# Patient Record
Sex: Female | Born: 1940
Health system: Southern US, Community
[De-identification: ages and names within clinical notes are randomized; demographics above are authoritative.]

## PROBLEM LIST (undated history)

## (undated) DIAGNOSIS — I350 Nonrheumatic aortic (valve) stenosis: Secondary | ICD-10-CM

## (undated) DIAGNOSIS — E1169 Type 2 diabetes mellitus with other specified complication: Secondary | ICD-10-CM

## (undated) DIAGNOSIS — R609 Edema, unspecified: Secondary | ICD-10-CM

## (undated) DIAGNOSIS — Z853 Personal history of malignant neoplasm of breast: Secondary | ICD-10-CM

## (undated) DIAGNOSIS — J349 Unspecified disorder of nose and nasal sinuses: Secondary | ICD-10-CM

## (undated) DIAGNOSIS — J45909 Unspecified asthma, uncomplicated: Secondary | ICD-10-CM

## (undated) DIAGNOSIS — I050 Rheumatic mitral stenosis: Secondary | ICD-10-CM

## (undated) DIAGNOSIS — J449 Chronic obstructive pulmonary disease, unspecified: Secondary | ICD-10-CM

## (undated) DIAGNOSIS — E785 Hyperlipidemia, unspecified: Secondary | ICD-10-CM

## (undated) DIAGNOSIS — H9190 Unspecified hearing loss, unspecified ear: Secondary | ICD-10-CM

## (undated) DIAGNOSIS — M791 Myalgia, unspecified site: Secondary | ICD-10-CM

## (undated) DIAGNOSIS — I129 Hypertensive chronic kidney disease with stage 1 through stage 4 chronic kidney disease, or unspecified chronic kidney disease: Secondary | ICD-10-CM

## (undated) DIAGNOSIS — I5030 Unspecified diastolic (congestive) heart failure: Secondary | ICD-10-CM

## (undated) DIAGNOSIS — E1142 Type 2 diabetes mellitus with diabetic polyneuropathy: Secondary | ICD-10-CM

## (undated) DIAGNOSIS — E039 Hypothyroidism, unspecified: Secondary | ICD-10-CM

## (undated) DIAGNOSIS — E669 Obesity, unspecified: Secondary | ICD-10-CM

## (undated) DIAGNOSIS — I10 Essential (primary) hypertension: Secondary | ICD-10-CM

## (undated) DIAGNOSIS — N183 Hypertensive chronic kidney disease with stage 1 through stage 4 chronic kidney disease, or unspecified chronic kidney disease: Secondary | ICD-10-CM

## (undated) DIAGNOSIS — M1990 Unspecified osteoarthritis, unspecified site: Secondary | ICD-10-CM

## (undated) HISTORY — DX: Rheumatic mitral stenosis: I05.0

## (undated) HISTORY — DX: Nonrheumatic aortic (valve) stenosis: I35.0

## (undated) HISTORY — DX: Unspecified disorder of nose and nasal sinuses: J34.9

## (undated) HISTORY — DX: Unspecified hearing loss, unspecified ear: H91.90

## (undated) HISTORY — DX: Edema, unspecified: R60.9

## (undated) HISTORY — DX: Obesity, unspecified: E66.9

## (undated) HISTORY — DX: Type 2 diabetes mellitus with diabetic polyneuropathy: E11.42

## (undated) HISTORY — DX: Unspecified osteoarthritis, unspecified site: M19.90

## (undated) HISTORY — DX: Myalgia, unspecified site: M79.10

## (undated) HISTORY — DX: Hypertensive chronic kidney disease with stage 1 through stage 4 chronic kidney disease, or unspecified chronic kidney disease: N18.30

## (undated) HISTORY — DX: Hypertensive chronic kidney disease with stage 1 through stage 4 chronic kidney disease, or unspecified chronic kidney disease: I12.9

## (undated) HISTORY — DX: Morbid (severe) obesity due to excess calories: E66.01

## (undated) HISTORY — DX: Hyperlipidemia, unspecified: E78.5

## (undated) HISTORY — DX: Chronic obstructive pulmonary disease, unspecified: J44.9

## (undated) HISTORY — DX: Personal history of malignant neoplasm of breast: Z85.3

## (undated) HISTORY — DX: Essential (primary) hypertension: I10

## (undated) HISTORY — PX: TONSILLECTOMY: SUR1361

## (undated) HISTORY — DX: Unspecified diastolic (congestive) heart failure: I50.30

## (undated) HISTORY — PX: TOTAL KNEE ARTHROPLASTY: SHX125

## (undated) HISTORY — DX: Hypothyroidism, unspecified: E03.9

## (undated) HISTORY — DX: Type 2 diabetes mellitus with other specified complication: E11.69

## (undated) HISTORY — DX: Unspecified asthma, uncomplicated: J45.909

---

## 1978-09-30 HISTORY — PX: PARTIAL HYSTERECTOMY: SHX80

## 2001-12-30 ENCOUNTER — Ambulatory Visit (HOSPITAL_COMMUNITY): Admission: RE | Admit: 2001-12-30 | Discharge: 2001-12-30 | Payer: Self-pay | Admitting: Family Medicine

## 2001-12-30 ENCOUNTER — Encounter: Payer: Self-pay | Admitting: Family Medicine

## 2005-10-15 ENCOUNTER — Inpatient Hospital Stay (HOSPITAL_COMMUNITY): Admission: RE | Admit: 2005-10-15 | Discharge: 2005-10-18 | Payer: Self-pay | Admitting: Orthopedic Surgery

## 2009-08-15 ENCOUNTER — Ambulatory Visit: Payer: Self-pay | Admitting: Cardiovascular Disease

## 2009-08-21 ENCOUNTER — Telehealth: Payer: Self-pay | Admitting: Cardiovascular Disease

## 2009-08-22 ENCOUNTER — Inpatient Hospital Stay (HOSPITAL_COMMUNITY): Admission: RE | Admit: 2009-08-22 | Discharge: 2009-08-24 | Payer: Self-pay | Admitting: Orthopedic Surgery

## 2009-08-28 ENCOUNTER — Telehealth: Payer: Self-pay | Admitting: Cardiovascular Disease

## 2009-12-14 ENCOUNTER — Ambulatory Visit: Payer: Self-pay | Admitting: Cardiovascular Disease

## 2010-01-23 ENCOUNTER — Encounter: Admission: RE | Admit: 2010-01-23 | Discharge: 2010-01-23 | Payer: Self-pay | Admitting: Orthopedic Surgery

## 2010-06-01 ENCOUNTER — Encounter: Admission: RE | Admit: 2010-06-01 | Discharge: 2010-06-01 | Payer: Self-pay | Admitting: Orthopedic Surgery

## 2010-06-12 ENCOUNTER — Ambulatory Visit: Payer: Self-pay | Admitting: Cardiovascular Disease

## 2010-07-20 ENCOUNTER — Telehealth (INDEPENDENT_AMBULATORY_CARE_PROVIDER_SITE_OTHER): Payer: Self-pay | Admitting: *Deleted

## 2010-07-31 ENCOUNTER — Inpatient Hospital Stay (HOSPITAL_COMMUNITY): Admission: RE | Admit: 2010-07-31 | Discharge: 2010-08-03 | Payer: Self-pay | Admitting: Orthopedic Surgery

## 2010-11-01 NOTE — Progress Notes (Signed)
Summary: Records Request  Faxed OV & EKG to [facility] at [facility] ([phone]). Debby Freiberg  July 20, 2010 2:04 PM

## 2010-12-11 LAB — BASIC METABOLIC PANEL
BUN: 9 mg/dL (ref 6–23)
Calcium: 7.8 mg/dL — ABNORMAL LOW (ref 8.4–10.5)
Calcium: 7.9 mg/dL — ABNORMAL LOW (ref 8.4–10.5)
Creatinine, Ser: 0.91 mg/dL (ref 0.4–1.2)
GFR calc Af Amer: >60 mL/min (ref >60)
GFR calc non Af Amer: 56 mL/min — ABNORMAL LOW (ref >60)
GFR calc non Af Amer: >60 mL/min (ref >60)
Glucose, Bld: 128 mg/dL — ABNORMAL HIGH (ref 70–99)
Glucose, Bld: 131 mg/dL — ABNORMAL HIGH (ref 70–99)
Sodium: 135 mEq/L (ref 135–145)

## 2010-12-11 LAB — URINALYSIS, ROUTINE W REFLEX MICROSCOPIC
Bilirubin Urine: NEGATIVE
Glucose, UA: NEGATIVE mg/dL
Hgb urine dipstick: NEGATIVE
Specific Gravity, Urine: 1.025 (ref 1.005–1.030)
Urobilinogen, UA: 0.2 mg/dL (ref 0.0–1.0)
pH: 5 (ref 5.0–8.0)

## 2010-12-11 LAB — URINE MICROSCOPIC-ADD ON

## 2010-12-11 LAB — CBC
Hemoglobin: 10.6 g/dL — ABNORMAL LOW (ref 12.0–15.0)
MCH: 32.9 pg (ref 26.0–34.0)
MCHC: 33.9 g/dL (ref 30.0–36.0)
MCHC: 34.3 g/dL (ref 30.0–36.0)
Platelets: 152 10*3/uL (ref 150–400)
RDW: 13.1 % (ref 11.5–15.5)

## 2010-12-11 LAB — TYPE AND SCREEN
ABO/RH(D): A POS
Antibody Screen: NEGATIVE

## 2010-12-11 LAB — URINE CULTURE: Special Requests: NEGATIVE

## 2010-12-12 LAB — URINALYSIS, ROUTINE W REFLEX MICROSCOPIC
Bilirubin Urine: NEGATIVE
Glucose, UA: NEGATIVE mg/dL
Ketones, ur: NEGATIVE mg/dL
Protein, ur: NEGATIVE mg/dL
pH: 6 (ref 5.0–8.0)

## 2010-12-12 LAB — CBC
MCHC: 33.7 g/dL (ref 30.0–36.0)
Platelets: 231 10*3/uL (ref 150–400)
RDW: 13.4 % (ref 11.5–15.5)
WBC: 5.9 10*3/uL (ref 4.0–10.5)

## 2010-12-12 LAB — APTT: aPTT: 32 seconds (ref 24–37)

## 2010-12-12 LAB — URINE MICROSCOPIC-ADD ON

## 2010-12-12 LAB — DIFFERENTIAL
Basophils Absolute: 0.1 10*3/uL (ref 0.0–0.1)
Basophils Relative: 1 % (ref 0–1)
Lymphocytes Relative: 38 % (ref 12–46)
Monocytes Absolute: 0.7 10*3/uL (ref 0.1–1.0)
Neutro Abs: 2.5 10*3/uL (ref 1.7–7.7)

## 2010-12-12 LAB — BASIC METABOLIC PANEL
BUN: 18 mg/dL (ref 6–23)
Calcium: 9.2 mg/dL (ref 8.4–10.5)
Creatinine, Ser: 1.16 mg/dL (ref 0.4–1.2)
GFR calc non Af Amer: 47 mL/min — ABNORMAL LOW (ref >60)
Glucose, Bld: 140 mg/dL — ABNORMAL HIGH (ref 70–99)

## 2010-12-12 LAB — SURGICAL PCR SCREEN: MRSA, PCR: NEGATIVE

## 2010-12-12 LAB — PROTIME-INR: Prothrombin Time: 13.3 seconds (ref 11.6–15.2)

## 2011-01-02 LAB — URINALYSIS, ROUTINE W REFLEX MICROSCOPIC
Bilirubin Urine: NEGATIVE
Glucose, UA: NEGATIVE mg/dL
Hgb urine dipstick: NEGATIVE
Ketones, ur: NEGATIVE mg/dL
Protein, ur: NEGATIVE mg/dL
Urobilinogen, UA: 0.2 mg/dL (ref 0.0–1.0)

## 2011-01-02 LAB — TYPE AND SCREEN

## 2011-01-02 LAB — CBC
HCT: 32.9 % — ABNORMAL LOW (ref 36.0–46.0)
HCT: 34.5 % — ABNORMAL LOW (ref 36.0–46.0)
Hemoglobin: 11.4 g/dL — ABNORMAL LOW (ref 12.0–15.0)
MCHC: 34 g/dL (ref 30.0–36.0)
MCV: 97.5 fL (ref 78.0–100.0)
MCV: 97.6 fL (ref 78.0–100.0)
Platelets: 155 10*3/uL (ref 150–400)
RBC: 3.54 MIL/uL — ABNORMAL LOW (ref 3.87–5.11)
RDW: 12.6 % (ref 11.5–15.5)
WBC: 8.1 10*3/uL (ref 4.0–10.5)

## 2011-01-02 LAB — BASIC METABOLIC PANEL
BUN: 7 mg/dL (ref 6–23)
CO2: 28 mEq/L (ref 19–32)
Chloride: 100 mEq/L (ref 96–112)
Chloride: 101 mEq/L (ref 96–112)
Creatinine, Ser: 0.8 mg/dL (ref 0.4–1.2)
GFR calc Af Amer: >60 mL/min (ref >60)
Glucose, Bld: 133 mg/dL — ABNORMAL HIGH (ref 70–99)
Potassium: 4 mEq/L (ref 3.5–5.1)
Potassium: 4.3 mEq/L (ref 3.5–5.1)
Sodium: 135 mEq/L (ref 135–145)

## 2011-01-02 LAB — PROTIME-INR
INR: 1.01 (ref 0.00–1.49)
Prothrombin Time: 13.2 seconds (ref 11.6–15.2)

## 2011-02-07 HISTORY — PX: TOTAL KNEE ARTHROPLASTY: SHX125

## 2011-02-12 NOTE — Assessment & Plan Note (Signed)
[facility]                        [place] [facility] OFFICE NOTE   NAME:Hoard, [name] [name]                   MRN:          [id number]  DATE:12/14/2009                            DOB:          [birth year]    PROBLEM LIST:  1. Hypertension.  2. Hyperlipidemia.  3. Obesity.  4. Arthritis status post bilateral knee arthroplasties.   INTERVAL HISTORY:  The patient states since her last visit, she has  gotten along fairly well.  She has developed some right hip pain and has  been told she may need a hip replacement; however, they are trying  injections first.  She denies any chest pain or chest discomfort.  She  has been compliant with her blood pressure medicines and has been  attempting to make dietary modifications, although she states she has  gained back some of the weight that she initially lost.  She has not  been taking any antiinflammatory medications as she is concerned about  the fact it may have on her stomach.  She takes Tylenol p.r.n. for pain.   PHYSICAL EXAMINATION:  VITAL SIGNS:  Today, her blood pressure is  143/81, pulse is 77, saturating 96% on room air.  She weighs 204 pounds  which is 9 pounds more than she weighed in December and the same that  she weighed in November.  GENERAL:  She is in no acute distress.  HEENT:  Normocephalic, atraumatic.  NECK:  Supple.  There is no JVD in the seated position.  There are no  carotid bruits.  HEART:  Regular rate and rhythm without murmur, rub, or gallop.  LUNGS:  Clear bilaterally.  EXTREMITIES:  Trace bilateral edema.  SKIN:  Warm and dry.   The review of the patient's labs; her BMP is completely within normal  limits including a sodium of 4.0.  Her lipid profile; a total  cholesterol of 229, HDL of 46, triglycerides 197, LDL 143.   ASSESSMENT AND PLAN:  1. Hypertension.  The patient's blood pressure is borderline today.      She should continue on her current dose of lisinopril  and      hydrochlorothiazide.  She signed up for [facility] course of plans on      taking a water aerobics starting in the very near future.  We      encouraged her to increase physical activity and attempt to      decrease weight loss.  If her blood pressure remains elevated in      the coming months, we would increase lisinopril/hydrochlorothiazide      that she is taking.  2. Hyperlipidemia.  Her LDL has greatly improved from 180 to 143 with      dietary modification.  I can gradually ask her to continue with      dietary modification.  The patient is verge to taking any medicines      that are not absolutely needed and there is no clear indication for      statin therapy at this time.  3. Other health maintenance issues.  The patient has not had a  mammography, screening for colon cancer, or a TSH.  We again      recommend to see her primary care physician.  She states she will      attempt to contact Dr. Abner Greenspan for this.  In the interim, the      patient may      take aspirin 81 mg daily and continue on the      lisinopril/hydrochlorothiazide at 20/12.5 mg daily.  As she plans      to follow up with primary care in the upcoming months, we will see      her back in 6 months unless problem were to arise in the interim.     Brayton El, MD  Electronically Signed    SGA/MedQ  DD: 12/14/2009  DT: 12/15/2009  Job #: [id number]

## 2011-02-12 NOTE — Assessment & Plan Note (Signed)
[facility]                        [place] [facility] OFFICE NOTE   NAME:[name], [name] STRAHM                   MRN:          [id number]  DATE:06/12/2010                            DOB:          05/02/1941    PROBLEMS LIST:  1. Abnormal ECG with hypertensive heart disease.  2. Hypertension.  3. Hyperlipidemia.  4. Obesity.  5. Arthritis status post bilateral knee arthroplasties.   INTERVAL HISTORY:  The patient was seen in the past by Dr. Freida [name] for  preoperative cardiovascular evaluation and management of her  hypertension.  She was started on lisinopril, hydrochlorothiazide with  improvement in her blood pressure.  She had an echocardiogram done which  showed normal LV systolic function, mild left ventricular hypertrophy,  and mild diastolic dysfunction.  The patient has been doing very well  from a cardiac standpoint with no reported chest pain, dyspnea,  palpitations, dizziness, presyncope, or syncope.  Her blood pressure was  running slightly high during the last visit.  The patient has not been  doing much physical exercise.   MEDICATIONS:  Include lisinopril/hydrochlorothiazide 20/12.5 mg once  daily.   ALLERGIES:  PENICILLIN.   PHYSICAL EXAMINATION:  VITAL SIGNS:  Weight is 205.6 pounds, blood  pressure is 154/86, pulse is 77, oxygen saturation is 95% on room air.  NECK:  Reveals no JVD or carotid bruits.  LUNGS:  Clear to auscultation.  HEART:  Regular rate and rhythm with no gallops or murmurs.  ABDOMEN:  Benign, nontender, nondistended.  EXTREMITIES:  No clubbing, cyanosis, or edema.   IMPRESSION:  1. Hypertension which seems to be not at target at this time.  Even      during her last visit, her blood pressure was borderline elevated.      Due to that, I recommended increasing the dose of      lisinopril/hydrochlorothiazide to 1 tablet twice daily.  The      patient otherwise is not having any chest pain or dyspnea or any  other cardiac symptoms.  2. Hyperlipidemia.  The patient is not on any medications, but      supposed to be following lifestyle changes.  Her most recent LDL in      March 2011 was 143, but she did have more recent labs done with Dr.      Yetta [name].  Given that, the patient is not having any active cardiac issues at this  time.  I asked her to follow  up with her primary care physician, Dr. Yetta [name].  She can follow up with  [place] as needed.  I will be more than happy to see her again for any other  active medical or cardiac issue.     Lorine Bears, MD  Electronically Signed    MA/MedQ  DD: 06/12/2010  DT: 06/13/2010  Job #: [id number]

## 2011-02-12 NOTE — Letter (Signed)
August 21, 2009    Madlyn Frankel. Charlann Boxer, M.D.  [facility]  [address]  [address]  [place], [place] [address]   RE:  [name] [name], [name] [name]  MRN:  [id number]  /  DOB:  Dec 13, 1940   Dear Dr. Charlann Boxer:   I had the pleasure of seeing your patient, [name] [name], in  cardiology clinic for preoperative evaluation.  We have performed an  echocardiogram which shows that her ejection fraction is within normal  limits without any significant valvular abnormalities.  We have also  started her on antihypertensive therapy.  I feel that she is at low risk  to proceed with orthopedic surgery on November 23.  Please contact my  office if I can be of any further assistance.   Sincerely,    Sincerely,      Brayton El, MD  Electronically Signed    SGA/MedQ  DD: 08/21/2009  DT: 08/21/2009  Job #: [id number]

## 2011-02-12 NOTE — Assessment & Plan Note (Signed)
HEALTHCARE                        [place] CARDIOLOGY OFFICE NOTE   NAME:[name], [name] [name] [name]                   MRN:          [id number]  DATE:08/15/2009                            DOB:          [birth year]    CHIEF COMPLAINT:  Preop evaluation for knee arthroplasty.   HISTORY OF PRESENT ILLNESS:  Ms. [name] [name] is a 70 year old white female  with past medical history significant for asthma as a child and vertigo  that has not reoccurred and a recent left femur fracture that has  worsened her left knee pain to the point where she is now requiring left  knee arthroplasty.  The patient states that prior to her femur fracture  several months ago, she was pretty active.  She has worked in a bakery  at [employer] and does significant amount of physical activity on a daily  basis.  She denies any episodes of chest discomfort, dyspnea on exertion  or lower extremity edema.  She does endorse fatigue, but this has been  chronic in nature.  Since her leg injury, the patient has not been as  active, but also has not had any other new symptoms other than the leg  pain.  The patient states that she takes Aleve and Advil for the leg  pain.   PAST MEDICAL HISTORY:  As above in HPI.   SOCIAL HISTORY:  No tobacco, no alcohol.   FAMILY HISTORY:  Negative for premature coronary artery disease.   ALLERGIES:  PENICILLIN causes swelling and hives.   MEDICATIONS:  Advil and Aleve p.r.n.  She also takes Cloro-Trimeton for  allergies.   REVIEW OF SYSTEMS:  As in HPI.  In addition, the patient endorses  chronic left lower extremity pain.  She also awakened several times at  night to pass urine.   HISTORY OF PRESENT ILLNESS:  Otherwise negative.   PHYSICAL EXAMINATION:  VITAL SIGNS:  Blood pressure 157/85, pulse 79,  she weighs 204 pounds, she is sating 95% on room air.  GENERAL:  No acute distress.  HEENT:  Nonfocal.  Normocephalic, atraumatic.  NECK:  Supple.  No JVD.   No carotid bruits.  HEART:  Regular rate and rhythm without murmur, rub or gallop.  LUNGS:  Clear bilaterally.  ABDOMEN:  Soft, nontender, nondistended.  There are no bruits  auscultated.  EXTREMITIES:  Without edema.  SKIN:  Warm and dry.  NEURO:  Nonfocal.  PSYCHIATRIC:  The patient is appropriate with normal levels of insight.   EKG taken today independently interpreted by myself demonstrates normal  sinus rhythm with left ventricular hypertrophy and ST and T-wave  abnormalities, most likely secondary to left ventricular hypertrophy.  There is also nonspecific interventricular conduction delay that may  also be secondary to left ventricular hypertrophy.  There is also  evidence of left atrial enlargement.   ASSESSMENT:  A 70 year old white female with uncontrolled hypertension  and left ventricular hypertrophy who is not having any symptoms  consistent with obstructive coronary artery disease.  The patient's  uncontrolled hypertension has likely precipitated left ventricular  hypertrophy.   PLAN:  Today, we will order a transthoracic echocardiogram  in order to  evaluate the patient's left ventricular systolic function and to confirm  the left ventricular hypertrophy.  We will place her on HCTZ/lisinopril  12.5/20 mg daily.  We will check a CMP, CBC and fasting lipids today.  I  will follow up with the patient later in the week to reevaluate her  blood pressure.  Should her blood pressure be within an acceptable range  and her ejection fraction be normal, she could proceed with her surgery  which is currently scheduled for the 23 November and be at low  cardiovascular risk for the surgery.  If the ejection fraction is  abnormal, an ischemia evaluation would be in order.      Brayton El, MD  Electronically Signed    SGA/MedQ  DD: 08/15/2009  DT: 08/16/2009  Job #: [id number] [id number]

## 2011-02-15 NOTE — Assessment & Plan Note (Signed)
[facility]                        [place] [facility]IOLOG[facility] OFFICE NOTE   NAME:[name], [name]                   MRN:          [id number]  DATE:09/13/2009                            DOB:          18-Apr-1941    PROBLEM LIST:  1. Hypertension.  2. Hyperlipidemia.  3. Arthritis, status post bilateral knee arthroplasties.   INTERVAL HISTORY:  The patient states since her last visit she had her  left knee operated on.  The surgery was uneventful on her postop course,  has not had any major complications.  The patient does endorse a  significant discomfort in that left knee, but states that it is getting  better and is fairly well controlled with pain medicine.  She is  prescribed.  She also states that the knee has been warm for quite  sometime, but her physicians are aware of it and she has been afebrile.  She has been compliant with her medications and is having her blood  pressure checked on the daily basis at home.  She states that a systolic  number is usually in the 130s and sometimes in the 140s and the  diastolic murmur is usually in the 70s or 80s.  The patient currently  does not have a primary care physician, but plans on seeking one now.   REVIEW OF SYSTEMS:  As above.   PHYSICAL EXAMINATION:  VITAL SIGNS:  Blood pressure 136/75, pulse 81,  weight 195 pounds, sating 96% on room air.  GENERAL:  She is in no acute distress.  HEENT:  Normocephalic, atraumatic.  HEART:  Regular rate and rhythm without murmur, rub, or gallop.  LUNGS:  Clear bilaterally.  ABDOMEN:  Soft, nontender, nondistended.  EXTREMITIES:  Without edema.  Left knee is mildly swollen with mild  erythema and warmth it appears to be full range of motion of the left  knee.  I reviewed the patient's echocardiogram since her last visit  which showed an ejection fraction of 55-60% with a mild tricuspid  regurgitation and evidence of impaired diastolic relaxation.   LABORATORY  DATA:  Reviewed since last visit, sodium 140, potassium 4.5,  chloride 101, CO2 30, BUN 14, creatinine 0.77, glucose 107.  Her LFTs  were within normal limits.  Total cholesterol 265, HDL 47, triglycerides  193, LDL 180.  CBC within normal limits.  The white count is 7,  hemoglobin is 16, hematocrit of 46.5, and a platelet count of 203.   ASSESSMENT AND PLAN:  1. Hypertension.  The patient's blood pressure is under good control.      We will continue lisinopril/HCTZ 20/12.5 mg daily.  2. Hyperlipidemia.  The patient's LDL is not at goal.  The patient      states she has lost 10 pounds in the last month and will continue      to make dietary improvements.  We will not institute medical      therapy at this time, but several recheck a fasting lipid profile      at her next office appointment in approximately 3 months.  3. Elevated glucose.  Her fasting glucose was 107 placing  her in the      prediabetic range.  We recommend dietary modification and for her      to seek at a primary care Brizeyda Holtmeyer for continued follow up of the      elevated glucose.  4. Other health maintenance issues.  The patient has not had a recent      mammography, screening for colon CA, or TSH.  The patient states      that she will make an appointment with primary care physician      regarding these issues.  We will see the patient back in 3 months'      time to further address her hyperlipidemia and monitor her blood      pressure.     Brayton El, MD  Electronically Signed    SGA/MedQ  DD: 09/13/2009  DT: 09/14/2009  Job #: [id number]

## 2011-02-15 NOTE — Discharge Summary (Signed)
[name] [name], [name] [name]            ACCOUNT NO.:  [id number]   MEDICAL RECORD NO.:  [id number]           PATIENT TYPE:   LOCATION:  1519                         FACILITY[facility]  [facility]   PHYSICIAN:  Madlyn Frankel. Charlann Boxer, M.D.  DATE OF BIRTH:  [birth year]   DATE OF ADMISSION:  10/15/2005  DATE OF DISCHARGE:  10/18/2005                                 DISCHARGE SUMMARY   ADMISSION DIAGNOSES:  1.  Severe osteoarthritis of the right knee.  2.  Hypothyroidism.  3.  Hypertension.  4.  History of asthma.  5.  Reflux.   DISCHARGE DIAGNOSES:  1.  Severe osteoarthritis of the right knee.  2.  Hypothyroidism.  3.  Hypertension.  4.  History of asthma.  5.  Reflux.  6.  Mild postoperative anemia.   OPERATION:  On October 15, 2005 the patient underwent right total knee  replacement arthroplasty utilizing DePuy rotating platform with polyethylene  posterior stabilized liner and patella button, all 3 components cemented,  Dooley L. Idolina Primer, P.A. assisted.   BRIEF HISTORY:  This 70 year old lady had been seen by [place] for bilateral knee  pain secondary to end-stage osteoarthritis.  Conservative measures with  alternating weightbearing as well as anti-inflammatories and pain  medications have not helped her discomfort.  After much discussion including  the risks and benefits of surgery and considering the findings on x-ray with  deterioration of the joint, it was decided to go ahead with total knee  replacement arthroplasty on the right knee as it is more symptomatic.  We  plan to do a left knee in the not too distant future.   COURSE IN THE HOSPITAL:  The patient tolerated the surgical procedure quite  well.  She entered into the total knee protocol with eagerness.  We were  able to wean her off the PCA nearly immediately using p.o. analgesics for  her discomfort.  She was placed on Coumadin protocol postoperatively for the  prevention of DVT.  She was ambulating in the hall having minimal  discomfort,  tolerating weightbearing with a walker quite well.   It was noted by the staff and we were contacted that the patient had  hypertensive episode on the evening of October 17, 2005.  Blood pressure was  up to 173/91, 207/86.  Postoperatively, the blood pressure was slightly  elevated at 145/83 and this was considered an increase in her blood pressure  to the point where treatment was necessary.  We started her on  hydrochlorothiazide 25 mg p.o. daily.  One dose brought her blood pressure  down to 181/88 and 164/88.  It was felt that she needed follow up concerning  her hypertension.  The patient stated that she had seen Dr. Tawanna Sat of  [facility] in [place], [place].  We recommend that she follow  up with him in the not too distant future.  We will place her on  hydrochlorothiazide daily.  This is to be along with her Norvasc as well.   On the day of discharge, the patient was awake, alert, and ambulating in the  hall.  Her wound was dry.  Neurovascular  was intact in the right lower  extremity .  Calf was soft.  She was eager to go home, eager to begin her  home health with continuous incentive spirometer.   LABORATORY VALUES:  In the hospital, hematologically showed a CBC  preoperatively completely within normal limits.  Hemoglobin of 13.9,  hematocrit was 41.1.  Final hemoglobin was 10.9 with hematocrit 31.5.  Differential was normal.  Blood chemistries were normal.  The urinalysis was  essentially negative for a urinary tract infection.  Chest x-ray showed no  active disease and electrocardiogram showed normal sinus rhythm with  possible left atrial enlargement.   CONDITION ON DISCHARGE:  Improved, stable.   PLAN:  The patient is discharged to her home to continue with home physical  therapy.  She may weightbear as tolerated in the operative extremity.  She  is placed on Vicodin for discomfort, Robaxin for muscle relaxant, Coumadin  for anticoagulation therapy.  For  revision of DVT for 4 weeks after date of  surgery.  We will start her on hydrochlorothiazide 25 mg one daily.  This is  a 1 time only prescription of #30.  She is to followup with Dr. Tawanna Sat as  indicated above.  I recommend her do this in the next 2 weeks.  All of the  above was discussed with the patient.  She is to return to Dr. Charlann Boxer 2 weeks  after date of surgery and we recommend that she call should she have any  problems or questions, continue with incentive spirometer at home every hour  from 7 a.m. to 10 p.m.      Dooley L. Cherlynn June.      Madlyn Frankel Charlann Boxer, M.D.  Electronically Signed    DLU/MEDQ  D:  10/18/2005  T:  10/18/2005  Job:  [id number]   cc:   Dr. Patrica Duel  [place]

## 2011-02-15 NOTE — Op Note (Signed)
[name] [name], [name] [name]            ACCOUNT NO.:  [id number]   MEDICAL RECORD NO.:  [id number]          PATIENT TYPE:  INP   LOCATION:  X007                         FACILITY:  [facility]   PHYSICIAN:  Madlyn Frankel. Charlann Boxer, M.D.  DATE OF BIRTH:  [birth year]   DATE OF PROCEDURE:  10/15/2005  DATE OF DISCHARGE:                                 OPERATIVE REPORT   PREOPERATIVE DIAGNOSIS:  Right knee end-stage osteoarthritis.   POSTOPERATIVE DIAGNOSIS:  Right knee end-stage osteoarthritis.   PROCEDURE:  Right total knee replacement.   COMPONENTS USED:  DePuy rotating platform, Sigma knee with a size 3 femur,  size 2 tibia, size 3 x 12.5 mm polyethylene posterior stabilized liner, and  a 35 patellar button.   SURGEON:  Madlyn Frankel. Charlann Boxer, M.D.   ASSISTANTDruscilla [name]. Idolina Primer, PA-C.   ANESTHESIA:  General.   BLOOD LOSS:  Minimal.   TOURNIQUET TIME:  Sixty minutes at 300 mmHg.   DRAINS:  One.   COMPLICATIONS:  None.   INDICATIONS FOR PROCEDURE:  [name] [name] is a 70 year old female who I have  been following in the office for some time now for bilateral knee  osteoarthritis.  She at this point has determined that she failed  conservative measures, was not getting any significant long-term relief.  Her quality of life was effected, based on her knee pain.  She does have  bilateral knee arthritis, but the right knee is worse than the left.  We  discussed staged bilateral total knee replacements.   Risks and benefits of component failure, dislocation, infection, DVT, were  all reviewed in this 70 year old female.  Consent was obtained.   PROCEDURE IN DETAIL:  Patient was brought to the operating theater.  Once  adequate anesthesia and preoperative antibiotics with 600 mg of IV  clindamycin were given, the patient was positioned supine with the proximal  thigh tourniquet placed on the right thigh.  The right lower extremity was  then prepped and draped in a sterile fashion following a  prescrub.  A  midline incision was made followed by a median parapatellar arthrotomy,  which was modified.  The patella was not everted during the majority of the  case other than patellar cut following decompression of the joint with bone  cuts.   Knee exposure was obtained in a routine fashion.  Patient was noted to have  a relatively tight knee with adhesions and fibrosis due to limited range of  motion from her arthritis.  Following knee exposure, tension was first  directed to the femur.  Intramedullary guide was placed, and 10 mm of her  bone was resected off of the distal femur.  I then sized the femur to be a  size 3.  The anterior, posterior, and chamfer cuts were then made with a  standard MIS guide.  Following this, the trochlear notch cut was then made,  based off the lateral aspect of the medial femoral condyle.  Following this,  attention was directed to the tibia.   Tibial exposure was obtained in a routine fashion following meniscectomy.  I  resected 10 mm of  bone off the lateral tibia, as it was unaffected.  This  amounted to about a 4 mm cut on the medial side.   This further bone was removed off the posterior aspect of the femur as well  as the posterior aspect of the tibia.  Once this was carried out, a trial  reduction was carried out with a size 3 femur and size 2 tibial tray with a  10 poly insert.  Felt the knee was coming into a slight hyperextension, so  we trialed a 12.5.  I was happy with this.  At this point, the trial  components were removed.  Final tibial preparation was carried out with  drilling and keel punch.  At this point, final components were brought to  the field.  I then injected the knee with a combination of 0.5% Marcaine, 1  cc of 1:1000 epinephrine and 1 cc of 30 mg Toradol.  Following knee  irrigation, the components were cemented into position with the tibia first  and femur and patella.   A 12.5 spacer was placed with the knee in  extension to allow the cement to  cure.  Excess cement was removed.  A final 12.5 poly inserted.  The knee  came to full extension and was stable without ligamentous instability and  extension through flexion.  The patella tracked without any application of  pressure.   At this point, a medium Hemovac drain was placed deep and the wound was  copiously irrigated with pulsatile lavage irrigation.  The wound was closed  in layers with the knee in extension with #1 PDS followed by 2-0 Vicryl and  running 4-0 Monocryl on the skin.  The patient was then extubated and  brought to the recovery room in stable condition.      Madlyn Frankel Charlann Boxer, M.D.  Electronically Signed     MDO/MEDQ  D:  10/15/2005  T:  10/15/2005  Job:  [id number]

## 2011-02-15 NOTE — H&P (Signed)
[name] [name], [name] [name]            ACCOUNT NO.:  [id number]   MEDICAL RECORD NO.:  [id number]          PATIENT TYPE:  INP   LOCATION:  NA                           FACILITY:  [facility] ([facility])   PHYSICIAN:  Madlyn Frankel. Charlann Boxer, M.D.  DATE OF BIRTH:  1941-08-09   DATE OF ADMISSION:  10/15/2005  DATE OF DISCHARGE:                                HISTORY & PHYSICAL   CHIEF DIAGNOSIS:  Bilateral knee osteoarthritis, right worse than left.   SECONDARY DIAGNOSES:  1.  Asthma.  2.  Hypertension.  3.  Osteoarthritis.   HISTORY OF PRESENT ILLNESS:  [name] [name] is a 70 year old female who I  have been following for some time now for bilateral knee pain, right worse  than left.  [name] [name] has failed conservative management of cortisone injections,  anti-inflammatories, exercise, and viscosupplementation.  Based on the  persistence of her symptoms, decreasing quality of life, inability to  perform activities [name] [name] wishes to do, [name] [name] has chosen to proceed with total  knee replacement surgery.   PAST MEDICAL HISTORY:  1.  Hypertension.  2.  History of asthma.  3.  Arthritis.   PAST SURGICAL HISTORY:  Knee arthroscopy on the right.  [name] [name] had had this  performed in 2004 by Dr. Montez Morita.   CURRENT MEDICATIONS:  1.  Norvasc 5 mg daily.  2.  Lamisil.  3.  Synthroid.  4.  Celebrex.   ALLERGIES:  PENICILLIN.   FAMILY HISTORY:  Diabetes, breast cancer, arthritis.   SOCIAL HISTORY:  [name] [name] is currently employed at [employer]. [name] [name] denies  smoking or alcohol use.  Her primary care physician is Dr. ? .   REVIEW OF SYSTEMS:  Otherwise negative for any upper respiratory and  pulmonary, cardiac, gastrointestinal, genitourinary for the last two weeks  other than that noted in the HPI.   PHYSICAL EXAMINATION:  GENERAL:  On October 09, 2005, in no acute distress.  VITAL SIGNS: Temperature 98.3, pulse 72, blood pressure 130/76.  HEENT:  Normal with no evidence of any asymmetry.  [name] [name] had normal speech,  normal ocular  motion.  NECK:  Supple with no nodes detected. No bruits.  CHEST:  Clear to auscultation.  No wheezing.  HEART:  Regular rate and rhythm.  No murmurs.  ABDOMEN:  Soft, nontender.  With positive bowel sounds.  EXTREMITIES:  Examination of her lower extremities revealed [name] [name] has no  significant swelling on both knees.  [name] [name] has normal alignment with intact  ligaments.  [name] [name] does have near full extension on the right and left with  flexion of 120 degrees with painful crepitation.  Otherwise neurovascularly  stable.  No signs of any cellulitis, overlying psoriasis.  [name] [name] otherwise  neurovascularly intact.   LABORATORY DATA:  Radiographs revealed bilateral knee osteoarthritis, right  worse than left.   IMPRESSION:  Right knee end-stage osteoarthritis.  Failed conservative  measures.   PLAN:  At this point [name] [name] was set up for right total knee  replacement on October 15, 2005.  Extensive discussion of the risks and  benefits was performed in the office on October 09, 2005 for consent to be  obtained.  Questions were encouraged, answered and reviewed.      Madlyn Frankel Charlann Boxer, M.D.  Electronically Signed     MDO/MEDQ  D:  10/13/2005  T:  10/14/2005  Job:  [id number]

## 2011-03-26 ENCOUNTER — Encounter: Payer: Self-pay | Admitting: Cardiovascular Disease

## 2011-05-23 ENCOUNTER — Encounter: Payer: Self-pay | Admitting: Cardiovascular Disease

## 2011-09-11 ENCOUNTER — Encounter: Payer: Self-pay | Admitting: Cardiovascular Disease

## 2013-09-30 HISTORY — PX: MASTECTOMY: SHX3

## 2013-11-11 ENCOUNTER — Ambulatory Visit (INDEPENDENT_AMBULATORY_CARE_PROVIDER_SITE_OTHER): Payer: Medicare HMO

## 2013-11-11 VITALS — BP 136/87 | HR 72 | Resp 18

## 2013-11-11 DIAGNOSIS — L600 Ingrowing nail: Secondary | ICD-10-CM

## 2013-11-11 DIAGNOSIS — L03039 Cellulitis of unspecified toe: Secondary | ICD-10-CM

## 2013-11-11 MED ORDER — CLINDAMYCIN HCL 150 MG PO CAPS: 150.0000 mg | ORAL_CAPSULE | Freq: Four times a day (QID) | ORAL | Status: DC

## 2013-11-11 NOTE — Patient Instructions (Signed)

## 2013-11-11 NOTE — Progress Notes (Signed)
   Subjective:    Patient ID: [name] [name], female    DOB: 1941-01-18, 73 y.o.   MRN: [id number]  HPI I have an ingrown toenail on left big toe and it is sore and tender and not draining and hurts with shoes and the right foot on top is swells up and cant wear shoes and it started thanksgiving 2014 and it looks bruised    Review of Systems  HENT: Positive for sinus pressure.        Ringing in ears  Respiratory: Positive for wheezing.   Cardiovascular: Positive for leg swelling.  Musculoskeletal:       Joint and muscle pain  All other systems reviewed and are negative.       Objective:   Physical Exam Masker status is intact with pedal pulses palpable DP post-ORIF 4 bilateral PT one over 4 bilateral Refill timed 3-4 seconds all digits skin temperature warm turgor normal no edema rubor pallor or varicosities progress is edema and erythema over the left foot the correction right foot second third metatarsal area and along the lateral calcaneal cuboid and Lisfranc's area. This was a problem she's having some flareups arthritis of the last several months 80 gouty arthropathy etc. may consider further evaluation of this and possible followup x-rays if he continues to followup visit next 3 weeks. Dermatologically nails thick brittle criptotic incurvated most significant is ingrowing left great toenail lateral border was show some granulation tissue professional lateral nail fold medial nail border is unremarkable orthopedic biomechanical exam otherwise unremarkable rectus foot type mild digital contractures noted again edema and tenderness right forefoot noted       Assessment & Plan:  Assessment this time is paronychia with ingrowing nail left hallux nail lateral border painful tender symptomatic at this time local anesthetic blocks Mr. Betadine prep performed the lateral borders excised the only should be followed by alcohol wash Betadine ointment and dressed a dressing being applied.  Patient is given instructions for daily soaking in Betadine or Epsom salts in warm water. Prescription for clindamycin 150 mg 3 times a day x10 days dispensed also at this time pregnant, or ibuprofen as needed for pain  Recheck in 3 weeks for nail check and possible x-rays for evaluation of right foot.  Harriet Masson DPM

## 2014-02-14 HISTORY — PX: BACK SURGERY: SHX140

## 2014-04-27 DIAGNOSIS — D0511 Intraductal carcinoma in situ of right breast: Secondary | ICD-10-CM | POA: Insufficient documentation

## 2014-04-27 HISTORY — DX: Intraductal carcinoma in situ of right breast: D05.11

## 2014-10-11 DIAGNOSIS — J45909 Unspecified asthma, uncomplicated: Secondary | ICD-10-CM | POA: Diagnosis not present

## 2014-10-12 DIAGNOSIS — I10 Essential (primary) hypertension: Secondary | ICD-10-CM | POA: Diagnosis not present

## 2014-10-12 DIAGNOSIS — Z0000 Encounter for general adult medical examination without abnormal findings: Secondary | ICD-10-CM | POA: Diagnosis not present

## 2014-10-12 DIAGNOSIS — E782 Mixed hyperlipidemia: Secondary | ICD-10-CM | POA: Diagnosis not present

## 2014-10-12 DIAGNOSIS — E1165 Type 2 diabetes mellitus with hyperglycemia: Secondary | ICD-10-CM | POA: Diagnosis not present

## 2014-10-12 DIAGNOSIS — Z139 Encounter for screening, unspecified: Secondary | ICD-10-CM | POA: Diagnosis not present

## 2014-10-12 DIAGNOSIS — Z1389 Encounter for screening for other disorder: Secondary | ICD-10-CM | POA: Diagnosis not present

## 2014-11-11 DIAGNOSIS — Z853 Personal history of malignant neoplasm of breast: Secondary | ICD-10-CM | POA: Diagnosis not present

## 2014-11-11 DIAGNOSIS — J45909 Unspecified asthma, uncomplicated: Secondary | ICD-10-CM | POA: Diagnosis not present

## 2014-11-17 ENCOUNTER — Telehealth: Payer: Self-pay | Admitting: Genetic Counselor

## 2014-11-18 ENCOUNTER — Telehealth: Payer: Self-pay | Admitting: Genetic Counselor

## 2014-11-30 ENCOUNTER — Telehealth: Payer: Self-pay | Admitting: Genetic Counselor

## 2014-11-30 NOTE — Telephone Encounter (Signed)
PT'S DTR-INLAW AWARE OF GENETIC APPT. 12/19/14@1 :00

## 2014-12-01 ENCOUNTER — Other Ambulatory Visit: Payer: Self-pay

## 2014-12-10 DIAGNOSIS — J45909 Unspecified asthma, uncomplicated: Secondary | ICD-10-CM | POA: Diagnosis not present

## 2014-12-19 ENCOUNTER — Other Ambulatory Visit: Payer: Self-pay

## 2014-12-19 ENCOUNTER — Encounter: Payer: Self-pay | Admitting: Genetic Counselor

## 2015-01-10 DIAGNOSIS — J45909 Unspecified asthma, uncomplicated: Secondary | ICD-10-CM | POA: Diagnosis not present

## 2015-01-25 DIAGNOSIS — C50919 Malignant neoplasm of unspecified site of unspecified female breast: Secondary | ICD-10-CM | POA: Diagnosis not present

## 2015-02-09 DIAGNOSIS — J45909 Unspecified asthma, uncomplicated: Secondary | ICD-10-CM | POA: Diagnosis not present

## 2015-02-10 DIAGNOSIS — E1165 Type 2 diabetes mellitus with hyperglycemia: Secondary | ICD-10-CM | POA: Diagnosis not present

## 2015-02-10 DIAGNOSIS — I129 Hypertensive chronic kidney disease with stage 1 through stage 4 chronic kidney disease, or unspecified chronic kidney disease: Secondary | ICD-10-CM | POA: Diagnosis not present

## 2015-02-10 DIAGNOSIS — N182 Chronic kidney disease, stage 2 (mild): Secondary | ICD-10-CM | POA: Diagnosis not present

## 2015-02-10 DIAGNOSIS — E1140 Type 2 diabetes mellitus with diabetic neuropathy, unspecified: Secondary | ICD-10-CM | POA: Diagnosis not present

## 2015-02-10 DIAGNOSIS — E782 Mixed hyperlipidemia: Secondary | ICD-10-CM | POA: Diagnosis not present

## 2015-02-14 DIAGNOSIS — C50919 Malignant neoplasm of unspecified site of unspecified female breast: Secondary | ICD-10-CM | POA: Diagnosis not present

## 2015-03-08 DIAGNOSIS — Z6836 Body mass index (BMI) 36.0-36.9, adult: Secondary | ICD-10-CM | POA: Diagnosis not present

## 2015-03-08 DIAGNOSIS — M25551 Pain in right hip: Secondary | ICD-10-CM | POA: Diagnosis not present

## 2015-03-08 DIAGNOSIS — Z96641 Presence of right artificial hip joint: Secondary | ICD-10-CM | POA: Diagnosis not present

## 2015-03-08 DIAGNOSIS — M25559 Pain in unspecified hip: Secondary | ICD-10-CM | POA: Diagnosis not present

## 2015-03-08 DIAGNOSIS — M5430 Sciatica, unspecified side: Secondary | ICD-10-CM | POA: Diagnosis not present

## 2015-03-31 DIAGNOSIS — Z6836 Body mass index (BMI) 36.0-36.9, adult: Secondary | ICD-10-CM | POA: Diagnosis not present

## 2015-03-31 DIAGNOSIS — M5410 Radiculopathy, site unspecified: Secondary | ICD-10-CM | POA: Diagnosis not present

## 2015-03-31 DIAGNOSIS — C50919 Malignant neoplasm of unspecified site of unspecified female breast: Secondary | ICD-10-CM | POA: Diagnosis not present

## 2015-04-18 DIAGNOSIS — H2511 Age-related nuclear cataract, right eye: Secondary | ICD-10-CM | POA: Diagnosis not present

## 2015-04-24 DIAGNOSIS — H25811 Combined forms of age-related cataract, right eye: Secondary | ICD-10-CM | POA: Diagnosis not present

## 2015-04-24 DIAGNOSIS — H2511 Age-related nuclear cataract, right eye: Secondary | ICD-10-CM | POA: Diagnosis not present

## 2015-05-23 DIAGNOSIS — Z0000 Encounter for general adult medical examination without abnormal findings: Secondary | ICD-10-CM | POA: Diagnosis not present

## 2015-05-23 DIAGNOSIS — Z6837 Body mass index (BMI) 37.0-37.9, adult: Secondary | ICD-10-CM | POA: Diagnosis not present

## 2015-05-23 DIAGNOSIS — Z1210 Encounter for screening for malignant neoplasm of intestinal tract, unspecified: Secondary | ICD-10-CM | POA: Diagnosis not present

## 2015-05-23 DIAGNOSIS — Z1231 Encounter for screening mammogram for malignant neoplasm of breast: Secondary | ICD-10-CM | POA: Diagnosis not present

## 2015-06-16 DIAGNOSIS — Z1231 Encounter for screening mammogram for malignant neoplasm of breast: Secondary | ICD-10-CM | POA: Diagnosis not present

## 2015-06-16 DIAGNOSIS — Z853 Personal history of malignant neoplasm of breast: Secondary | ICD-10-CM | POA: Diagnosis not present

## 2015-06-16 DIAGNOSIS — Z803 Family history of malignant neoplasm of breast: Secondary | ICD-10-CM | POA: Diagnosis not present

## 2015-06-16 DIAGNOSIS — Z9011 Acquired absence of right breast and nipple: Secondary | ICD-10-CM | POA: Diagnosis not present

## 2015-06-21 DIAGNOSIS — E1165 Type 2 diabetes mellitus with hyperglycemia: Secondary | ICD-10-CM | POA: Diagnosis not present

## 2015-06-21 DIAGNOSIS — E782 Mixed hyperlipidemia: Secondary | ICD-10-CM | POA: Diagnosis not present

## 2015-06-21 DIAGNOSIS — I129 Hypertensive chronic kidney disease with stage 1 through stage 4 chronic kidney disease, or unspecified chronic kidney disease: Secondary | ICD-10-CM | POA: Diagnosis not present

## 2015-06-21 DIAGNOSIS — E1140 Type 2 diabetes mellitus with diabetic neuropathy, unspecified: Secondary | ICD-10-CM | POA: Diagnosis not present

## 2015-06-27 DIAGNOSIS — Z6834 Body mass index (BMI) 34.0-34.9, adult: Secondary | ICD-10-CM | POA: Diagnosis not present

## 2015-06-27 DIAGNOSIS — E669 Obesity, unspecified: Secondary | ICD-10-CM | POA: Diagnosis not present

## 2015-06-27 DIAGNOSIS — H2512 Age-related nuclear cataract, left eye: Secondary | ICD-10-CM | POA: Diagnosis not present

## 2015-06-27 DIAGNOSIS — I10 Essential (primary) hypertension: Secondary | ICD-10-CM | POA: Diagnosis not present

## 2015-06-27 DIAGNOSIS — J45909 Unspecified asthma, uncomplicated: Secondary | ICD-10-CM | POA: Diagnosis not present

## 2015-06-27 DIAGNOSIS — E785 Hyperlipidemia, unspecified: Secondary | ICD-10-CM | POA: Diagnosis not present

## 2015-06-27 DIAGNOSIS — E119 Type 2 diabetes mellitus without complications: Secondary | ICD-10-CM | POA: Diagnosis not present

## 2015-06-27 DIAGNOSIS — H25812 Combined forms of age-related cataract, left eye: Secondary | ICD-10-CM | POA: Diagnosis not present

## 2015-06-27 DIAGNOSIS — Z79899 Other long term (current) drug therapy: Secondary | ICD-10-CM | POA: Diagnosis not present

## 2015-07-10 DIAGNOSIS — N182 Chronic kidney disease, stage 2 (mild): Secondary | ICD-10-CM | POA: Diagnosis not present

## 2015-07-10 DIAGNOSIS — E1165 Type 2 diabetes mellitus with hyperglycemia: Secondary | ICD-10-CM | POA: Diagnosis not present

## 2015-07-10 DIAGNOSIS — I129 Hypertensive chronic kidney disease with stage 1 through stage 4 chronic kidney disease, or unspecified chronic kidney disease: Secondary | ICD-10-CM | POA: Diagnosis not present

## 2015-07-10 DIAGNOSIS — E1140 Type 2 diabetes mellitus with diabetic neuropathy, unspecified: Secondary | ICD-10-CM | POA: Diagnosis not present

## 2015-07-10 DIAGNOSIS — Z23 Encounter for immunization: Secondary | ICD-10-CM | POA: Diagnosis not present

## 2015-08-15 DIAGNOSIS — L918 Other hypertrophic disorders of the skin: Secondary | ICD-10-CM | POA: Diagnosis not present

## 2015-08-15 DIAGNOSIS — L989 Disorder of the skin and subcutaneous tissue, unspecified: Secondary | ICD-10-CM | POA: Diagnosis not present

## 2015-08-15 DIAGNOSIS — D229 Melanocytic nevi, unspecified: Secondary | ICD-10-CM | POA: Diagnosis not present

## 2015-08-15 DIAGNOSIS — Z6837 Body mass index (BMI) 37.0-37.9, adult: Secondary | ICD-10-CM | POA: Diagnosis not present

## 2015-10-05 ENCOUNTER — Other Ambulatory Visit: Payer: Self-pay

## 2015-10-05 NOTE — Patient Outreach (Signed)
[facility]) Care Management  10/05/2015  VERENA ARDIS 08/19/1941 [id number]   Telephone call to patient regarding [employer] high risk referral.  Unable to reach patient. HIPAA compliant voice message left with call back phone number.   PLAN; RNCM will attempt 2nd telephone outreach within 1 week.   Quinn Plowman RN,BSN,CCM [place] Coordinator [phone]

## 2015-10-09 ENCOUNTER — Other Ambulatory Visit: Payer: Self-pay

## 2015-10-09 NOTE — Patient Outreach (Signed)
[place] [facility]) Care Management  10/09/2015  [name] [name] [birth year] [id number]  Second telephone outreach to patient regarding [employer] high risk referral.  Unable to reach patient. HIPAA compliant voice message left with call back phone number.   PLAN: RNCM will attempt 3rd telephone outreach within 1 week.   Quinn Plowman RN,BSN,CCM [place] Coordinator [phone]

## 2015-10-11 ENCOUNTER — Ambulatory Visit: Payer: Self-pay

## 2015-10-13 ENCOUNTER — Other Ambulatory Visit: Payer: Self-pay

## 2015-10-13 DIAGNOSIS — Z803 Family history of malignant neoplasm of breast: Secondary | ICD-10-CM | POA: Diagnosis not present

## 2015-10-13 DIAGNOSIS — Z86000 Personal history of in-situ neoplasm of breast: Secondary | ICD-10-CM | POA: Diagnosis not present

## 2015-10-13 DIAGNOSIS — Z79810 Long term (current) use of selective estrogen receptor modulators (SERMs): Secondary | ICD-10-CM | POA: Diagnosis not present

## 2015-10-13 DIAGNOSIS — Z808 Family history of malignant neoplasm of other organs or systems: Secondary | ICD-10-CM | POA: Diagnosis not present

## 2015-10-13 DIAGNOSIS — Z853 Personal history of malignant neoplasm of breast: Secondary | ICD-10-CM | POA: Diagnosis not present

## 2015-10-13 NOTE — Patient Outreach (Signed)
[place] [facility]) Care Management  10/13/2015  [name] [name] 1941-07-06 [id number]   Third telephone call to patient regarding [employer] high risk referral.  Unable to reach patient. HIPAA compliant voice message left with call back phone number.  RNCM contacted Dr. Marco Collie office, spoke with [facility] requested assistance with engaging patient for [facility] care management services.  Message also left on referral/triage line regarding assistance needed to engage patient in [facility] care management services.  RNCM requested return call.   PLAN: RNCM will send patient outreach letter to attempt contact.   Quinn Plowman RN,BSN,CCM [place] Coordinator [phone]'

## 2015-11-06 ENCOUNTER — Other Ambulatory Visit: Payer: Self-pay

## 2015-11-06 NOTE — Patient Outreach (Signed)
[place] [facility]) Care Management  11/06/2015  [name] [name] Dec 02, 1940 [id number]   No response from patient after 3 phone calls and letter outreach attempt.    PLAN: RNCM will forward patient to Josepha Pigg to close due to inability to establish contact with patient. RNCM will notify patients primary MD of closure.  RNCM will request primary MD office assist with engagement of patient for [facility] care management services.  RNCM left message on nurse voice mail notifying of closure and requesting assistance with engaging patient to [facility] care management services.   Quinn Plowman RN,BSN,CCM [facility] Telephonic  [phone]

## 2015-11-08 DIAGNOSIS — E1140 Type 2 diabetes mellitus with diabetic neuropathy, unspecified: Secondary | ICD-10-CM | POA: Diagnosis not present

## 2015-11-08 DIAGNOSIS — E782 Mixed hyperlipidemia: Secondary | ICD-10-CM | POA: Diagnosis not present

## 2015-11-08 DIAGNOSIS — D0511 Intraductal carcinoma in situ of right breast: Secondary | ICD-10-CM | POA: Diagnosis not present

## 2015-11-08 DIAGNOSIS — I129 Hypertensive chronic kidney disease with stage 1 through stage 4 chronic kidney disease, or unspecified chronic kidney disease: Secondary | ICD-10-CM | POA: Diagnosis not present

## 2015-11-08 DIAGNOSIS — E1165 Type 2 diabetes mellitus with hyperglycemia: Secondary | ICD-10-CM | POA: Diagnosis not present

## 2015-11-15 DIAGNOSIS — Z1389 Encounter for screening for other disorder: Secondary | ICD-10-CM | POA: Diagnosis not present

## 2015-11-15 DIAGNOSIS — I129 Hypertensive chronic kidney disease with stage 1 through stage 4 chronic kidney disease, or unspecified chronic kidney disease: Secondary | ICD-10-CM | POA: Diagnosis not present

## 2015-11-15 DIAGNOSIS — E1140 Type 2 diabetes mellitus with diabetic neuropathy, unspecified: Secondary | ICD-10-CM | POA: Diagnosis not present

## 2015-11-15 DIAGNOSIS — E1165 Type 2 diabetes mellitus with hyperglycemia: Secondary | ICD-10-CM | POA: Diagnosis not present

## 2015-11-15 DIAGNOSIS — N182 Chronic kidney disease, stage 2 (mild): Secondary | ICD-10-CM | POA: Diagnosis not present

## 2015-11-15 DIAGNOSIS — Z139 Encounter for screening, unspecified: Secondary | ICD-10-CM | POA: Diagnosis not present

## 2015-11-15 DIAGNOSIS — Z0000 Encounter for general adult medical examination without abnormal findings: Secondary | ICD-10-CM | POA: Diagnosis not present

## 2015-11-23 DIAGNOSIS — K648 Other hemorrhoids: Secondary | ICD-10-CM | POA: Diagnosis not present

## 2015-12-14 DIAGNOSIS — D122 Benign neoplasm of ascending colon: Secondary | ICD-10-CM | POA: Diagnosis not present

## 2015-12-14 DIAGNOSIS — Z1211 Encounter for screening for malignant neoplasm of colon: Secondary | ICD-10-CM | POA: Diagnosis not present

## 2015-12-14 DIAGNOSIS — J45909 Unspecified asthma, uncomplicated: Secondary | ICD-10-CM | POA: Diagnosis not present

## 2015-12-14 DIAGNOSIS — Z86010 Personal history of colonic polyps: Secondary | ICD-10-CM | POA: Diagnosis not present

## 2015-12-14 DIAGNOSIS — Z9011 Acquired absence of right breast and nipple: Secondary | ICD-10-CM | POA: Diagnosis not present

## 2015-12-14 DIAGNOSIS — Z853 Personal history of malignant neoplasm of breast: Secondary | ICD-10-CM | POA: Diagnosis not present

## 2015-12-14 DIAGNOSIS — I10 Essential (primary) hypertension: Secondary | ICD-10-CM | POA: Diagnosis not present

## 2015-12-14 DIAGNOSIS — K635 Polyp of colon: Secondary | ICD-10-CM | POA: Diagnosis not present

## 2015-12-14 DIAGNOSIS — D126 Benign neoplasm of colon, unspecified: Secondary | ICD-10-CM | POA: Diagnosis not present

## 2015-12-14 DIAGNOSIS — K648 Other hemorrhoids: Secondary | ICD-10-CM | POA: Diagnosis not present

## 2016-01-30 DIAGNOSIS — E1165 Type 2 diabetes mellitus with hyperglycemia: Secondary | ICD-10-CM | POA: Diagnosis not present

## 2016-01-30 DIAGNOSIS — E782 Mixed hyperlipidemia: Secondary | ICD-10-CM | POA: Diagnosis not present

## 2016-01-30 DIAGNOSIS — E1140 Type 2 diabetes mellitus with diabetic neuropathy, unspecified: Secondary | ICD-10-CM | POA: Diagnosis not present

## 2016-01-30 DIAGNOSIS — I129 Hypertensive chronic kidney disease with stage 1 through stage 4 chronic kidney disease, or unspecified chronic kidney disease: Secondary | ICD-10-CM | POA: Diagnosis not present

## 2016-02-07 DIAGNOSIS — N182 Chronic kidney disease, stage 2 (mild): Secondary | ICD-10-CM | POA: Diagnosis not present

## 2016-02-07 DIAGNOSIS — Z6837 Body mass index (BMI) 37.0-37.9, adult: Secondary | ICD-10-CM | POA: Diagnosis not present

## 2016-02-07 DIAGNOSIS — E1165 Type 2 diabetes mellitus with hyperglycemia: Secondary | ICD-10-CM | POA: Diagnosis not present

## 2016-02-07 DIAGNOSIS — E1140 Type 2 diabetes mellitus with diabetic neuropathy, unspecified: Secondary | ICD-10-CM | POA: Diagnosis not present

## 2016-02-07 DIAGNOSIS — I129 Hypertensive chronic kidney disease with stage 1 through stage 4 chronic kidney disease, or unspecified chronic kidney disease: Secondary | ICD-10-CM | POA: Diagnosis not present

## 2016-02-13 DIAGNOSIS — E782 Mixed hyperlipidemia: Secondary | ICD-10-CM | POA: Diagnosis not present

## 2016-02-13 DIAGNOSIS — I129 Hypertensive chronic kidney disease with stage 1 through stage 4 chronic kidney disease, or unspecified chronic kidney disease: Secondary | ICD-10-CM | POA: Diagnosis not present

## 2016-02-13 DIAGNOSIS — E1140 Type 2 diabetes mellitus with diabetic neuropathy, unspecified: Secondary | ICD-10-CM | POA: Diagnosis not present

## 2016-02-13 DIAGNOSIS — N182 Chronic kidney disease, stage 2 (mild): Secondary | ICD-10-CM | POA: Diagnosis not present

## 2016-03-26 DIAGNOSIS — E1165 Type 2 diabetes mellitus with hyperglycemia: Secondary | ICD-10-CM | POA: Diagnosis not present

## 2016-03-26 DIAGNOSIS — N182 Chronic kidney disease, stage 2 (mild): Secondary | ICD-10-CM | POA: Diagnosis not present

## 2016-03-26 DIAGNOSIS — I129 Hypertensive chronic kidney disease with stage 1 through stage 4 chronic kidney disease, or unspecified chronic kidney disease: Secondary | ICD-10-CM | POA: Diagnosis not present

## 2016-03-26 DIAGNOSIS — E1140 Type 2 diabetes mellitus with diabetic neuropathy, unspecified: Secondary | ICD-10-CM | POA: Diagnosis not present

## 2016-04-11 DIAGNOSIS — Z86000 Personal history of in-situ neoplasm of breast: Secondary | ICD-10-CM | POA: Diagnosis not present

## 2016-04-11 DIAGNOSIS — Z79810 Long term (current) use of selective estrogen receptor modulators (SERMs): Secondary | ICD-10-CM | POA: Diagnosis not present

## 2016-04-11 DIAGNOSIS — D0511 Intraductal carcinoma in situ of right breast: Secondary | ICD-10-CM | POA: Diagnosis not present

## 2016-04-11 DIAGNOSIS — Z170 Estrogen receptor positive status [ER+]: Secondary | ICD-10-CM | POA: Diagnosis not present

## 2016-05-22 DIAGNOSIS — E782 Mixed hyperlipidemia: Secondary | ICD-10-CM | POA: Diagnosis not present

## 2016-05-22 DIAGNOSIS — E1165 Type 2 diabetes mellitus with hyperglycemia: Secondary | ICD-10-CM | POA: Diagnosis not present

## 2016-05-22 DIAGNOSIS — E1140 Type 2 diabetes mellitus with diabetic neuropathy, unspecified: Secondary | ICD-10-CM | POA: Diagnosis not present

## 2016-05-22 DIAGNOSIS — I129 Hypertensive chronic kidney disease with stage 1 through stage 4 chronic kidney disease, or unspecified chronic kidney disease: Secondary | ICD-10-CM | POA: Diagnosis not present

## 2016-06-12 DIAGNOSIS — E1165 Type 2 diabetes mellitus with hyperglycemia: Secondary | ICD-10-CM | POA: Diagnosis not present

## 2016-06-12 DIAGNOSIS — N182 Chronic kidney disease, stage 2 (mild): Secondary | ICD-10-CM | POA: Diagnosis not present

## 2016-06-12 DIAGNOSIS — I129 Hypertensive chronic kidney disease with stage 1 through stage 4 chronic kidney disease, or unspecified chronic kidney disease: Secondary | ICD-10-CM | POA: Diagnosis not present

## 2016-06-12 DIAGNOSIS — E1140 Type 2 diabetes mellitus with diabetic neuropathy, unspecified: Secondary | ICD-10-CM | POA: Diagnosis not present

## 2016-08-14 DIAGNOSIS — E1165 Type 2 diabetes mellitus with hyperglycemia: Secondary | ICD-10-CM | POA: Diagnosis not present

## 2016-08-14 DIAGNOSIS — I129 Hypertensive chronic kidney disease with stage 1 through stage 4 chronic kidney disease, or unspecified chronic kidney disease: Secondary | ICD-10-CM | POA: Diagnosis not present

## 2016-08-14 DIAGNOSIS — E782 Mixed hyperlipidemia: Secondary | ICD-10-CM | POA: Diagnosis not present

## 2016-08-14 DIAGNOSIS — E1140 Type 2 diabetes mellitus with diabetic neuropathy, unspecified: Secondary | ICD-10-CM | POA: Diagnosis not present

## 2016-08-26 DIAGNOSIS — I129 Hypertensive chronic kidney disease with stage 1 through stage 4 chronic kidney disease, or unspecified chronic kidney disease: Secondary | ICD-10-CM | POA: Diagnosis not present

## 2016-08-26 DIAGNOSIS — Z23 Encounter for immunization: Secondary | ICD-10-CM | POA: Diagnosis not present

## 2016-08-26 DIAGNOSIS — E1140 Type 2 diabetes mellitus with diabetic neuropathy, unspecified: Secondary | ICD-10-CM | POA: Diagnosis not present

## 2016-08-26 DIAGNOSIS — E1165 Type 2 diabetes mellitus with hyperglycemia: Secondary | ICD-10-CM | POA: Diagnosis not present

## 2016-08-26 DIAGNOSIS — Z7189 Other specified counseling: Secondary | ICD-10-CM | POA: Diagnosis not present

## 2016-08-26 DIAGNOSIS — N182 Chronic kidney disease, stage 2 (mild): Secondary | ICD-10-CM | POA: Diagnosis not present

## 2016-09-20 DIAGNOSIS — Z1231 Encounter for screening mammogram for malignant neoplasm of breast: Secondary | ICD-10-CM | POA: Diagnosis not present

## 2016-10-11 DIAGNOSIS — Z86000 Personal history of in-situ neoplasm of breast: Secondary | ICD-10-CM | POA: Diagnosis not present

## 2016-10-11 DIAGNOSIS — Z79810 Long term (current) use of selective estrogen receptor modulators (SERMs): Secondary | ICD-10-CM | POA: Diagnosis not present

## 2016-11-19 DIAGNOSIS — E1165 Type 2 diabetes mellitus with hyperglycemia: Secondary | ICD-10-CM | POA: Diagnosis not present

## 2016-11-19 DIAGNOSIS — E782 Mixed hyperlipidemia: Secondary | ICD-10-CM | POA: Diagnosis not present

## 2016-11-19 DIAGNOSIS — I129 Hypertensive chronic kidney disease with stage 1 through stage 4 chronic kidney disease, or unspecified chronic kidney disease: Secondary | ICD-10-CM | POA: Diagnosis not present

## 2016-11-19 DIAGNOSIS — E1140 Type 2 diabetes mellitus with diabetic neuropathy, unspecified: Secondary | ICD-10-CM | POA: Diagnosis not present

## 2016-12-17 DIAGNOSIS — E1140 Type 2 diabetes mellitus with diabetic neuropathy, unspecified: Secondary | ICD-10-CM | POA: Diagnosis not present

## 2016-12-17 DIAGNOSIS — E782 Mixed hyperlipidemia: Secondary | ICD-10-CM | POA: Diagnosis not present

## 2016-12-17 DIAGNOSIS — I129 Hypertensive chronic kidney disease with stage 1 through stage 4 chronic kidney disease, or unspecified chronic kidney disease: Secondary | ICD-10-CM | POA: Diagnosis not present

## 2017-01-06 DIAGNOSIS — E1140 Type 2 diabetes mellitus with diabetic neuropathy, unspecified: Secondary | ICD-10-CM | POA: Diagnosis not present

## 2017-01-06 DIAGNOSIS — R32 Unspecified urinary incontinence: Secondary | ICD-10-CM | POA: Diagnosis not present

## 2017-01-06 DIAGNOSIS — E1165 Type 2 diabetes mellitus with hyperglycemia: Secondary | ICD-10-CM | POA: Diagnosis not present

## 2017-01-06 DIAGNOSIS — Z6837 Body mass index (BMI) 37.0-37.9, adult: Secondary | ICD-10-CM | POA: Diagnosis not present

## 2017-01-06 DIAGNOSIS — N183 Chronic kidney disease, stage 3 (moderate): Secondary | ICD-10-CM | POA: Diagnosis not present

## 2017-01-06 DIAGNOSIS — I129 Hypertensive chronic kidney disease with stage 1 through stage 4 chronic kidney disease, or unspecified chronic kidney disease: Secondary | ICD-10-CM | POA: Diagnosis not present

## 2017-02-12 DIAGNOSIS — Z789 Other specified health status: Secondary | ICD-10-CM | POA: Diagnosis not present

## 2017-03-12 DIAGNOSIS — H524 Presbyopia: Secondary | ICD-10-CM | POA: Diagnosis not present

## 2017-04-10 DIAGNOSIS — Z79810 Long term (current) use of selective estrogen receptor modulators (SERMs): Secondary | ICD-10-CM | POA: Diagnosis not present

## 2017-04-10 DIAGNOSIS — D0511 Intraductal carcinoma in situ of right breast: Secondary | ICD-10-CM | POA: Diagnosis not present

## 2017-04-10 DIAGNOSIS — Z9011 Acquired absence of right breast and nipple: Secondary | ICD-10-CM | POA: Diagnosis not present

## 2017-04-10 DIAGNOSIS — D171 Benign lipomatous neoplasm of skin and subcutaneous tissue of trunk: Secondary | ICD-10-CM | POA: Diagnosis not present

## 2017-04-10 DIAGNOSIS — Z86000 Personal history of in-situ neoplasm of breast: Secondary | ICD-10-CM | POA: Diagnosis not present

## 2017-04-10 DIAGNOSIS — Z79811 Long term (current) use of aromatase inhibitors: Secondary | ICD-10-CM | POA: Diagnosis not present

## 2017-04-10 DIAGNOSIS — Z1509 Genetic susceptibility to other malignant neoplasm: Secondary | ICD-10-CM | POA: Diagnosis not present

## 2017-04-10 DIAGNOSIS — Z1589 Genetic susceptibility to other disease: Secondary | ICD-10-CM | POA: Diagnosis not present

## 2017-05-08 DIAGNOSIS — E782 Mixed hyperlipidemia: Secondary | ICD-10-CM | POA: Diagnosis not present

## 2017-05-08 DIAGNOSIS — I129 Hypertensive chronic kidney disease with stage 1 through stage 4 chronic kidney disease, or unspecified chronic kidney disease: Secondary | ICD-10-CM | POA: Diagnosis not present

## 2017-05-08 DIAGNOSIS — E1140 Type 2 diabetes mellitus with diabetic neuropathy, unspecified: Secondary | ICD-10-CM | POA: Diagnosis not present

## 2017-05-14 DIAGNOSIS — E782 Mixed hyperlipidemia: Secondary | ICD-10-CM | POA: Diagnosis not present

## 2017-05-14 DIAGNOSIS — E1165 Type 2 diabetes mellitus with hyperglycemia: Secondary | ICD-10-CM | POA: Diagnosis not present

## 2017-05-14 DIAGNOSIS — Z139 Encounter for screening, unspecified: Secondary | ICD-10-CM | POA: Diagnosis not present

## 2017-05-14 DIAGNOSIS — I739 Peripheral vascular disease, unspecified: Secondary | ICD-10-CM | POA: Diagnosis not present

## 2017-05-14 DIAGNOSIS — E1140 Type 2 diabetes mellitus with diabetic neuropathy, unspecified: Secondary | ICD-10-CM | POA: Diagnosis not present

## 2017-05-14 DIAGNOSIS — I129 Hypertensive chronic kidney disease with stage 1 through stage 4 chronic kidney disease, or unspecified chronic kidney disease: Secondary | ICD-10-CM | POA: Diagnosis not present

## 2017-05-14 DIAGNOSIS — Z0000 Encounter for general adult medical examination without abnormal findings: Secondary | ICD-10-CM | POA: Diagnosis not present

## 2017-05-14 DIAGNOSIS — Z6836 Body mass index (BMI) 36.0-36.9, adult: Secondary | ICD-10-CM | POA: Diagnosis not present

## 2017-05-14 DIAGNOSIS — Z1389 Encounter for screening for other disorder: Secondary | ICD-10-CM | POA: Diagnosis not present

## 2017-05-23 DIAGNOSIS — Z6837 Body mass index (BMI) 37.0-37.9, adult: Secondary | ICD-10-CM | POA: Diagnosis not present

## 2017-05-23 DIAGNOSIS — R200 Anesthesia of skin: Secondary | ICD-10-CM | POA: Diagnosis not present

## 2017-05-23 DIAGNOSIS — Z139 Encounter for screening, unspecified: Secondary | ICD-10-CM | POA: Diagnosis not present

## 2017-06-04 DIAGNOSIS — Z9886 Personal history of breast implant removal: Secondary | ICD-10-CM | POA: Diagnosis not present

## 2017-06-04 DIAGNOSIS — K648 Other hemorrhoids: Secondary | ICD-10-CM | POA: Diagnosis not present

## 2017-06-04 DIAGNOSIS — R1031 Right lower quadrant pain: Secondary | ICD-10-CM | POA: Diagnosis not present

## 2017-07-11 DIAGNOSIS — M8580 Other specified disorders of bone density and structure, unspecified site: Secondary | ICD-10-CM

## 2017-07-11 DIAGNOSIS — Z23 Encounter for immunization: Secondary | ICD-10-CM | POA: Diagnosis not present

## 2017-07-11 DIAGNOSIS — Z780 Asymptomatic menopausal state: Secondary | ICD-10-CM | POA: Insufficient documentation

## 2017-07-11 HISTORY — DX: Other specified disorders of bone density and structure, unspecified site: M85.80

## 2017-07-11 HISTORY — DX: Asymptomatic menopausal state: Z78.0

## 2017-07-18 DIAGNOSIS — Z780 Asymptomatic menopausal state: Secondary | ICD-10-CM | POA: Diagnosis not present

## 2017-07-18 DIAGNOSIS — M85862 Other specified disorders of bone density and structure, left lower leg: Secondary | ICD-10-CM | POA: Diagnosis not present

## 2017-07-18 DIAGNOSIS — M85852 Other specified disorders of bone density and structure, left thigh: Secondary | ICD-10-CM | POA: Diagnosis not present

## 2017-08-20 DIAGNOSIS — Z79899 Other long term (current) drug therapy: Secondary | ICD-10-CM | POA: Diagnosis not present

## 2017-08-20 DIAGNOSIS — I10 Essential (primary) hypertension: Secondary | ICD-10-CM | POA: Diagnosis not present

## 2017-08-20 DIAGNOSIS — B029 Zoster without complications: Secondary | ICD-10-CM | POA: Diagnosis not present

## 2017-09-12 DIAGNOSIS — E782 Mixed hyperlipidemia: Secondary | ICD-10-CM | POA: Diagnosis not present

## 2017-09-12 DIAGNOSIS — E1140 Type 2 diabetes mellitus with diabetic neuropathy, unspecified: Secondary | ICD-10-CM | POA: Diagnosis not present

## 2017-09-12 DIAGNOSIS — I129 Hypertensive chronic kidney disease with stage 1 through stage 4 chronic kidney disease, or unspecified chronic kidney disease: Secondary | ICD-10-CM | POA: Diagnosis not present

## 2017-09-19 DIAGNOSIS — Z6837 Body mass index (BMI) 37.0-37.9, adult: Secondary | ICD-10-CM | POA: Diagnosis not present

## 2017-09-19 DIAGNOSIS — L509 Urticaria, unspecified: Secondary | ICD-10-CM | POA: Diagnosis not present

## 2017-09-19 DIAGNOSIS — E1165 Type 2 diabetes mellitus with hyperglycemia: Secondary | ICD-10-CM | POA: Diagnosis not present

## 2017-09-19 DIAGNOSIS — B029 Zoster without complications: Secondary | ICD-10-CM | POA: Diagnosis not present

## 2017-09-19 DIAGNOSIS — E1140 Type 2 diabetes mellitus with diabetic neuropathy, unspecified: Secondary | ICD-10-CM | POA: Diagnosis not present

## 2017-09-19 DIAGNOSIS — J301 Allergic rhinitis due to pollen: Secondary | ICD-10-CM | POA: Diagnosis not present

## 2017-09-19 DIAGNOSIS — J3089 Other allergic rhinitis: Secondary | ICD-10-CM | POA: Diagnosis not present

## 2017-09-19 DIAGNOSIS — J3081 Allergic rhinitis due to animal (cat) (dog) hair and dander: Secondary | ICD-10-CM | POA: Diagnosis not present

## 2017-09-22 DIAGNOSIS — J3081 Allergic rhinitis due to animal (cat) (dog) hair and dander: Secondary | ICD-10-CM | POA: Diagnosis not present

## 2017-09-22 DIAGNOSIS — J3089 Other allergic rhinitis: Secondary | ICD-10-CM | POA: Diagnosis not present

## 2017-09-22 DIAGNOSIS — J301 Allergic rhinitis due to pollen: Secondary | ICD-10-CM | POA: Diagnosis not present

## 2017-09-24 DIAGNOSIS — J3081 Allergic rhinitis due to animal (cat) (dog) hair and dander: Secondary | ICD-10-CM | POA: Diagnosis not present

## 2017-09-24 DIAGNOSIS — J301 Allergic rhinitis due to pollen: Secondary | ICD-10-CM | POA: Diagnosis not present

## 2017-09-24 DIAGNOSIS — J3089 Other allergic rhinitis: Secondary | ICD-10-CM | POA: Diagnosis not present

## 2017-09-25 DIAGNOSIS — J3089 Other allergic rhinitis: Secondary | ICD-10-CM | POA: Diagnosis not present

## 2017-09-25 DIAGNOSIS — J301 Allergic rhinitis due to pollen: Secondary | ICD-10-CM | POA: Diagnosis not present

## 2017-09-25 DIAGNOSIS — J3081 Allergic rhinitis due to animal (cat) (dog) hair and dander: Secondary | ICD-10-CM | POA: Diagnosis not present

## 2017-09-26 DIAGNOSIS — J3081 Allergic rhinitis due to animal (cat) (dog) hair and dander: Secondary | ICD-10-CM | POA: Diagnosis not present

## 2017-09-26 DIAGNOSIS — J301 Allergic rhinitis due to pollen: Secondary | ICD-10-CM | POA: Diagnosis not present

## 2017-09-26 DIAGNOSIS — J3089 Other allergic rhinitis: Secondary | ICD-10-CM | POA: Diagnosis not present

## 2017-09-29 DIAGNOSIS — J3081 Allergic rhinitis due to animal (cat) (dog) hair and dander: Secondary | ICD-10-CM | POA: Diagnosis not present

## 2017-09-29 DIAGNOSIS — J3089 Other allergic rhinitis: Secondary | ICD-10-CM | POA: Diagnosis not present

## 2017-09-29 DIAGNOSIS — J301 Allergic rhinitis due to pollen: Secondary | ICD-10-CM | POA: Diagnosis not present

## 2017-10-01 DIAGNOSIS — J301 Allergic rhinitis due to pollen: Secondary | ICD-10-CM | POA: Diagnosis not present

## 2017-10-01 DIAGNOSIS — J3081 Allergic rhinitis due to animal (cat) (dog) hair and dander: Secondary | ICD-10-CM | POA: Diagnosis not present

## 2017-10-01 DIAGNOSIS — J3089 Other allergic rhinitis: Secondary | ICD-10-CM | POA: Diagnosis not present

## 2017-10-02 DIAGNOSIS — J301 Allergic rhinitis due to pollen: Secondary | ICD-10-CM | POA: Diagnosis not present

## 2017-10-02 DIAGNOSIS — J3089 Other allergic rhinitis: Secondary | ICD-10-CM | POA: Diagnosis not present

## 2017-10-02 DIAGNOSIS — J3081 Allergic rhinitis due to animal (cat) (dog) hair and dander: Secondary | ICD-10-CM | POA: Diagnosis not present

## 2017-10-02 DIAGNOSIS — Z1231 Encounter for screening mammogram for malignant neoplasm of breast: Secondary | ICD-10-CM | POA: Diagnosis not present

## 2017-10-08 DIAGNOSIS — Z86000 Personal history of in-situ neoplasm of breast: Secondary | ICD-10-CM | POA: Diagnosis not present

## 2017-10-08 DIAGNOSIS — R079 Chest pain, unspecified: Secondary | ICD-10-CM | POA: Diagnosis not present

## 2017-10-08 DIAGNOSIS — Z79810 Long term (current) use of selective estrogen receptor modulators (SERMs): Secondary | ICD-10-CM | POA: Diagnosis not present

## 2017-10-13 DIAGNOSIS — E782 Mixed hyperlipidemia: Secondary | ICD-10-CM | POA: Diagnosis not present

## 2017-10-13 DIAGNOSIS — I129 Hypertensive chronic kidney disease with stage 1 through stage 4 chronic kidney disease, or unspecified chronic kidney disease: Secondary | ICD-10-CM | POA: Diagnosis not present

## 2017-10-13 DIAGNOSIS — E1140 Type 2 diabetes mellitus with diabetic neuropathy, unspecified: Secondary | ICD-10-CM | POA: Diagnosis not present

## 2017-10-22 DIAGNOSIS — N6489 Other specified disorders of breast: Secondary | ICD-10-CM | POA: Diagnosis not present

## 2017-10-22 DIAGNOSIS — R928 Other abnormal and inconclusive findings on diagnostic imaging of breast: Secondary | ICD-10-CM | POA: Diagnosis not present

## 2017-10-22 DIAGNOSIS — N649 Disorder of breast, unspecified: Secondary | ICD-10-CM | POA: Diagnosis not present

## 2017-10-31 DIAGNOSIS — E782 Mixed hyperlipidemia: Secondary | ICD-10-CM | POA: Diagnosis not present

## 2017-10-31 DIAGNOSIS — E1140 Type 2 diabetes mellitus with diabetic neuropathy, unspecified: Secondary | ICD-10-CM | POA: Diagnosis not present

## 2017-10-31 DIAGNOSIS — I129 Hypertensive chronic kidney disease with stage 1 through stage 4 chronic kidney disease, or unspecified chronic kidney disease: Secondary | ICD-10-CM | POA: Diagnosis not present

## 2017-10-31 DIAGNOSIS — E1165 Type 2 diabetes mellitus with hyperglycemia: Secondary | ICD-10-CM | POA: Diagnosis not present

## 2017-11-07 DIAGNOSIS — E1165 Type 2 diabetes mellitus with hyperglycemia: Secondary | ICD-10-CM | POA: Diagnosis not present

## 2017-11-07 DIAGNOSIS — E1140 Type 2 diabetes mellitus with diabetic neuropathy, unspecified: Secondary | ICD-10-CM | POA: Diagnosis not present

## 2017-11-07 DIAGNOSIS — Z789 Other specified health status: Secondary | ICD-10-CM | POA: Diagnosis not present

## 2017-12-24 DIAGNOSIS — Z6837 Body mass index (BMI) 37.0-37.9, adult: Secondary | ICD-10-CM | POA: Diagnosis not present

## 2017-12-24 DIAGNOSIS — E1165 Type 2 diabetes mellitus with hyperglycemia: Secondary | ICD-10-CM | POA: Diagnosis not present

## 2017-12-24 DIAGNOSIS — E1140 Type 2 diabetes mellitus with diabetic neuropathy, unspecified: Secondary | ICD-10-CM | POA: Diagnosis not present

## 2018-01-07 DIAGNOSIS — E1140 Type 2 diabetes mellitus with diabetic neuropathy, unspecified: Secondary | ICD-10-CM | POA: Diagnosis not present

## 2018-01-07 DIAGNOSIS — E1165 Type 2 diabetes mellitus with hyperglycemia: Secondary | ICD-10-CM | POA: Diagnosis not present

## 2018-01-07 DIAGNOSIS — Z6837 Body mass index (BMI) 37.0-37.9, adult: Secondary | ICD-10-CM | POA: Diagnosis not present

## 2018-01-21 DIAGNOSIS — E1140 Type 2 diabetes mellitus with diabetic neuropathy, unspecified: Secondary | ICD-10-CM | POA: Diagnosis not present

## 2018-01-21 DIAGNOSIS — E1165 Type 2 diabetes mellitus with hyperglycemia: Secondary | ICD-10-CM | POA: Diagnosis not present

## 2018-01-21 DIAGNOSIS — Z6837 Body mass index (BMI) 37.0-37.9, adult: Secondary | ICD-10-CM | POA: Diagnosis not present

## 2018-01-27 DIAGNOSIS — E782 Mixed hyperlipidemia: Secondary | ICD-10-CM | POA: Diagnosis not present

## 2018-01-27 DIAGNOSIS — I129 Hypertensive chronic kidney disease with stage 1 through stage 4 chronic kidney disease, or unspecified chronic kidney disease: Secondary | ICD-10-CM | POA: Diagnosis not present

## 2018-01-27 DIAGNOSIS — E1140 Type 2 diabetes mellitus with diabetic neuropathy, unspecified: Secondary | ICD-10-CM | POA: Diagnosis not present

## 2018-01-27 DIAGNOSIS — I739 Peripheral vascular disease, unspecified: Secondary | ICD-10-CM | POA: Diagnosis not present

## 2018-02-05 DIAGNOSIS — E782 Mixed hyperlipidemia: Secondary | ICD-10-CM | POA: Diagnosis not present

## 2018-02-05 DIAGNOSIS — Z91018 Allergy to other foods: Secondary | ICD-10-CM | POA: Diagnosis not present

## 2018-02-05 DIAGNOSIS — E1140 Type 2 diabetes mellitus with diabetic neuropathy, unspecified: Secondary | ICD-10-CM | POA: Diagnosis not present

## 2018-02-05 DIAGNOSIS — E1165 Type 2 diabetes mellitus with hyperglycemia: Secondary | ICD-10-CM | POA: Diagnosis not present

## 2018-02-27 DIAGNOSIS — N183 Chronic kidney disease, stage 3 (moderate): Secondary | ICD-10-CM | POA: Diagnosis not present

## 2018-02-27 DIAGNOSIS — E1169 Type 2 diabetes mellitus with other specified complication: Secondary | ICD-10-CM | POA: Diagnosis not present

## 2018-03-12 ENCOUNTER — Ambulatory Visit: Payer: Medicare HMO | Admitting: Sports Medicine

## 2018-03-12 ENCOUNTER — Encounter: Payer: Self-pay | Admitting: Sports Medicine

## 2018-03-12 VITALS — BP 123/73 | HR 78 | Temp 98.4°F | Resp 16 | Ht 64.0 in | Wt 205.0 lb

## 2018-03-12 DIAGNOSIS — L84 Corns and callosities: Secondary | ICD-10-CM

## 2018-03-12 DIAGNOSIS — B351 Tinea unguium: Secondary | ICD-10-CM

## 2018-03-12 DIAGNOSIS — E1142 Type 2 diabetes mellitus with diabetic polyneuropathy: Secondary | ICD-10-CM | POA: Diagnosis not present

## 2018-03-12 DIAGNOSIS — M79675 Pain in left toe(s): Secondary | ICD-10-CM

## 2018-03-12 DIAGNOSIS — M79674 Pain in right toe(s): Secondary | ICD-10-CM

## 2018-03-12 NOTE — Patient Instructions (Signed)

## 2018-03-12 NOTE — Progress Notes (Signed)
   Subjective:    Patient ID: [name] [name], female    DOB: [birth year], 77 y.o.   MRN: [id number]  HPI    Review of Systems  All other systems reviewed and are negative.      Objective:   Physical Exam        Assessment & Plan:

## 2018-03-12 NOTE — Progress Notes (Signed)
Subjective: [name] [name] is a 77 y.o. female patient with history of diabetes who presents to office today complaining of long,mildly painful nails  while ambulating in shoes; unable to trim. Patient states that the glucose reading this morning was not recorded however last A1c was 6.4, 37-monthago when she saw Dr. HNyra Capesstates that she did have a hard callus on the bottom of the right foot that seems to peel off and reports that she is interested in routine diabetic foot care and diabetic shoes at this time.  Patient denies any other pedal complaints.  Review of Systems  All other systems reviewed and are negative.   There are no active problems to display for this patient.  Current Outpatient Medications on File Prior to Visit  Medication Sig Dispense Refill  . Cinnamon 500 MG capsule Take 500 mg by mouth daily.    . citalopram (CELEXA) 40 MG tablet Take 40 mg by mouth daily.    . clindamycin (CLEOCIN) 150 MG capsule Take 1 capsule (150 mg total) by mouth 4 (four) times daily. 30 capsule 0  . cyclobenzaprine (FLEXERIL) 10 MG tablet Take 10 mg by mouth 3 (three) times daily as needed for muscle spasms.    . ferrous sulfate 325 (65 FE) MG tablet Take 65 mg by mouth daily with breakfast.    . furosemide (LASIX) 20 MG tablet Take 20 mg by mouth.    . Garlic 13614MG CAPS Take by mouth.    .Marland Kitchenglucosamine-chondroitin 500-400 MG tablet Take 1 tablet by mouth 3 (three) times daily. 15022mlc    . lisinopril-hydrochlorothiazide (PRINZIDE,ZESTORETIC) 20-12.5 MG per tablet Take 1 tablet by mouth. (once or 2 times daily??)     . loratadine (CLARITIN) 10 MG tablet Take 10 mg by mouth daily.    . meclizine (ANTIVERT) 25 MG tablet Take 25 mg by mouth 3 (three) times daily as needed for dizziness.    . naproxen (NAPROSYN) 500 MG tablet Take 500 mg by mouth 2 (two) times daily with a meal.    . Omega-3 Fatty Acids (FISH OIL) 1000 MG CAPS Take by mouth.    . pravastatin (PRAVACHOL) 40 MG tablet Take 40  mg by mouth daily.    . Probiotic Product (ALIGN) 4 MG CAPS Take by mouth.    . Turmeric 500 MG CAPS Take by mouth.    . vitamin B-12 (CYANOCOBALAMIN) 250 MCG tablet Take 250 mcg by mouth daily.    . vitamin E 100 UNIT capsule Take by mouth daily.     No current facility-administered medications on file prior to visit.    Allergies  Allergen Reactions  . Penicillins     No results found for this or any previous visit (from the past 2160 hour(s)).  Objective: General: Patient is awake, alert, and oriented x 3 and in no acute distress.  Integument: Skin is warm, dry and supple bilateral. Nails are tender, long, thickened and  dystrophic with subungual debris, consistent with onychomycosis, 1-5 bilateral. No signs of infection.  Minimal callus right sub-met 1 and left medial first toe.  No open lesions or preulcerative lesions present bilateral. Remaining integument unremarkable.  Vasculature:  Dorsalis Pedis pulse 2/4 bilateral. Posterior Tibial pulse  1/4 bilateral.  Capillary fill time <3 sec 1-5 bilateral. Positive hair growth to the level of the digits. Temperature gradient within normal limits.  Mild varicosities present bilateral. No edema present bilateral.   Neurology: The patient has diminished sensation measured with a 5.07/10g  Semmes Weinstein Monofilament at all pedal sites bilateral . Vibratory sensation diminished bilateral with tuning fork. No Babinski sign present bilateral.   Musculoskeletal: Asymptomatic hammertoe and pes planus pedal deformities noted bilateral. Muscular strength 5/5 in all lower extremity muscular groups bilateral without pain on range of motion . No tenderness with calf compression bilateral.  Assessment and Plan: Problem List Items Addressed This Visit    None    Visit Diagnoses    Pain due to onychomycosis of toenails of both feet    -  Primary   Diabetic polyneuropathy associated with type 2 diabetes mellitus (HCC)       Foot callus          -Examined patient. -Discussed and educated patient on diabetic foot care, especially with  regards to the vascular, neurological and musculoskeletal systems.  -Stressed the importance of good glycemic control and the detriment of not  controlling glucose levels in relation to the foot. -Mechanically debrided all nails 1-5 bilateral using sterile nail nipper and filed with dremel without incident  -Mechanically smooth callus using rotary bur without incident advised patient good skin cream and pumice stone as needed -[employer] diabetic shoe order form was completed; office to contact primary care for approval / certification;  Office to arrange shoe fitting and dispensing. -Answered all patient questions -Patient to return  in 3 months for at risk foot care as scheduled and return for diabetic shoe measurements as scheduled -Patient advised to call the office if any problems or questions arise in the meantime.  Landis Martins, DPM

## 2018-03-29 DIAGNOSIS — E1140 Type 2 diabetes mellitus with diabetic neuropathy, unspecified: Secondary | ICD-10-CM | POA: Diagnosis not present

## 2018-03-29 DIAGNOSIS — E1165 Type 2 diabetes mellitus with hyperglycemia: Secondary | ICD-10-CM | POA: Diagnosis not present

## 2018-03-29 DIAGNOSIS — N183 Chronic kidney disease, stage 3 (moderate): Secondary | ICD-10-CM | POA: Diagnosis not present

## 2018-03-29 DIAGNOSIS — E1169 Type 2 diabetes mellitus with other specified complication: Secondary | ICD-10-CM | POA: Diagnosis not present

## 2018-04-08 DIAGNOSIS — D0511 Intraductal carcinoma in situ of right breast: Secondary | ICD-10-CM | POA: Diagnosis not present

## 2018-04-08 DIAGNOSIS — Z9011 Acquired absence of right breast and nipple: Secondary | ICD-10-CM | POA: Diagnosis not present

## 2018-04-08 DIAGNOSIS — Z79810 Long term (current) use of selective estrogen receptor modulators (SERMs): Secondary | ICD-10-CM | POA: Diagnosis not present

## 2018-04-08 DIAGNOSIS — Z1589 Genetic susceptibility to other disease: Secondary | ICD-10-CM | POA: Diagnosis not present

## 2018-04-08 DIAGNOSIS — R946 Abnormal results of thyroid function studies: Secondary | ICD-10-CM | POA: Diagnosis not present

## 2018-04-08 DIAGNOSIS — Z853 Personal history of malignant neoplasm of breast: Secondary | ICD-10-CM | POA: Diagnosis not present

## 2018-04-09 ENCOUNTER — Ambulatory Visit: Payer: Medicare HMO | Admitting: *Deleted

## 2018-04-09 DIAGNOSIS — E1142 Type 2 diabetes mellitus with diabetic polyneuropathy: Secondary | ICD-10-CM

## 2018-04-20 DIAGNOSIS — E1142 Type 2 diabetes mellitus with diabetic polyneuropathy: Secondary | ICD-10-CM | POA: Diagnosis not present

## 2018-04-20 DIAGNOSIS — Z9181 History of falling: Secondary | ICD-10-CM | POA: Diagnosis not present

## 2018-04-20 DIAGNOSIS — Z0000 Encounter for general adult medical examination without abnormal findings: Secondary | ICD-10-CM | POA: Diagnosis not present

## 2018-04-20 DIAGNOSIS — Z139 Encounter for screening, unspecified: Secondary | ICD-10-CM | POA: Diagnosis not present

## 2018-04-20 DIAGNOSIS — E039 Hypothyroidism, unspecified: Secondary | ICD-10-CM | POA: Diagnosis not present

## 2018-04-20 DIAGNOSIS — Z1331 Encounter for screening for depression: Secondary | ICD-10-CM | POA: Diagnosis not present

## 2018-05-29 DIAGNOSIS — E1142 Type 2 diabetes mellitus with diabetic polyneuropathy: Secondary | ICD-10-CM | POA: Diagnosis not present

## 2018-05-29 DIAGNOSIS — Z6838 Body mass index (BMI) 38.0-38.9, adult: Secondary | ICD-10-CM | POA: Diagnosis not present

## 2018-05-29 DIAGNOSIS — N183 Chronic kidney disease, stage 3 (moderate): Secondary | ICD-10-CM | POA: Diagnosis not present

## 2018-06-02 DIAGNOSIS — E782 Mixed hyperlipidemia: Secondary | ICD-10-CM | POA: Diagnosis not present

## 2018-06-02 DIAGNOSIS — E039 Hypothyroidism, unspecified: Secondary | ICD-10-CM | POA: Diagnosis not present

## 2018-06-02 DIAGNOSIS — E1142 Type 2 diabetes mellitus with diabetic polyneuropathy: Secondary | ICD-10-CM | POA: Diagnosis not present

## 2018-06-02 DIAGNOSIS — I129 Hypertensive chronic kidney disease with stage 1 through stage 4 chronic kidney disease, or unspecified chronic kidney disease: Secondary | ICD-10-CM | POA: Diagnosis not present

## 2018-06-11 DIAGNOSIS — E782 Mixed hyperlipidemia: Secondary | ICD-10-CM | POA: Diagnosis not present

## 2018-06-11 DIAGNOSIS — I129 Hypertensive chronic kidney disease with stage 1 through stage 4 chronic kidney disease, or unspecified chronic kidney disease: Secondary | ICD-10-CM | POA: Diagnosis not present

## 2018-06-11 DIAGNOSIS — N183 Chronic kidney disease, stage 3 (moderate): Secondary | ICD-10-CM | POA: Diagnosis not present

## 2018-06-11 DIAGNOSIS — Z23 Encounter for immunization: Secondary | ICD-10-CM | POA: Diagnosis not present

## 2018-06-11 DIAGNOSIS — E1142 Type 2 diabetes mellitus with diabetic polyneuropathy: Secondary | ICD-10-CM | POA: Diagnosis not present

## 2018-06-12 ENCOUNTER — Ambulatory Visit: Payer: Medicare HMO | Admitting: Sports Medicine

## 2018-06-12 ENCOUNTER — Encounter: Payer: Self-pay | Admitting: Sports Medicine

## 2018-06-12 VITALS — BP 97/59 | HR 84

## 2018-06-12 DIAGNOSIS — M2141 Flat foot [pes planus] (acquired), right foot: Secondary | ICD-10-CM | POA: Diagnosis not present

## 2018-06-12 DIAGNOSIS — M79675 Pain in left toe(s): Secondary | ICD-10-CM | POA: Diagnosis not present

## 2018-06-12 DIAGNOSIS — E1142 Type 2 diabetes mellitus with diabetic polyneuropathy: Secondary | ICD-10-CM | POA: Diagnosis not present

## 2018-06-12 DIAGNOSIS — M2042 Other hammer toe(s) (acquired), left foot: Secondary | ICD-10-CM

## 2018-06-12 DIAGNOSIS — L84 Corns and callosities: Secondary | ICD-10-CM

## 2018-06-12 DIAGNOSIS — M2142 Flat foot [pes planus] (acquired), left foot: Secondary | ICD-10-CM | POA: Diagnosis not present

## 2018-06-12 DIAGNOSIS — M79674 Pain in right toe(s): Secondary | ICD-10-CM

## 2018-06-12 DIAGNOSIS — M2041 Other hammer toe(s) (acquired), right foot: Secondary | ICD-10-CM

## 2018-06-12 DIAGNOSIS — B351 Tinea unguium: Secondary | ICD-10-CM | POA: Diagnosis not present

## 2018-06-12 NOTE — Progress Notes (Addendum)
Subjective: [name] [name] is a 77 y.o. female patient with history of diabetes who presents to office today complaining of long,mildly painful nails  while ambulating in shoes; unable to trim. Patient states that the glucose reading this morning was not recorded however last A1c was 6.5, 1-day ago when she saw Dr. Nyra Capes.  Patient requests that her nails be trimmed short.  Patient is also here for pickup of diabetic shoes.  Patient denies any other pedal complaints.  There are no active problems to display for this patient.  Current Outpatient Medications on File Prior to Visit  Medication Sig Dispense Refill  . Cinnamon 500 MG capsule Take 500 mg by mouth daily.    . Garlic 4270 MG CAPS Take by mouth.    . hydrOXYzine (ATARAX/VISTARIL) 25 MG tablet Take 25 mg by mouth 3 (three) times daily as needed.    Marland Kitchen lisinopril-hydrochlorothiazide (PRINZIDE,ZESTORETIC) 20-12.5 MG per tablet Take 1 tablet by mouth. (once or 2 times daily??)     . Melatonin 10 MG CAPS Take by mouth.    . pravastatin (PRAVACHOL) 40 MG tablet Take 40 mg by mouth daily.    . tamoxifen (NOLVADEX) 20 MG tablet Take 20 mg by mouth daily.     No current facility-administered medications on file prior to visit.    Allergies  Allergen Reactions  . Penicillins     No results found for this or any previous visit (from the past 2160 hour(s)).  Objective: General: Patient is awake, alert, and oriented x 3 and in no acute distress.  Integument: Skin is warm, dry and supple bilateral. Nails are tender, long, thickened and  dystrophic with subungual debris, consistent with onychomycosis, 1-5 bilateral. No signs of infection.  Minimal callus right sub-met 1 and left medial first toe.  No open lesions or preulcerative lesions present bilateral. Remaining integument unremarkable.  Vasculature:  Dorsalis Pedis pulse 2/4 bilateral. Posterior Tibial pulse  1/4 bilateral.  Capillary fill time <3 sec 1-5 bilateral. Positive hair  growth to the level of the digits. Temperature gradient within normal limits.  Mild varicosities and venous pigmentation present bilateral.  Trace edema present bilateral.   Neurology: The patient has diminished sensation measured with a 5.07/10g Semmes Weinstein Monofilament at all pedal sites bilateral . Vibratory sensation diminished bilateral with tuning fork. No Babinski sign present bilateral.   Musculoskeletal: Asymptomatic hammertoe and pes planus pedal deformities noted bilateral. Muscular strength 5/5 in all lower extremity muscular groups bilateral without pain on range of motion . No tenderness with calf compression bilateral.  Diabetic shoes were dispensed to patient with appropriate fit and providing comfort and support patient able to ambulate at least 10 feet without pain or discomfort.  Assessment and Plan: Problem List Items Addressed This Visit    None    Visit Diagnoses    Pain due to onychomycosis of toenails of both feet    -  Primary   Diabetic polyneuropathy associated with type 2 diabetes mellitus ([place])       Foot callus         -Examined patient. -Discussed and educated patient on diabetic foot care, especially with  regards to the vascular, neurological and musculoskeletal systems.  -Mechanically debrided all nails 1-5 bilateral using sterile nail nipper and filed with dremel without incident  -Mechanically smooth callus using rotary bur without incident advised patient good skin cream and pumice stone as needed -Recommend compression garments from elastic therapy to assist with edema that is trace and  early venous pigmentation bilateral -Diabetic shoes dispensed with wear and break in period explained; patient satisfied with fit and comfortable shoes. -Answered all patient questions -Patient to return  in 2.5 to 3 months for at risk foot care as scheduled and return for diabetic shoe measurements as scheduled -Patient advised to call the office if any problems  or questions arise in the meantime.  Landis Martins, DPM

## 2018-06-29 DIAGNOSIS — E782 Mixed hyperlipidemia: Secondary | ICD-10-CM | POA: Diagnosis not present

## 2018-06-29 DIAGNOSIS — E1142 Type 2 diabetes mellitus with diabetic polyneuropathy: Secondary | ICD-10-CM | POA: Diagnosis not present

## 2018-06-29 DIAGNOSIS — I129 Hypertensive chronic kidney disease with stage 1 through stage 4 chronic kidney disease, or unspecified chronic kidney disease: Secondary | ICD-10-CM | POA: Diagnosis not present

## 2018-06-29 DIAGNOSIS — N183 Chronic kidney disease, stage 3 (moderate): Secondary | ICD-10-CM | POA: Diagnosis not present

## 2018-07-23 DIAGNOSIS — E039 Hypothyroidism, unspecified: Secondary | ICD-10-CM | POA: Diagnosis not present

## 2018-07-23 DIAGNOSIS — Z6839 Body mass index (BMI) 39.0-39.9, adult: Secondary | ICD-10-CM | POA: Diagnosis not present

## 2018-07-23 DIAGNOSIS — R5383 Other fatigue: Secondary | ICD-10-CM | POA: Diagnosis not present

## 2018-07-30 DIAGNOSIS — E782 Mixed hyperlipidemia: Secondary | ICD-10-CM | POA: Diagnosis not present

## 2018-07-30 DIAGNOSIS — N183 Chronic kidney disease, stage 3 (moderate): Secondary | ICD-10-CM | POA: Diagnosis not present

## 2018-07-30 DIAGNOSIS — I129 Hypertensive chronic kidney disease with stage 1 through stage 4 chronic kidney disease, or unspecified chronic kidney disease: Secondary | ICD-10-CM | POA: Diagnosis not present

## 2018-07-30 DIAGNOSIS — E1142 Type 2 diabetes mellitus with diabetic polyneuropathy: Secondary | ICD-10-CM | POA: Diagnosis not present

## 2018-08-14 NOTE — Progress Notes (Signed)
Patient ID: [name] [name], female   DOB: Oct 02, 1940, 77 y.o.   MRN: [id number]   Patient presents at Dr Leeanne Rio request to be measured for diabetic shoes and inserts with [facility] Certified Pedorthist.  Patient will be called when shoes and inserts arrive to schedule a fitting.

## 2018-08-29 DIAGNOSIS — E782 Mixed hyperlipidemia: Secondary | ICD-10-CM | POA: Diagnosis not present

## 2018-08-29 DIAGNOSIS — E1142 Type 2 diabetes mellitus with diabetic polyneuropathy: Secondary | ICD-10-CM | POA: Diagnosis not present

## 2018-08-29 DIAGNOSIS — N183 Chronic kidney disease, stage 3 (moderate): Secondary | ICD-10-CM | POA: Diagnosis not present

## 2018-08-29 DIAGNOSIS — I129 Hypertensive chronic kidney disease with stage 1 through stage 4 chronic kidney disease, or unspecified chronic kidney disease: Secondary | ICD-10-CM | POA: Diagnosis not present

## 2018-09-03 DIAGNOSIS — E1142 Type 2 diabetes mellitus with diabetic polyneuropathy: Secondary | ICD-10-CM | POA: Diagnosis not present

## 2018-09-03 DIAGNOSIS — E782 Mixed hyperlipidemia: Secondary | ICD-10-CM | POA: Diagnosis not present

## 2018-09-03 DIAGNOSIS — I129 Hypertensive chronic kidney disease with stage 1 through stage 4 chronic kidney disease, or unspecified chronic kidney disease: Secondary | ICD-10-CM | POA: Diagnosis not present

## 2018-09-10 DIAGNOSIS — Z139 Encounter for screening, unspecified: Secondary | ICD-10-CM | POA: Diagnosis not present

## 2018-09-10 DIAGNOSIS — E782 Mixed hyperlipidemia: Secondary | ICD-10-CM | POA: Diagnosis not present

## 2018-09-10 DIAGNOSIS — E1142 Type 2 diabetes mellitus with diabetic polyneuropathy: Secondary | ICD-10-CM | POA: Diagnosis not present

## 2018-09-10 DIAGNOSIS — N183 Chronic kidney disease, stage 3 (moderate): Secondary | ICD-10-CM | POA: Diagnosis not present

## 2018-09-10 DIAGNOSIS — I129 Hypertensive chronic kidney disease with stage 1 through stage 4 chronic kidney disease, or unspecified chronic kidney disease: Secondary | ICD-10-CM | POA: Diagnosis not present

## 2018-09-11 ENCOUNTER — Ambulatory Visit: Payer: Medicare HMO | Admitting: Sports Medicine

## 2018-09-16 DIAGNOSIS — K648 Other hemorrhoids: Secondary | ICD-10-CM | POA: Diagnosis not present

## 2018-09-16 DIAGNOSIS — K5900 Constipation, unspecified: Secondary | ICD-10-CM | POA: Diagnosis not present

## 2018-09-29 DIAGNOSIS — N183 Chronic kidney disease, stage 3 (moderate): Secondary | ICD-10-CM | POA: Diagnosis not present

## 2018-09-29 DIAGNOSIS — I129 Hypertensive chronic kidney disease with stage 1 through stage 4 chronic kidney disease, or unspecified chronic kidney disease: Secondary | ICD-10-CM | POA: Diagnosis not present

## 2018-09-29 DIAGNOSIS — E782 Mixed hyperlipidemia: Secondary | ICD-10-CM | POA: Diagnosis not present

## 2018-09-29 DIAGNOSIS — E1142 Type 2 diabetes mellitus with diabetic polyneuropathy: Secondary | ICD-10-CM | POA: Diagnosis not present

## 2018-10-07 ENCOUNTER — Ambulatory Visit: Payer: Medicare HMO | Admitting: Sports Medicine

## 2018-10-08 ENCOUNTER — Ambulatory Visit (INDEPENDENT_AMBULATORY_CARE_PROVIDER_SITE_OTHER): Payer: Medicare HMO | Admitting: Sports Medicine

## 2018-10-08 ENCOUNTER — Encounter: Payer: Self-pay | Admitting: Sports Medicine

## 2018-10-08 VITALS — BP 105/66 | HR 70 | Resp 16

## 2018-10-08 DIAGNOSIS — M79674 Pain in right toe(s): Secondary | ICD-10-CM

## 2018-10-08 DIAGNOSIS — B351 Tinea unguium: Secondary | ICD-10-CM | POA: Diagnosis not present

## 2018-10-08 DIAGNOSIS — L84 Corns and callosities: Secondary | ICD-10-CM

## 2018-10-08 DIAGNOSIS — E1142 Type 2 diabetes mellitus with diabetic polyneuropathy: Secondary | ICD-10-CM

## 2018-10-08 DIAGNOSIS — H524 Presbyopia: Secondary | ICD-10-CM | POA: Diagnosis not present

## 2018-10-08 DIAGNOSIS — M79675 Pain in left toe(s): Secondary | ICD-10-CM | POA: Diagnosis not present

## 2018-10-08 NOTE — Progress Notes (Signed)
Subjective: [name] [name] is a 78 y.o. female patient with history of diabetes who presents to office today complaining of long,mildly painful nails  while ambulating in shoes; unable to trim. Patient states that the glucose reading this morning was not recorded however last A1c was 6.4, in December when she saw Dr. Nyra Capes.   Patient denies any other pedal complaints.  There are no active problems to display for this patient.  Current Outpatient Medications on File Prior to Visit  Medication Sig Dispense Refill  . levothyroxine (SYNTHROID, LEVOTHROID) 50 MCG tablet Take 50 mcg by mouth daily before breakfast.    . lisinopril-hydrochlorothiazide (PRINZIDE,ZESTORETIC) 20-12.5 MG per tablet Take 1 tablet by mouth. (once or 2 times daily??)     . pravastatin (PRAVACHOL) 40 MG tablet Take 40 mg by mouth daily.     No current facility-administered medications on file prior to visit.    Allergies  Allergen Reactions  . Penicillins     No results found for this or any previous visit (from the past 2160 hour(s)).  Objective: General: Patient is awake, alert, and oriented x 3 and in no acute distress.  Integument: Skin is warm, dry and supple bilateral. Nails are tender, long, thickened and  dystrophic with subungual debris, consistent with onychomycosis, 1-5 bilateral. No signs of infection.  Minimal callus right sub-met 1 and left medial first toe.  No open lesions or preulcerative lesions present bilateral. Remaining integument unremarkable.  Vasculature:  Dorsalis Pedis pulse 2/4 bilateral. Posterior Tibial pulse  1/4 bilateral.  Capillary fill time <3 sec 1-5 bilateral. Positive hair growth to the level of the digits. Temperature gradient within normal limits.  Mild varicosities and venous pigmentation present bilateral.  Trace edema present bilateral.   Neurology: The patient has diminished sensation measured with a 5.07/10g Semmes Weinstein Monofilament at all pedal sites bilateral .  Vibratory sensation diminished bilateral with tuning fork. No Babinski sign present bilateral.   Musculoskeletal: Asymptomatic hammertoe and pes planus pedal deformities noted bilateral. Muscular strength 5/5 in all lower extremity muscular groups bilateral without pain on range of motion . No tenderness with calf compression bilateral.  Assessment and Plan: Problem List Items Addressed This Visit    None    Visit Diagnoses    Pain due to onychomycosis of toenails of both feet    -  Primary   Diabetic polyneuropathy associated with type 2 diabetes mellitus (HCC)       Foot callus         -Examined patient. -Discussed and educated patient on diabetic foot care, especially with  regards to the vascular, neurological and musculoskeletal systems.  -Mechanically debrided all nails 1-5 bilateral using sterile nail nipper and filed with dremel without incident  -Mechanically smooth callus using rotary bur without incident recommend [place] healthy feet cream and pumice stone as needed -Continue with diabetic shoes -Patient to return in 3 months for at risk foot care as scheduled and return for diabetic shoe measurements as scheduled -Patient advised to call the office if any problems or questions arise in the meantime.  Landis Martins, DPM

## 2018-10-13 DIAGNOSIS — E119 Type 2 diabetes mellitus without complications: Secondary | ICD-10-CM | POA: Diagnosis not present

## 2018-10-13 DIAGNOSIS — E7800 Pure hypercholesterolemia, unspecified: Secondary | ICD-10-CM | POA: Diagnosis not present

## 2018-10-13 DIAGNOSIS — K5730 Diverticulosis of large intestine without perforation or abscess without bleeding: Secondary | ICD-10-CM | POA: Diagnosis not present

## 2018-10-13 DIAGNOSIS — K635 Polyp of colon: Secondary | ICD-10-CM | POA: Diagnosis not present

## 2018-10-13 DIAGNOSIS — D122 Benign neoplasm of ascending colon: Secondary | ICD-10-CM | POA: Diagnosis not present

## 2018-10-13 DIAGNOSIS — J45909 Unspecified asthma, uncomplicated: Secondary | ICD-10-CM | POA: Diagnosis not present

## 2018-10-13 DIAGNOSIS — I10 Essential (primary) hypertension: Secondary | ICD-10-CM | POA: Diagnosis not present

## 2018-10-13 DIAGNOSIS — Z1589 Genetic susceptibility to other disease: Secondary | ICD-10-CM | POA: Diagnosis not present

## 2018-10-13 DIAGNOSIS — Z1211 Encounter for screening for malignant neoplasm of colon: Secondary | ICD-10-CM | POA: Diagnosis not present

## 2018-10-13 DIAGNOSIS — D126 Benign neoplasm of colon, unspecified: Secondary | ICD-10-CM | POA: Diagnosis not present

## 2018-10-13 DIAGNOSIS — E039 Hypothyroidism, unspecified: Secondary | ICD-10-CM | POA: Diagnosis not present

## 2018-10-23 DIAGNOSIS — Z9011 Acquired absence of right breast and nipple: Secondary | ICD-10-CM | POA: Diagnosis not present

## 2018-10-23 DIAGNOSIS — Z1231 Encounter for screening mammogram for malignant neoplasm of breast: Secondary | ICD-10-CM | POA: Diagnosis not present

## 2018-10-30 DIAGNOSIS — I129 Hypertensive chronic kidney disease with stage 1 through stage 4 chronic kidney disease, or unspecified chronic kidney disease: Secondary | ICD-10-CM | POA: Diagnosis not present

## 2018-10-30 DIAGNOSIS — E1142 Type 2 diabetes mellitus with diabetic polyneuropathy: Secondary | ICD-10-CM | POA: Diagnosis not present

## 2018-10-30 DIAGNOSIS — N183 Chronic kidney disease, stage 3 (moderate): Secondary | ICD-10-CM | POA: Diagnosis not present

## 2018-10-30 DIAGNOSIS — E782 Mixed hyperlipidemia: Secondary | ICD-10-CM | POA: Diagnosis not present

## 2018-11-02 DIAGNOSIS — Z86000 Personal history of in-situ neoplasm of breast: Secondary | ICD-10-CM | POA: Diagnosis not present

## 2018-11-02 DIAGNOSIS — Z79810 Long term (current) use of selective estrogen receptor modulators (SERMs): Secondary | ICD-10-CM | POA: Diagnosis not present

## 2018-11-02 DIAGNOSIS — M8589 Other specified disorders of bone density and structure, multiple sites: Secondary | ICD-10-CM | POA: Diagnosis not present

## 2018-11-27 DIAGNOSIS — N183 Chronic kidney disease, stage 3 (moderate): Secondary | ICD-10-CM | POA: Diagnosis not present

## 2018-11-27 DIAGNOSIS — I129 Hypertensive chronic kidney disease with stage 1 through stage 4 chronic kidney disease, or unspecified chronic kidney disease: Secondary | ICD-10-CM | POA: Diagnosis not present

## 2018-11-27 DIAGNOSIS — E1142 Type 2 diabetes mellitus with diabetic polyneuropathy: Secondary | ICD-10-CM | POA: Diagnosis not present

## 2018-11-27 DIAGNOSIS — E782 Mixed hyperlipidemia: Secondary | ICD-10-CM | POA: Diagnosis not present

## 2018-12-29 DIAGNOSIS — E782 Mixed hyperlipidemia: Secondary | ICD-10-CM | POA: Diagnosis not present

## 2018-12-29 DIAGNOSIS — I129 Hypertensive chronic kidney disease with stage 1 through stage 4 chronic kidney disease, or unspecified chronic kidney disease: Secondary | ICD-10-CM | POA: Diagnosis not present

## 2018-12-29 DIAGNOSIS — N183 Chronic kidney disease, stage 3 (moderate): Secondary | ICD-10-CM | POA: Diagnosis not present

## 2018-12-29 DIAGNOSIS — E1142 Type 2 diabetes mellitus with diabetic polyneuropathy: Secondary | ICD-10-CM | POA: Diagnosis not present

## 2019-01-04 DIAGNOSIS — I129 Hypertensive chronic kidney disease with stage 1 through stage 4 chronic kidney disease, or unspecified chronic kidney disease: Secondary | ICD-10-CM | POA: Diagnosis not present

## 2019-01-04 DIAGNOSIS — E1142 Type 2 diabetes mellitus with diabetic polyneuropathy: Secondary | ICD-10-CM | POA: Diagnosis not present

## 2019-01-04 DIAGNOSIS — E782 Mixed hyperlipidemia: Secondary | ICD-10-CM | POA: Diagnosis not present

## 2019-01-06 ENCOUNTER — Other Ambulatory Visit: Payer: Self-pay

## 2019-01-06 ENCOUNTER — Encounter: Payer: Self-pay | Admitting: Sports Medicine

## 2019-01-06 ENCOUNTER — Ambulatory Visit: Payer: Medicare HMO | Admitting: Sports Medicine

## 2019-01-06 VITALS — BP 104/63 | HR 77 | Temp 99.3°F | Resp 16

## 2019-01-06 DIAGNOSIS — M79674 Pain in right toe(s): Secondary | ICD-10-CM

## 2019-01-06 DIAGNOSIS — M79675 Pain in left toe(s): Secondary | ICD-10-CM | POA: Diagnosis not present

## 2019-01-06 DIAGNOSIS — E1142 Type 2 diabetes mellitus with diabetic polyneuropathy: Secondary | ICD-10-CM | POA: Diagnosis not present

## 2019-01-06 DIAGNOSIS — B351 Tinea unguium: Secondary | ICD-10-CM

## 2019-01-06 NOTE — Progress Notes (Signed)
Subjective: [name] [name] is a 78 y.o. female patient with history of diabetes who presents to office today complaining of long,mildly painful nails  while ambulating in shoes; unable to trim. Patient states that the glucose reading this morning was not recorded/ DONT CHECK however last A1c was 6.4, 3 months ago when she saw Dr. Nyra Capes.   Patient denies any other pedal complaints.  There are no active problems to display for this patient.  Current Outpatient Medications on File Prior to Visit  Medication Sig Dispense Refill  . levothyroxine (SYNTHROID, LEVOTHROID) 50 MCG tablet Take 50 mcg by mouth daily before breakfast.    . lisinopril-hydrochlorothiazide (PRINZIDE,ZESTORETIC) 20-12.5 MG per tablet Take 1 tablet by mouth. (once or 2 times daily??)     . pravastatin (PRAVACHOL) 40 MG tablet Take 40 mg by mouth daily.     No current facility-administered medications on file prior to visit.    Allergies  Allergen Reactions  . Penicillins     No results found for this or any previous visit (from the past 2160 hour(s)).  Objective: General: Patient is awake, alert, and oriented x 3 and in no acute distress.  Integument: Skin is warm, dry and supple bilateral. Nails are tender, long, thickened and  dystrophic with subungual debris, consistent with onychomycosis, 1-5 bilateral. No signs of infection.  Minimal callus right sub-met 1 and left medial first toe.  No open lesions or preulcerative lesions present bilateral. Remaining integument unremarkable.  Vasculature:  Dorsalis Pedis pulse 2/4 bilateral. Posterior Tibial pulse  1/4 bilateral.  Capillary fill time <3 sec 1-5 bilateral. Positive hair growth to the level of the digits. Temperature gradient within normal limits.  Mild varicosities and venous pigmentation present bilateral.  Trace edema present bilateral.   Neurology: The patient has diminished sensation measured with a 5.07/10g Semmes Weinstein Monofilament at all pedal sites  bilateral . Vibratory sensation diminished bilateral with tuning fork. No Babinski sign present bilateral.   Musculoskeletal: Asymptomatic hammertoe and pes planus pedal deformities noted bilateral. Muscular strength 5/5 in all lower extremity muscular groups bilateral without pain on range of motion . No tenderness with calf compression bilateral.  Assessment and Plan: Problem List Items Addressed This Visit    None    Visit Diagnoses    Pain due to onychomycosis of toenails of both feet    -  Primary   Diabetic polyneuropathy associated with type 2 diabetes mellitus ([place])         -Examined patient. -Re-Discussed and educated patient on diabetic foot care, especially with  regards to the vascular, neurological and musculoskeletal systems.  -Mechanically debrided all nails 1-5 bilateral using sterile nail nipper and filed with dremel without incident  -Mechanically smooth callus using rotary bur without incident -Continue with diabetic shoes -Patient to return in 3 months for at risk foot care as scheduled and return for diabetic shoe measurements as scheduled -Patient advised to call the office if any problems or questions arise in the meantime.  Landis Martins, DPM

## 2019-01-11 DIAGNOSIS — E1169 Type 2 diabetes mellitus with other specified complication: Secondary | ICD-10-CM | POA: Diagnosis not present

## 2019-01-11 DIAGNOSIS — N183 Chronic kidney disease, stage 3 (moderate): Secondary | ICD-10-CM | POA: Diagnosis not present

## 2019-01-11 DIAGNOSIS — I129 Hypertensive chronic kidney disease with stage 1 through stage 4 chronic kidney disease, or unspecified chronic kidney disease: Secondary | ICD-10-CM | POA: Diagnosis not present

## 2019-01-11 DIAGNOSIS — E782 Mixed hyperlipidemia: Secondary | ICD-10-CM | POA: Diagnosis not present

## 2019-01-28 DIAGNOSIS — I129 Hypertensive chronic kidney disease with stage 1 through stage 4 chronic kidney disease, or unspecified chronic kidney disease: Secondary | ICD-10-CM | POA: Diagnosis not present

## 2019-01-28 DIAGNOSIS — E782 Mixed hyperlipidemia: Secondary | ICD-10-CM | POA: Diagnosis not present

## 2019-01-28 DIAGNOSIS — E1169 Type 2 diabetes mellitus with other specified complication: Secondary | ICD-10-CM | POA: Diagnosis not present

## 2019-01-28 DIAGNOSIS — N183 Chronic kidney disease, stage 3 (moderate): Secondary | ICD-10-CM | POA: Diagnosis not present

## 2019-02-10 DIAGNOSIS — E039 Hypothyroidism, unspecified: Secondary | ICD-10-CM | POA: Diagnosis not present

## 2019-02-10 DIAGNOSIS — Z139 Encounter for screening, unspecified: Secondary | ICD-10-CM | POA: Diagnosis not present

## 2019-02-10 DIAGNOSIS — Z1331 Encounter for screening for depression: Secondary | ICD-10-CM | POA: Diagnosis not present

## 2019-02-10 DIAGNOSIS — Z0000 Encounter for general adult medical examination without abnormal findings: Secondary | ICD-10-CM | POA: Diagnosis not present

## 2019-02-26 DIAGNOSIS — E1169 Type 2 diabetes mellitus with other specified complication: Secondary | ICD-10-CM | POA: Diagnosis not present

## 2019-02-26 DIAGNOSIS — I129 Hypertensive chronic kidney disease with stage 1 through stage 4 chronic kidney disease, or unspecified chronic kidney disease: Secondary | ICD-10-CM | POA: Diagnosis not present

## 2019-02-26 DIAGNOSIS — E782 Mixed hyperlipidemia: Secondary | ICD-10-CM | POA: Diagnosis not present

## 2019-02-26 DIAGNOSIS — N183 Chronic kidney disease, stage 3 (moderate): Secondary | ICD-10-CM | POA: Diagnosis not present

## 2019-03-04 DIAGNOSIS — E039 Hypothyroidism, unspecified: Secondary | ICD-10-CM | POA: Diagnosis not present

## 2019-03-04 DIAGNOSIS — Z6838 Body mass index (BMI) 38.0-38.9, adult: Secondary | ICD-10-CM | POA: Diagnosis not present

## 2019-03-04 DIAGNOSIS — N183 Chronic kidney disease, stage 3 (moderate): Secondary | ICD-10-CM | POA: Diagnosis not present

## 2019-03-30 DIAGNOSIS — Z6838 Body mass index (BMI) 38.0-38.9, adult: Secondary | ICD-10-CM | POA: Diagnosis not present

## 2019-03-30 DIAGNOSIS — E039 Hypothyroidism, unspecified: Secondary | ICD-10-CM | POA: Diagnosis not present

## 2019-03-30 DIAGNOSIS — E1169 Type 2 diabetes mellitus with other specified complication: Secondary | ICD-10-CM | POA: Diagnosis not present

## 2019-03-30 DIAGNOSIS — N183 Chronic kidney disease, stage 3 (moderate): Secondary | ICD-10-CM | POA: Diagnosis not present

## 2019-03-30 DIAGNOSIS — I129 Hypertensive chronic kidney disease with stage 1 through stage 4 chronic kidney disease, or unspecified chronic kidney disease: Secondary | ICD-10-CM | POA: Diagnosis not present

## 2019-04-07 ENCOUNTER — Ambulatory Visit: Payer: Medicare HMO | Admitting: Sports Medicine

## 2019-04-14 ENCOUNTER — Other Ambulatory Visit: Payer: Self-pay

## 2019-04-14 ENCOUNTER — Encounter: Payer: Self-pay | Admitting: Sports Medicine

## 2019-04-14 ENCOUNTER — Ambulatory Visit (INDEPENDENT_AMBULATORY_CARE_PROVIDER_SITE_OTHER): Payer: Medicare HMO | Admitting: Sports Medicine

## 2019-04-14 VITALS — Temp 97.7°F | Resp 16

## 2019-04-14 DIAGNOSIS — B351 Tinea unguium: Secondary | ICD-10-CM | POA: Diagnosis not present

## 2019-04-14 DIAGNOSIS — M79675 Pain in left toe(s): Secondary | ICD-10-CM

## 2019-04-14 DIAGNOSIS — M79674 Pain in right toe(s): Secondary | ICD-10-CM | POA: Diagnosis not present

## 2019-04-14 DIAGNOSIS — E1142 Type 2 diabetes mellitus with diabetic polyneuropathy: Secondary | ICD-10-CM

## 2019-04-14 NOTE — Progress Notes (Signed)
Subjective: [name] [name] is a 78 y.o. female patient with history of diabetes who presents to office today complaining of long,mildly painful nails  while ambulating in shoes; unable to trim. Patient states that the glucose reading this morning was not recorded/ DONT CHECK however last A1c was 6.4, 2 to 3 weeks ago when she saw Dr. Nyra Capes.  Patient also admits to occasional pain especially now that she has been doing a lot more gardening at the right medial aspect of the first toe at the interphalangeal joint for the last 3 months reports that the pain is 2-3 out of 10 off and on only hurts was walking excessively and stooping and squatting in the garden.  Patient denies any redness warmth swelling drainage or any open lesions.  Patient denies any other pedal complaints.  There are no active problems to display for this patient.  Current Outpatient Medications on File Prior to Visit  Medication Sig Dispense Refill  . levothyroxine (SYNTHROID, LEVOTHROID) 50 MCG tablet Take 50 mcg by mouth daily before breakfast.    . lisinopril-hydrochlorothiazide (PRINZIDE,ZESTORETIC) 20-12.5 MG per tablet Take 1 tablet by mouth. (once or 2 times daily??)     . pravastatin (PRAVACHOL) 40 MG tablet Take 40 mg by mouth daily.     No current facility-administered medications on file prior to visit.    Allergies  Allergen Reactions  . Penicillins     No results found for this or any previous visit (from the past 2160 hour(s)).  Objective: General: Patient is awake, alert, and oriented x 3 and in no acute distress.  Integument: Skin is warm, dry and supple bilateral. Nails are tender, long, thickened and  dystrophic with subungual debris, consistent with onychomycosis, 1-5 bilateral. No signs of infection.  Minimal callus right sub-met 1 and left medial first toe.  No open lesions or preulcerative lesions present bilateral. Remaining integument unremarkable.  Vasculature:  Dorsalis Pedis pulse 2/4  bilateral. Posterior Tibial pulse  1/4 bilateral.  Capillary fill time <3 sec 1-5 bilateral. Positive hair growth to the level of the digits. Temperature gradient within normal limits.  Mild varicosities and venous pigmentation present bilateral.  Trace edema present bilateral.   Neurology: The patient has diminished sensation measured with a 5.07/10g Semmes Weinstein Monofilament at all pedal sites bilateral . Vibratory sensation diminished bilateral with tuning fork. No Babinski sign present bilateral.   Musculoskeletal: Asymptomatic hammertoe and pes planus pedal deformities noted bilateral.  No reproducible tenderness at the right hallux IPJ but patient does report that she gets occasional pain here with walking and standing.  Muscular strength 5/5 in all lower extremity muscular groups bilateral without pain on range of motion . No tenderness with calf compression bilateral.  Patient declined x-rays for the right foot at this visit  Assessment and Plan: Problem List Items Addressed This Visit    None    Visit Diagnoses    Pain due to onychomycosis of toenails of both feet    -  Primary   Diabetic polyneuropathy associated with type 2 diabetes mellitus ([place])         -Examined patient. -Re-Discussed and educated patient on diabetic foot care, especially with  regards to the vascular, neurological and musculoskeletal systems.  -Mechanically debrided all nails 1-5 bilateral using sterile nail nipper and filed with dremel without incident  -Mechanically smooth callus using rotary bur without incident and dispensed toe foam for the great toe joint pain and advised patient if pain continues will benefit from  an x-ray to further evaluation and work-up of his pain at the right hallux IPJ medial aspect -Continue with diabetic shoes -Patient to return in 3 months for at risk foot care -Patient advised to call the office if any problems or questions arise in the meantime.  Landis Martins, DPM

## 2019-04-15 DIAGNOSIS — E039 Hypothyroidism, unspecified: Secondary | ICD-10-CM | POA: Diagnosis not present

## 2019-04-22 DIAGNOSIS — E039 Hypothyroidism, unspecified: Secondary | ICD-10-CM | POA: Diagnosis not present

## 2019-04-22 DIAGNOSIS — Z6836 Body mass index (BMI) 36.0-36.9, adult: Secondary | ICD-10-CM | POA: Diagnosis not present

## 2019-04-30 DIAGNOSIS — I129 Hypertensive chronic kidney disease with stage 1 through stage 4 chronic kidney disease, or unspecified chronic kidney disease: Secondary | ICD-10-CM | POA: Diagnosis not present

## 2019-04-30 DIAGNOSIS — N183 Chronic kidney disease, stage 3 (moderate): Secondary | ICD-10-CM | POA: Diagnosis not present

## 2019-04-30 DIAGNOSIS — E039 Hypothyroidism, unspecified: Secondary | ICD-10-CM | POA: Diagnosis not present

## 2019-05-06 DIAGNOSIS — E1169 Type 2 diabetes mellitus with other specified complication: Secondary | ICD-10-CM | POA: Diagnosis not present

## 2019-05-06 DIAGNOSIS — I129 Hypertensive chronic kidney disease with stage 1 through stage 4 chronic kidney disease, or unspecified chronic kidney disease: Secondary | ICD-10-CM | POA: Diagnosis not present

## 2019-05-08 DIAGNOSIS — S42352A Displaced comminuted fracture of shaft of humerus, left arm, initial encounter for closed fracture: Secondary | ICD-10-CM | POA: Diagnosis not present

## 2019-05-08 DIAGNOSIS — S42212A Unspecified displaced fracture of surgical neck of left humerus, initial encounter for closed fracture: Secondary | ICD-10-CM | POA: Diagnosis not present

## 2019-05-08 DIAGNOSIS — S199XXA Unspecified injury of neck, initial encounter: Secondary | ICD-10-CM | POA: Diagnosis not present

## 2019-05-08 DIAGNOSIS — S42295A Other nondisplaced fracture of upper end of left humerus, initial encounter for closed fracture: Secondary | ICD-10-CM | POA: Diagnosis not present

## 2019-05-08 DIAGNOSIS — S42202A Unspecified fracture of upper end of left humerus, initial encounter for closed fracture: Secondary | ICD-10-CM | POA: Diagnosis not present

## 2019-05-13 DIAGNOSIS — C50919 Malignant neoplasm of unspecified site of unspecified female breast: Secondary | ICD-10-CM | POA: Diagnosis not present

## 2019-05-13 DIAGNOSIS — N183 Chronic kidney disease, stage 3 (moderate): Secondary | ICD-10-CM | POA: Diagnosis not present

## 2019-05-13 DIAGNOSIS — E782 Mixed hyperlipidemia: Secondary | ICD-10-CM | POA: Diagnosis not present

## 2019-05-13 DIAGNOSIS — E1142 Type 2 diabetes mellitus with diabetic polyneuropathy: Secondary | ICD-10-CM | POA: Diagnosis not present

## 2019-05-13 DIAGNOSIS — E1169 Type 2 diabetes mellitus with other specified complication: Secondary | ICD-10-CM | POA: Diagnosis not present

## 2019-05-27 DIAGNOSIS — S42202A Unspecified fracture of upper end of left humerus, initial encounter for closed fracture: Secondary | ICD-10-CM | POA: Diagnosis not present

## 2019-05-31 DIAGNOSIS — E559 Vitamin D deficiency, unspecified: Secondary | ICD-10-CM | POA: Diagnosis not present

## 2019-05-31 DIAGNOSIS — Z01818 Encounter for other preprocedural examination: Secondary | ICD-10-CM | POA: Diagnosis not present

## 2019-05-31 DIAGNOSIS — N183 Chronic kidney disease, stage 3 (moderate): Secondary | ICD-10-CM | POA: Diagnosis not present

## 2019-05-31 DIAGNOSIS — E782 Mixed hyperlipidemia: Secondary | ICD-10-CM | POA: Diagnosis not present

## 2019-05-31 DIAGNOSIS — I129 Hypertensive chronic kidney disease with stage 1 through stage 4 chronic kidney disease, or unspecified chronic kidney disease: Secondary | ICD-10-CM | POA: Diagnosis not present

## 2019-05-31 DIAGNOSIS — R52 Pain, unspecified: Secondary | ICD-10-CM | POA: Diagnosis not present

## 2019-05-31 DIAGNOSIS — Z79899 Other long term (current) drug therapy: Secondary | ICD-10-CM | POA: Diagnosis not present

## 2019-05-31 DIAGNOSIS — M25812 Other specified joint disorders, left shoulder: Secondary | ICD-10-CM | POA: Diagnosis not present

## 2019-05-31 DIAGNOSIS — M79609 Pain in unspecified limb: Secondary | ICD-10-CM | POA: Diagnosis not present

## 2019-05-31 DIAGNOSIS — E039 Hypothyroidism, unspecified: Secondary | ICD-10-CM | POA: Diagnosis not present

## 2019-06-01 DIAGNOSIS — S42292A Other displaced fracture of upper end of left humerus, initial encounter for closed fracture: Secondary | ICD-10-CM | POA: Diagnosis not present

## 2019-06-01 DIAGNOSIS — S42202A Unspecified fracture of upper end of left humerus, initial encounter for closed fracture: Secondary | ICD-10-CM | POA: Diagnosis not present

## 2019-06-03 DIAGNOSIS — R531 Weakness: Secondary | ICD-10-CM | POA: Diagnosis not present

## 2019-06-03 DIAGNOSIS — I10 Essential (primary) hypertension: Secondary | ICD-10-CM | POA: Diagnosis not present

## 2019-06-03 DIAGNOSIS — G8918 Other acute postprocedural pain: Secondary | ICD-10-CM | POA: Diagnosis not present

## 2019-06-03 DIAGNOSIS — S42352D Displaced comminuted fracture of shaft of humerus, left arm, subsequent encounter for fracture with routine healing: Secondary | ICD-10-CM | POA: Diagnosis not present

## 2019-06-03 DIAGNOSIS — Z9181 History of falling: Secondary | ICD-10-CM | POA: Diagnosis not present

## 2019-06-03 DIAGNOSIS — E039 Hypothyroidism, unspecified: Secondary | ICD-10-CM | POA: Diagnosis not present

## 2019-06-03 DIAGNOSIS — K589 Irritable bowel syndrome without diarrhea: Secondary | ICD-10-CM | POA: Diagnosis not present

## 2019-06-03 DIAGNOSIS — E119 Type 2 diabetes mellitus without complications: Secondary | ICD-10-CM | POA: Diagnosis not present

## 2019-06-03 DIAGNOSIS — M1990 Unspecified osteoarthritis, unspecified site: Secondary | ICD-10-CM | POA: Diagnosis not present

## 2019-06-03 DIAGNOSIS — S42242A 4-part fracture of surgical neck of left humerus, initial encounter for closed fracture: Secondary | ICD-10-CM | POA: Diagnosis not present

## 2019-06-03 DIAGNOSIS — S42202A Unspecified fracture of upper end of left humerus, initial encounter for closed fracture: Secondary | ICD-10-CM | POA: Diagnosis not present

## 2019-06-03 DIAGNOSIS — E785 Hyperlipidemia, unspecified: Secondary | ICD-10-CM | POA: Diagnosis not present

## 2019-06-17 DIAGNOSIS — S42202A Unspecified fracture of upper end of left humerus, initial encounter for closed fracture: Secondary | ICD-10-CM | POA: Diagnosis not present

## 2019-06-30 DIAGNOSIS — E782 Mixed hyperlipidemia: Secondary | ICD-10-CM | POA: Diagnosis not present

## 2019-06-30 DIAGNOSIS — I129 Hypertensive chronic kidney disease with stage 1 through stage 4 chronic kidney disease, or unspecified chronic kidney disease: Secondary | ICD-10-CM | POA: Diagnosis not present

## 2019-06-30 DIAGNOSIS — E039 Hypothyroidism, unspecified: Secondary | ICD-10-CM | POA: Diagnosis not present

## 2019-06-30 DIAGNOSIS — N183 Chronic kidney disease, stage 3 (moderate): Secondary | ICD-10-CM | POA: Diagnosis not present

## 2019-07-14 ENCOUNTER — Encounter: Payer: Self-pay | Admitting: Sports Medicine

## 2019-07-14 ENCOUNTER — Other Ambulatory Visit: Payer: Self-pay | Admitting: Sports Medicine

## 2019-07-14 ENCOUNTER — Other Ambulatory Visit: Payer: Self-pay

## 2019-07-14 ENCOUNTER — Ambulatory Visit (INDEPENDENT_AMBULATORY_CARE_PROVIDER_SITE_OTHER): Payer: Medicare HMO | Admitting: Sports Medicine

## 2019-07-14 DIAGNOSIS — L84 Corns and callosities: Secondary | ICD-10-CM

## 2019-07-14 DIAGNOSIS — M79674 Pain in right toe(s): Secondary | ICD-10-CM | POA: Diagnosis not present

## 2019-07-14 DIAGNOSIS — M79675 Pain in left toe(s): Secondary | ICD-10-CM

## 2019-07-14 DIAGNOSIS — B351 Tinea unguium: Secondary | ICD-10-CM | POA: Diagnosis not present

## 2019-07-14 DIAGNOSIS — M79671 Pain in right foot: Secondary | ICD-10-CM

## 2019-07-14 DIAGNOSIS — E1142 Type 2 diabetes mellitus with diabetic polyneuropathy: Secondary | ICD-10-CM | POA: Diagnosis not present

## 2019-07-14 NOTE — Progress Notes (Signed)
Subjective: [name] [name] is a 78 y.o. female patient with history of diabetes who presents to office today complaining of long,mildly painful nails  while ambulating in shoes; unable to trim. Patient states that the glucose reading this morning was 104 a couple of months ago and last a1c 6.4, last PCP visit was in August with Dr. Nyra Capes. Reports that she broke her left arm and started PT this week.  Patient denies any other pedal complaints.  There are no active problems to display for this patient.  Current Outpatient Medications on File Prior to Visit  Medication Sig Dispense Refill  . levothyroxine (SYNTHROID) 75 MCG tablet     . levothyroxine (SYNTHROID, LEVOTHROID) 50 MCG tablet Take 50 mcg by mouth daily before breakfast.    . lisinopril-hydrochlorothiazide (PRINZIDE,ZESTORETIC) 20-12.5 MG per tablet Take 1 tablet by mouth. (once or 2 times daily??)     . oxyCODONE (OXY IR/ROXICODONE) 5 MG immediate release tablet TAKE 1 TABLET BY MOUTH EVERY 4 HOURS AS NEEDED FOR MODERATE TO SEVERE PAIN    . pravastatin (PRAVACHOL) 40 MG tablet Take 40 mg by mouth daily.     No current facility-administered medications on file prior to visit.    Allergies  Allergen Reactions  . Penicillins     No results found for this or any previous visit (from the past 2160 hour(s)).  Objective: General: Patient is awake, alert, and oriented x 3 and in no acute distress.  Integument: Skin is warm, dry and supple bilateral. Nails are tender, long, thickened and  dystrophic with subungual debris, consistent with onychomycosis, 1-5 bilateral. No signs of infection.  Minimal callus right sub-met 1 and left medial first toe.  No open lesions or preulcerative lesions present bilateral. Remaining integument unremarkable.  Vasculature:  Dorsalis Pedis pulse 2/4 bilateral. Posterior Tibial pulse  1/4 bilateral.  Capillary fill time <3 sec 1-5 bilateral. Positive hair growth to the level of the digits. Temperature  gradient within normal limits.  Mild varicosities and venous pigmentation present bilateral.  Trace edema present bilateral.   Neurology: The patient has diminished sensation measured with a 5.07/10g Semmes Weinstein Monofilament at all pedal sites bilateral.  Musculoskeletal: Asymptomatic hammertoe and pes planus pedal deformities noted bilateral.  Assessment and Plan: Problem List Items Addressed This Visit    None    Visit Diagnoses    Pain due to onychomycosis of toenails of both feet    -  Primary   Diabetic polyneuropathy associated with type 2 diabetes mellitus ([place])       Foot callus         -Examined patient. -Re-Discussed and educated patient on diabetic foot care, especially with regards to the vascular, neurological and musculoskeletal systems.  -Mechanically debrided all nails 1-5 bilateral using sterile nail nipper and filed with dremel without incident  -Applied foot miracle cream and advised patient that if she likes this cream she can purchase from office or get [facility] -Continue with diabetic shoes -Patient to return in 3 months for at risk foot care -Patient advised to call the office if any problems or questions arise in the meantime.  Landis Martins, DPM

## 2019-07-14 NOTE — Patient Instructions (Signed)
[facility] from [facility]

## 2019-07-15 DIAGNOSIS — S42202A Unspecified fracture of upper end of left humerus, initial encounter for closed fracture: Secondary | ICD-10-CM | POA: Diagnosis not present

## 2019-07-16 DIAGNOSIS — S42202D Unspecified fracture of upper end of left humerus, subsequent encounter for fracture with routine healing: Secondary | ICD-10-CM | POA: Diagnosis not present

## 2019-07-16 DIAGNOSIS — M25512 Pain in left shoulder: Secondary | ICD-10-CM | POA: Diagnosis not present

## 2019-07-21 DIAGNOSIS — M85852 Other specified disorders of bone density and structure, left thigh: Secondary | ICD-10-CM | POA: Diagnosis not present

## 2019-07-21 DIAGNOSIS — M8589 Other specified disorders of bone density and structure, multiple sites: Secondary | ICD-10-CM | POA: Diagnosis not present

## 2019-07-27 DIAGNOSIS — S42202D Unspecified fracture of upper end of left humerus, subsequent encounter for fracture with routine healing: Secondary | ICD-10-CM | POA: Diagnosis not present

## 2019-07-27 DIAGNOSIS — M25512 Pain in left shoulder: Secondary | ICD-10-CM | POA: Diagnosis not present

## 2019-07-29 DIAGNOSIS — I129 Hypertensive chronic kidney disease with stage 1 through stage 4 chronic kidney disease, or unspecified chronic kidney disease: Secondary | ICD-10-CM | POA: Diagnosis not present

## 2019-07-29 DIAGNOSIS — E782 Mixed hyperlipidemia: Secondary | ICD-10-CM | POA: Diagnosis not present

## 2019-07-30 DIAGNOSIS — E782 Mixed hyperlipidemia: Secondary | ICD-10-CM | POA: Diagnosis not present

## 2019-07-30 DIAGNOSIS — E039 Hypothyroidism, unspecified: Secondary | ICD-10-CM | POA: Diagnosis not present

## 2019-07-30 DIAGNOSIS — I129 Hypertensive chronic kidney disease with stage 1 through stage 4 chronic kidney disease, or unspecified chronic kidney disease: Secondary | ICD-10-CM | POA: Diagnosis not present

## 2019-08-03 DIAGNOSIS — S42202D Unspecified fracture of upper end of left humerus, subsequent encounter for fracture with routine healing: Secondary | ICD-10-CM | POA: Diagnosis not present

## 2019-08-03 DIAGNOSIS — M25512 Pain in left shoulder: Secondary | ICD-10-CM | POA: Diagnosis not present

## 2019-08-05 DIAGNOSIS — S42202D Unspecified fracture of upper end of left humerus, subsequent encounter for fracture with routine healing: Secondary | ICD-10-CM | POA: Diagnosis not present

## 2019-08-05 DIAGNOSIS — M25512 Pain in left shoulder: Secondary | ICD-10-CM | POA: Diagnosis not present

## 2019-08-09 DIAGNOSIS — E1169 Type 2 diabetes mellitus with other specified complication: Secondary | ICD-10-CM | POA: Diagnosis not present

## 2019-08-10 DIAGNOSIS — S42202D Unspecified fracture of upper end of left humerus, subsequent encounter for fracture with routine healing: Secondary | ICD-10-CM | POA: Diagnosis not present

## 2019-08-10 DIAGNOSIS — M25512 Pain in left shoulder: Secondary | ICD-10-CM | POA: Diagnosis not present

## 2019-08-12 DIAGNOSIS — M25512 Pain in left shoulder: Secondary | ICD-10-CM | POA: Diagnosis not present

## 2019-08-12 DIAGNOSIS — S42202D Unspecified fracture of upper end of left humerus, subsequent encounter for fracture with routine healing: Secondary | ICD-10-CM | POA: Diagnosis not present

## 2019-08-16 DIAGNOSIS — Z23 Encounter for immunization: Secondary | ICD-10-CM | POA: Diagnosis not present

## 2019-08-16 DIAGNOSIS — E1142 Type 2 diabetes mellitus with diabetic polyneuropathy: Secondary | ICD-10-CM | POA: Diagnosis not present

## 2019-08-16 DIAGNOSIS — N1831 Chronic kidney disease, stage 3a: Secondary | ICD-10-CM | POA: Diagnosis not present

## 2019-08-16 DIAGNOSIS — E1169 Type 2 diabetes mellitus with other specified complication: Secondary | ICD-10-CM | POA: Diagnosis not present

## 2019-08-16 DIAGNOSIS — E039 Hypothyroidism, unspecified: Secondary | ICD-10-CM | POA: Diagnosis not present

## 2019-08-16 DIAGNOSIS — E782 Mixed hyperlipidemia: Secondary | ICD-10-CM | POA: Diagnosis not present

## 2019-08-17 DIAGNOSIS — S42202D Unspecified fracture of upper end of left humerus, subsequent encounter for fracture with routine healing: Secondary | ICD-10-CM | POA: Diagnosis not present

## 2019-08-17 DIAGNOSIS — M25512 Pain in left shoulder: Secondary | ICD-10-CM | POA: Diagnosis not present

## 2019-08-29 DIAGNOSIS — E782 Mixed hyperlipidemia: Secondary | ICD-10-CM | POA: Diagnosis not present

## 2019-08-29 DIAGNOSIS — E1169 Type 2 diabetes mellitus with other specified complication: Secondary | ICD-10-CM | POA: Diagnosis not present

## 2019-08-29 DIAGNOSIS — E1142 Type 2 diabetes mellitus with diabetic polyneuropathy: Secondary | ICD-10-CM | POA: Diagnosis not present

## 2019-08-29 DIAGNOSIS — I129 Hypertensive chronic kidney disease with stage 1 through stage 4 chronic kidney disease, or unspecified chronic kidney disease: Secondary | ICD-10-CM | POA: Diagnosis not present

## 2019-08-30 DIAGNOSIS — I129 Hypertensive chronic kidney disease with stage 1 through stage 4 chronic kidney disease, or unspecified chronic kidney disease: Secondary | ICD-10-CM | POA: Diagnosis not present

## 2019-09-29 DIAGNOSIS — I129 Hypertensive chronic kidney disease with stage 1 through stage 4 chronic kidney disease, or unspecified chronic kidney disease: Secondary | ICD-10-CM | POA: Diagnosis not present

## 2019-09-29 DIAGNOSIS — E1142 Type 2 diabetes mellitus with diabetic polyneuropathy: Secondary | ICD-10-CM | POA: Diagnosis not present

## 2019-09-29 DIAGNOSIS — E782 Mixed hyperlipidemia: Secondary | ICD-10-CM | POA: Diagnosis not present

## 2019-09-29 DIAGNOSIS — E1169 Type 2 diabetes mellitus with other specified complication: Secondary | ICD-10-CM | POA: Diagnosis not present

## 2019-09-30 DIAGNOSIS — I129 Hypertensive chronic kidney disease with stage 1 through stage 4 chronic kidney disease, or unspecified chronic kidney disease: Secondary | ICD-10-CM | POA: Diagnosis not present

## 2019-10-13 ENCOUNTER — Ambulatory Visit: Payer: Medicare HMO | Admitting: Sports Medicine

## 2019-10-22 ENCOUNTER — Ambulatory Visit: Payer: Medicare HMO | Admitting: Sports Medicine

## 2019-10-22 ENCOUNTER — Other Ambulatory Visit: Payer: Self-pay

## 2019-10-22 ENCOUNTER — Encounter: Payer: Self-pay | Admitting: Sports Medicine

## 2019-10-22 DIAGNOSIS — B351 Tinea unguium: Secondary | ICD-10-CM

## 2019-10-22 DIAGNOSIS — E1142 Type 2 diabetes mellitus with diabetic polyneuropathy: Secondary | ICD-10-CM | POA: Diagnosis not present

## 2019-10-22 DIAGNOSIS — M79674 Pain in right toe(s): Secondary | ICD-10-CM | POA: Diagnosis not present

## 2019-10-22 DIAGNOSIS — M79675 Pain in left toe(s): Secondary | ICD-10-CM | POA: Diagnosis not present

## 2019-10-22 NOTE — Progress Notes (Signed)
Subjective: [name] [name] is a 79 y.o. female patient with history of diabetes who presents to office today complaining of long,mildly painful nails  while ambulating in shoes; unable to trim. Patient states that the glucose reading this morning was not checked and last a1c 6.4, last PCP visit with Dr. Nyra Capes was 3 months ago. Patient denies any other pedal complaints.  There are no problems to display for this patient.  Current Outpatient Medications on File Prior to Visit  Medication Sig Dispense Refill  . levothyroxine (SYNTHROID) 88 MCG tablet     . lisinopril-hydrochlorothiazide (PRINZIDE,ZESTORETIC) 20-12.5 MG per tablet Take 1 tablet by mouth. (once or 2 times daily??)     . oxyCODONE (OXY IR/ROXICODONE) 5 MG immediate release tablet TAKE 1 TABLET BY MOUTH EVERY 4 HOURS AS NEEDED FOR MODERATE TO SEVERE PAIN    . pravastatin (PRAVACHOL) 40 MG tablet Take 40 mg by mouth daily.     No current facility-administered medications on file prior to visit.   Allergies  Allergen Reactions  . Penicillins     No results found for this or any previous visit (from the past 2160 hour(s)).  Objective: General: Patient is awake, alert, and oriented x 3 and in no acute distress.  Integument: Skin is warm, dry and supple bilateral. Nails are tender, long, thickened and  dystrophic with subungual debris, consistent with onychomycosis, 1-5 bilateral. No signs of infection.  Minimal callus right sub-met 1 and left medial first toe.  No open lesions or preulcerative lesions present bilateral. Remaining integument unremarkable.  Vasculature:  Dorsalis Pedis pulse 2/4 bilateral. Posterior Tibial pulse  1/4 bilateral.  Capillary fill time <3 sec 1-5 bilateral. Positive hair growth to the level of the digits. Temperature gradient within normal limits.  Mild varicosities and venous pigmentation present bilateral.  Trace edema present bilateral.   Neurology: The patient has diminished sensation measured  with a 5.07/10g Semmes Weinstein Monofilament at all pedal sites bilateral.  Musculoskeletal: Asymptomatic hammertoe and pes planus pedal deformities noted bilateral.  Assessment and Plan: Problem List Items Addressed This Visit    None    Visit Diagnoses    Pain due to onychomycosis of toenails of both feet    -  Primary   Diabetic polyneuropathy associated with type 2 diabetes mellitus ([place])         -Examined patient. -Discussed and educated patient on diabetic foot care -Mechanically debrided all nails 1-5 bilateral using sterile nail nipper and filed with dremel without incident  -Patient to return in 3 months for at risk foot care -Patient advised to call the office if any problems or questions arise in the meantime.  Landis Martins, DPM

## 2019-10-28 DIAGNOSIS — I129 Hypertensive chronic kidney disease with stage 1 through stage 4 chronic kidney disease, or unspecified chronic kidney disease: Secondary | ICD-10-CM | POA: Diagnosis not present

## 2019-10-29 DIAGNOSIS — I129 Hypertensive chronic kidney disease with stage 1 through stage 4 chronic kidney disease, or unspecified chronic kidney disease: Secondary | ICD-10-CM | POA: Diagnosis not present

## 2019-10-29 DIAGNOSIS — E1142 Type 2 diabetes mellitus with diabetic polyneuropathy: Secondary | ICD-10-CM | POA: Diagnosis not present

## 2019-10-29 DIAGNOSIS — E1169 Type 2 diabetes mellitus with other specified complication: Secondary | ICD-10-CM | POA: Diagnosis not present

## 2019-10-29 DIAGNOSIS — E782 Mixed hyperlipidemia: Secondary | ICD-10-CM | POA: Diagnosis not present

## 2019-11-08 DIAGNOSIS — E1169 Type 2 diabetes mellitus with other specified complication: Secondary | ICD-10-CM | POA: Diagnosis not present

## 2019-11-08 DIAGNOSIS — E039 Hypothyroidism, unspecified: Secondary | ICD-10-CM | POA: Diagnosis not present

## 2019-11-12 DIAGNOSIS — Z1231 Encounter for screening mammogram for malignant neoplasm of breast: Secondary | ICD-10-CM | POA: Diagnosis not present

## 2019-11-23 DIAGNOSIS — E1142 Type 2 diabetes mellitus with diabetic polyneuropathy: Secondary | ICD-10-CM | POA: Diagnosis not present

## 2019-11-23 DIAGNOSIS — E039 Hypothyroidism, unspecified: Secondary | ICD-10-CM | POA: Diagnosis not present

## 2019-11-23 DIAGNOSIS — N1831 Chronic kidney disease, stage 3a: Secondary | ICD-10-CM | POA: Diagnosis not present

## 2019-11-23 DIAGNOSIS — E782 Mixed hyperlipidemia: Secondary | ICD-10-CM | POA: Diagnosis not present

## 2019-11-23 DIAGNOSIS — E1169 Type 2 diabetes mellitus with other specified complication: Secondary | ICD-10-CM | POA: Diagnosis not present

## 2019-11-25 DIAGNOSIS — Z86000 Personal history of in-situ neoplasm of breast: Secondary | ICD-10-CM | POA: Diagnosis not present

## 2019-11-25 DIAGNOSIS — D0511 Intraductal carcinoma in situ of right breast: Secondary | ICD-10-CM | POA: Diagnosis not present

## 2019-11-25 DIAGNOSIS — M8589 Other specified disorders of bone density and structure, multiple sites: Secondary | ICD-10-CM | POA: Diagnosis not present

## 2019-11-27 DIAGNOSIS — I129 Hypertensive chronic kidney disease with stage 1 through stage 4 chronic kidney disease, or unspecified chronic kidney disease: Secondary | ICD-10-CM | POA: Diagnosis not present

## 2019-11-28 DIAGNOSIS — I129 Hypertensive chronic kidney disease with stage 1 through stage 4 chronic kidney disease, or unspecified chronic kidney disease: Secondary | ICD-10-CM | POA: Diagnosis not present

## 2019-11-28 DIAGNOSIS — E1169 Type 2 diabetes mellitus with other specified complication: Secondary | ICD-10-CM | POA: Diagnosis not present

## 2019-11-28 DIAGNOSIS — E039 Hypothyroidism, unspecified: Secondary | ICD-10-CM | POA: Diagnosis not present

## 2019-11-28 DIAGNOSIS — E782 Mixed hyperlipidemia: Secondary | ICD-10-CM | POA: Diagnosis not present

## 2019-12-28 DIAGNOSIS — I129 Hypertensive chronic kidney disease with stage 1 through stage 4 chronic kidney disease, or unspecified chronic kidney disease: Secondary | ICD-10-CM | POA: Diagnosis not present

## 2019-12-29 DIAGNOSIS — E1169 Type 2 diabetes mellitus with other specified complication: Secondary | ICD-10-CM | POA: Diagnosis not present

## 2019-12-29 DIAGNOSIS — E782 Mixed hyperlipidemia: Secondary | ICD-10-CM | POA: Diagnosis not present

## 2019-12-29 DIAGNOSIS — E039 Hypothyroidism, unspecified: Secondary | ICD-10-CM | POA: Diagnosis not present

## 2019-12-29 DIAGNOSIS — I129 Hypertensive chronic kidney disease with stage 1 through stage 4 chronic kidney disease, or unspecified chronic kidney disease: Secondary | ICD-10-CM | POA: Diagnosis not present

## 2020-01-20 ENCOUNTER — Ambulatory Visit: Payer: Medicare HMO | Admitting: Sports Medicine

## 2020-01-20 ENCOUNTER — Other Ambulatory Visit: Payer: Self-pay

## 2020-01-20 ENCOUNTER — Encounter: Payer: Self-pay | Admitting: Sports Medicine

## 2020-01-20 DIAGNOSIS — E1142 Type 2 diabetes mellitus with diabetic polyneuropathy: Secondary | ICD-10-CM

## 2020-01-20 DIAGNOSIS — M79674 Pain in right toe(s): Secondary | ICD-10-CM

## 2020-01-20 DIAGNOSIS — M79675 Pain in left toe(s): Secondary | ICD-10-CM

## 2020-01-20 DIAGNOSIS — B351 Tinea unguium: Secondary | ICD-10-CM

## 2020-01-20 NOTE — Progress Notes (Signed)
Subjective: [name] [name] is a 79 y.o. female patient with history of diabetes who presents to office today complaining of long,mildly painful nails  while ambulating in shoes; unable to trim. Patient states that the glucose reading this morning was not checked and last a1c 6.5, last PCP visit with Dr. Nyra Capes was Feb. 8th. No other issues noted.  There are no problems to display for this patient.  Current Outpatient Medications on File Prior to Visit  Medication Sig Dispense Refill  . levothyroxine (SYNTHROID) 88 MCG tablet     . lisinopril-hydrochlorothiazide (PRINZIDE,ZESTORETIC) 20-12.5 MG per tablet Take 1 tablet by mouth. (once or 2 times daily??)     . oxyCODONE (OXY IR/ROXICODONE) 5 MG immediate release tablet TAKE 1 TABLET BY MOUTH EVERY 4 HOURS AS NEEDED FOR MODERATE TO SEVERE PAIN    . pravastatin (PRAVACHOL) 40 MG tablet Take 40 mg by mouth daily.     No current facility-administered medications on file prior to visit.   Allergies  Allergen Reactions  . Penicillins     No results found for this or any previous visit (from the past 2160 hour(s)).  Objective: General: Patient is awake, alert, and oriented x 3 and in no acute distress.  Integument: Skin is warm, dry and supple bilateral. Nails are tender, long, thickened and  dystrophic with subungual debris, consistent with onychomycosis, 1-5 bilateral. No signs of infection.  Minimal callus right sub-met 1 and left medial first toe.  No open lesions or preulcerative lesions present bilateral. Remaining integument unremarkable.  Vasculature:  Dorsalis Pedis pulse 2/4 bilateral. Posterior Tibial pulse  1/4 bilateral. Capillary fill time <3 sec 1-5 bilateral. Positive hair growth to the level of the digits.Temperature gradient within normal limits.  Mild varicosities and venous pigmentation present bilateral.  Trace edema present bilateral.   Neurology: The patient has diminished sensation measured with a 5.07/10g  Semmes Weinstein Monofilament at all pedal sites bilateral.  Musculoskeletal: Asymptomatic hammertoe and pes planus pedal deformities noted bilateral.  Assessment and Plan: Problem List Items Addressed This Visit    None    Visit Diagnoses    Pain due to onychomycosis of toenails of both feet    -  Primary   Diabetic polyneuropathy associated with type 2 diabetes mellitus ([place])         -Examined patient. -Re-Discussed and educated patient on diabetic foot care -Mechanically debrided all nails 1-5 bilateral using sterile nail nipper and filed with dremel without incident  -Recommend foot miracle cream -Patient to return in 3 months for at risk foot care -Patient advised to call the office if any problems or questions arise in the meantime.  Landis Martins, DPM

## 2020-01-27 DIAGNOSIS — I129 Hypertensive chronic kidney disease with stage 1 through stage 4 chronic kidney disease, or unspecified chronic kidney disease: Secondary | ICD-10-CM | POA: Diagnosis not present

## 2020-01-28 DIAGNOSIS — E1169 Type 2 diabetes mellitus with other specified complication: Secondary | ICD-10-CM | POA: Diagnosis not present

## 2020-01-28 DIAGNOSIS — E039 Hypothyroidism, unspecified: Secondary | ICD-10-CM | POA: Diagnosis not present

## 2020-01-28 DIAGNOSIS — I129 Hypertensive chronic kidney disease with stage 1 through stage 4 chronic kidney disease, or unspecified chronic kidney disease: Secondary | ICD-10-CM | POA: Diagnosis not present

## 2020-01-28 DIAGNOSIS — E782 Mixed hyperlipidemia: Secondary | ICD-10-CM | POA: Diagnosis not present

## 2020-02-16 DIAGNOSIS — Z1339 Encounter for screening examination for other mental health and behavioral disorders: Secondary | ICD-10-CM | POA: Diagnosis not present

## 2020-02-16 DIAGNOSIS — Z1331 Encounter for screening for depression: Secondary | ICD-10-CM | POA: Diagnosis not present

## 2020-02-16 DIAGNOSIS — Z139 Encounter for screening, unspecified: Secondary | ICD-10-CM | POA: Diagnosis not present

## 2020-02-16 DIAGNOSIS — Z136 Encounter for screening for cardiovascular disorders: Secondary | ICD-10-CM | POA: Diagnosis not present

## 2020-02-16 DIAGNOSIS — E1169 Type 2 diabetes mellitus with other specified complication: Secondary | ICD-10-CM | POA: Diagnosis not present

## 2020-02-16 DIAGNOSIS — Z0000 Encounter for general adult medical examination without abnormal findings: Secondary | ICD-10-CM | POA: Diagnosis not present

## 2020-02-16 DIAGNOSIS — Z7189 Other specified counseling: Secondary | ICD-10-CM | POA: Diagnosis not present

## 2020-02-24 DIAGNOSIS — I129 Hypertensive chronic kidney disease with stage 1 through stage 4 chronic kidney disease, or unspecified chronic kidney disease: Secondary | ICD-10-CM | POA: Diagnosis not present

## 2020-02-24 DIAGNOSIS — G252 Other specified forms of tremor: Secondary | ICD-10-CM | POA: Diagnosis not present

## 2020-02-24 DIAGNOSIS — E1169 Type 2 diabetes mellitus with other specified complication: Secondary | ICD-10-CM | POA: Diagnosis not present

## 2020-02-24 DIAGNOSIS — R32 Unspecified urinary incontinence: Secondary | ICD-10-CM | POA: Diagnosis not present

## 2020-02-24 DIAGNOSIS — E782 Mixed hyperlipidemia: Secondary | ICD-10-CM | POA: Diagnosis not present

## 2020-02-25 DIAGNOSIS — I129 Hypertensive chronic kidney disease with stage 1 through stage 4 chronic kidney disease, or unspecified chronic kidney disease: Secondary | ICD-10-CM | POA: Diagnosis not present

## 2020-02-28 DIAGNOSIS — I129 Hypertensive chronic kidney disease with stage 1 through stage 4 chronic kidney disease, or unspecified chronic kidney disease: Secondary | ICD-10-CM | POA: Diagnosis not present

## 2020-02-28 DIAGNOSIS — N183 Chronic kidney disease, stage 3 unspecified: Secondary | ICD-10-CM | POA: Diagnosis not present

## 2020-02-28 DIAGNOSIS — E782 Mixed hyperlipidemia: Secondary | ICD-10-CM | POA: Diagnosis not present

## 2020-02-28 DIAGNOSIS — E1169 Type 2 diabetes mellitus with other specified complication: Secondary | ICD-10-CM | POA: Diagnosis not present

## 2020-03-09 DIAGNOSIS — Z6838 Body mass index (BMI) 38.0-38.9, adult: Secondary | ICD-10-CM | POA: Diagnosis not present

## 2020-03-23 DIAGNOSIS — Z6838 Body mass index (BMI) 38.0-38.9, adult: Secondary | ICD-10-CM | POA: Diagnosis not present

## 2020-03-28 DIAGNOSIS — I129 Hypertensive chronic kidney disease with stage 1 through stage 4 chronic kidney disease, or unspecified chronic kidney disease: Secondary | ICD-10-CM | POA: Diagnosis not present

## 2020-03-29 DIAGNOSIS — Z6838 Body mass index (BMI) 38.0-38.9, adult: Secondary | ICD-10-CM | POA: Diagnosis not present

## 2020-03-29 DIAGNOSIS — I129 Hypertensive chronic kidney disease with stage 1 through stage 4 chronic kidney disease, or unspecified chronic kidney disease: Secondary | ICD-10-CM | POA: Diagnosis not present

## 2020-03-29 DIAGNOSIS — E1169 Type 2 diabetes mellitus with other specified complication: Secondary | ICD-10-CM | POA: Diagnosis not present

## 2020-03-30 ENCOUNTER — Other Ambulatory Visit: Payer: Self-pay

## 2020-03-30 ENCOUNTER — Ambulatory Visit: Payer: Medicare HMO | Admitting: Sports Medicine

## 2020-03-30 ENCOUNTER — Encounter: Payer: Self-pay | Admitting: Sports Medicine

## 2020-03-30 DIAGNOSIS — M79675 Pain in left toe(s): Secondary | ICD-10-CM | POA: Diagnosis not present

## 2020-03-30 DIAGNOSIS — E1142 Type 2 diabetes mellitus with diabetic polyneuropathy: Secondary | ICD-10-CM | POA: Diagnosis not present

## 2020-03-30 DIAGNOSIS — M79674 Pain in right toe(s): Secondary | ICD-10-CM

## 2020-03-30 DIAGNOSIS — B351 Tinea unguium: Secondary | ICD-10-CM

## 2020-03-30 NOTE — Progress Notes (Signed)
Subjective: [name] [name] is a 79 y.o. female patient with history of diabetes who presents to office today complaining of long,mildly painful nails  while ambulating in shoes; unable to trim. Patient states that the glucose reading this morning was 124 and last a1c 6.6, last PCP visit with Dr. Nyra Capes x 1 week ago. No other issues noted.  There are no problems to display for this patient.  Current Outpatient Medications on File Prior to Visit  Medication Sig Dispense Refill   Blood Glucose Monitoring Suppl (ACCU-CHEK GUIDE ME) w/Device KIT      levothyroxine (SYNTHROID) 88 MCG tablet      lisinopril-hydrochlorothiazide (PRINZIDE,ZESTORETIC) 20-12.5 MG per tablet Take 1 tablet by mouth. (once or 2 times daily??)      oxyCODONE (OXY IR/ROXICODONE) 5 MG immediate release tablet TAKE 1 TABLET BY MOUTH EVERY 4 HOURS AS NEEDED FOR MODERATE TO SEVERE PAIN     pravastatin (PRAVACHOL) 40 MG tablet Take 40 mg by mouth daily.     No current facility-administered medications on file prior to visit.   Allergies  Allergen Reactions   Penicillins     No results found for this or any previous visit (from the past 2160 hour(s)).  Objective: General: Patient is awake, alert, and oriented x 3 and in no acute distress.  Integument: Skin is warm, dry and supple bilateral. Nails are tender, long, thickened and dystrophic with subungual debris, consistent with onychomycosis, 1-5 bilateral. No signs of infection.  Resolved callus right sub-met 1 and left medial first toe.  No open lesions or preulcerative lesions present bilateral. Remaining integument unremarkable.  Vasculature:  Dorsalis Pedis pulse 2/4 bilateral. Posterior Tibial pulse  1/4 bilateral. Capillary fill time <3 sec 1-5 bilateral. Positive hair growth to the level of the digits.Temperature gradient within normal limits.  Mild varicosities and venous pigmentation present bilateral.  Trace edema present bilateral.   Neurology:  The patient has diminished sensation measured with a 5.07/10g Semmes Weinstein Monofilament at all pedal sites bilateral.  Musculoskeletal: Asymptomatic hammertoe and pes planus pedal deformities noted bilateral.  Assessment and Plan: Problem List Items Addressed This Visit    None    Visit Diagnoses    Pain due to onychomycosis of toenails of both feet    -  Primary   Diabetic polyneuropathy associated with type 2 diabetes mellitus ([place])         -Examined patient. -Re-Discussed and educated patient on diabetic foot care -Mechanically debrided all nails 1-5 bilateral using sterile nail nipper and filed with dremel without incident  -Continue with daily skin emollients -Patient to be scheduled for Diabetic shoes measurements  -Patient to return in 3 months for at risk foot care -Patient advised to call the office if any problems or questions arise in the meantime.  Landis Martins, DPM

## 2020-04-30 DIAGNOSIS — E1169 Type 2 diabetes mellitus with other specified complication: Secondary | ICD-10-CM | POA: Diagnosis not present

## 2020-04-30 DIAGNOSIS — E782 Mixed hyperlipidemia: Secondary | ICD-10-CM | POA: Diagnosis not present

## 2020-04-30 DIAGNOSIS — I129 Hypertensive chronic kidney disease with stage 1 through stage 4 chronic kidney disease, or unspecified chronic kidney disease: Secondary | ICD-10-CM | POA: Diagnosis not present

## 2020-05-02 ENCOUNTER — Other Ambulatory Visit: Payer: Medicare HMO

## 2020-05-11 DIAGNOSIS — D229 Melanocytic nevi, unspecified: Secondary | ICD-10-CM | POA: Diagnosis not present

## 2020-05-11 DIAGNOSIS — Z6839 Body mass index (BMI) 39.0-39.9, adult: Secondary | ICD-10-CM | POA: Diagnosis not present

## 2020-05-11 DIAGNOSIS — M7989 Other specified soft tissue disorders: Secondary | ICD-10-CM | POA: Diagnosis not present

## 2020-05-30 DIAGNOSIS — E782 Mixed hyperlipidemia: Secondary | ICD-10-CM | POA: Diagnosis not present

## 2020-05-30 DIAGNOSIS — I129 Hypertensive chronic kidney disease with stage 1 through stage 4 chronic kidney disease, or unspecified chronic kidney disease: Secondary | ICD-10-CM | POA: Diagnosis not present

## 2020-05-30 DIAGNOSIS — E1169 Type 2 diabetes mellitus with other specified complication: Secondary | ICD-10-CM | POA: Diagnosis not present

## 2020-05-31 DIAGNOSIS — E1169 Type 2 diabetes mellitus with other specified complication: Secondary | ICD-10-CM | POA: Diagnosis not present

## 2020-05-31 DIAGNOSIS — I129 Hypertensive chronic kidney disease with stage 1 through stage 4 chronic kidney disease, or unspecified chronic kidney disease: Secondary | ICD-10-CM | POA: Diagnosis not present

## 2020-05-31 DIAGNOSIS — E782 Mixed hyperlipidemia: Secondary | ICD-10-CM | POA: Diagnosis not present

## 2020-06-08 DIAGNOSIS — E1169 Type 2 diabetes mellitus with other specified complication: Secondary | ICD-10-CM | POA: Diagnosis not present

## 2020-06-09 ENCOUNTER — Other Ambulatory Visit: Payer: Self-pay

## 2020-06-09 ENCOUNTER — Encounter: Payer: Self-pay | Admitting: Sports Medicine

## 2020-06-09 ENCOUNTER — Ambulatory Visit: Payer: Medicare HMO | Admitting: Sports Medicine

## 2020-06-09 DIAGNOSIS — Z981 Arthrodesis status: Secondary | ICD-10-CM | POA: Diagnosis not present

## 2020-06-09 DIAGNOSIS — B351 Tinea unguium: Secondary | ICD-10-CM | POA: Diagnosis not present

## 2020-06-09 DIAGNOSIS — Z853 Personal history of malignant neoplasm of breast: Secondary | ICD-10-CM | POA: Diagnosis not present

## 2020-06-09 DIAGNOSIS — E1142 Type 2 diabetes mellitus with diabetic polyneuropathy: Secondary | ICD-10-CM | POA: Diagnosis not present

## 2020-06-09 DIAGNOSIS — R1900 Intra-abdominal and pelvic swelling, mass and lump, unspecified site: Secondary | ICD-10-CM | POA: Diagnosis not present

## 2020-06-09 DIAGNOSIS — Z96641 Presence of right artificial hip joint: Secondary | ICD-10-CM | POA: Diagnosis not present

## 2020-06-09 DIAGNOSIS — M7989 Other specified soft tissue disorders: Secondary | ICD-10-CM | POA: Diagnosis not present

## 2020-06-09 DIAGNOSIS — M79675 Pain in left toe(s): Secondary | ICD-10-CM

## 2020-06-09 DIAGNOSIS — M5136 Other intervertebral disc degeneration, lumbar region: Secondary | ICD-10-CM | POA: Diagnosis not present

## 2020-06-09 DIAGNOSIS — K5730 Diverticulosis of large intestine without perforation or abscess without bleeding: Secondary | ICD-10-CM | POA: Diagnosis not present

## 2020-06-09 DIAGNOSIS — M79674 Pain in right toe(s): Secondary | ICD-10-CM

## 2020-06-09 NOTE — Progress Notes (Signed)
Subjective: [name] [name] is a 79 y.o. female patient with history of diabetes who returns to office today complaining of long,mildly painful nails  while ambulating in shoes; unable to trim. Patient states that the glucose reading this morning was good went for a MRI and feels tired. No other issues noted.  There are no problems to display for this patient.  Current Outpatient Medications on File Prior to Visit  Medication Sig Dispense Refill  . Blood Glucose Monitoring Suppl (ACCU-CHEK GUIDE ME) w/Device KIT     . levothyroxine (SYNTHROID) 88 MCG tablet     . lisinopril-hydrochlorothiazide (PRINZIDE,ZESTORETIC) 20-12.5 MG per tablet Take 1 tablet by mouth. (once or 2 times daily??)     . oxyCODONE (OXY IR/ROXICODONE) 5 MG immediate release tablet TAKE 1 TABLET BY MOUTH EVERY 4 HOURS AS NEEDED FOR MODERATE TO SEVERE PAIN    . pravastatin (PRAVACHOL) 40 MG tablet Take 40 mg by mouth daily.     No current facility-administered medications on file prior to visit.   Allergies  Allergen Reactions  . Penicillins     No results found for this or any previous visit (from the past 2160 hour(s)).  Objective: General: Patient is awake, alert, and oriented x 3 and in no acute distress.  Integument: Skin is warm, dry and supple bilateral. Nails are tender, long, thickened and dystrophic with subungual debris, consistent with onychomycosis, 1-5 bilateral. No signs of infection.  No open lesions or preulcerative lesions present bilateral. Remaining integument unremarkable.  Vasculature:  Dorsalis Pedis pulse 2/4 bilateral. Posterior Tibial pulse  1/4 bilateral. Capillary fill time <3 sec 1-5 bilateral. Positive hair growth to the level of the digits.Temperature gradient within normal limits.  Mild varicosities and venous pigmentation present bilateral.  Trace edema present bilateral.   Neurology: The patient has diminished sensation measured with a 5.07/10g Semmes Weinstein Monofilament  at all pedal sites bilateral.  Musculoskeletal: Asymptomatic hammertoe and pes planus pedal deformities noted bilateral.  Assessment and Plan: Problem List Items Addressed This Visit    None    Visit Diagnoses    Pain due to onychomycosis of toenails of both feet    -  Primary   Diabetic polyneuropathy associated with type 2 diabetes mellitus ([place])         -Examined patient. -Re-Discussed and educated patient on diabetic foot care -Mechanically debrided all nails 1-5 bilateral using sterile nail nipper and filed with dremel without incident  -Patient to be re-scheduled for Diabetic shoes measurements  -Patient to return in 3 months for at risk foot care -Patient advised to call the office if any problems or questions arise in the meantime.  Landis Martins, DPM

## 2020-06-15 DIAGNOSIS — E1142 Type 2 diabetes mellitus with diabetic polyneuropathy: Secondary | ICD-10-CM | POA: Diagnosis not present

## 2020-06-15 DIAGNOSIS — Z23 Encounter for immunization: Secondary | ICD-10-CM | POA: Diagnosis not present

## 2020-06-15 DIAGNOSIS — E782 Mixed hyperlipidemia: Secondary | ICD-10-CM | POA: Diagnosis not present

## 2020-06-15 DIAGNOSIS — N1831 Chronic kidney disease, stage 3a: Secondary | ICD-10-CM | POA: Diagnosis not present

## 2020-06-15 DIAGNOSIS — E1169 Type 2 diabetes mellitus with other specified complication: Secondary | ICD-10-CM | POA: Diagnosis not present

## 2020-06-16 DIAGNOSIS — D171 Benign lipomatous neoplasm of skin and subcutaneous tissue of trunk: Secondary | ICD-10-CM | POA: Diagnosis not present

## 2020-06-19 DIAGNOSIS — Z1159 Encounter for screening for other viral diseases: Secondary | ICD-10-CM | POA: Diagnosis not present

## 2020-06-19 DIAGNOSIS — Z1152 Encounter for screening for COVID-19: Secondary | ICD-10-CM | POA: Diagnosis not present

## 2020-06-23 ENCOUNTER — Ambulatory Visit: Payer: Medicare HMO | Admitting: Orthotics

## 2020-06-23 ENCOUNTER — Other Ambulatory Visit: Payer: Self-pay

## 2020-06-23 DIAGNOSIS — E1142 Type 2 diabetes mellitus with diabetic polyneuropathy: Secondary | ICD-10-CM

## 2020-06-23 DIAGNOSIS — M79674 Pain in right toe(s): Secondary | ICD-10-CM

## 2020-06-23 DIAGNOSIS — L84 Corns and callosities: Secondary | ICD-10-CM

## 2020-06-23 NOTE — Progress Notes (Signed)

## 2020-06-26 DIAGNOSIS — D235 Other benign neoplasm of skin of trunk: Secondary | ICD-10-CM | POA: Diagnosis not present

## 2020-06-26 DIAGNOSIS — R222 Localized swelling, mass and lump, trunk: Secondary | ICD-10-CM | POA: Diagnosis not present

## 2020-06-26 DIAGNOSIS — D429 Neoplasm of uncertain behavior of meninges, unspecified: Secondary | ICD-10-CM | POA: Diagnosis not present

## 2020-06-26 DIAGNOSIS — I10 Essential (primary) hypertension: Secondary | ICD-10-CM | POA: Diagnosis not present

## 2020-06-26 DIAGNOSIS — Z7982 Long term (current) use of aspirin: Secondary | ICD-10-CM | POA: Diagnosis not present

## 2020-06-26 DIAGNOSIS — D171 Benign lipomatous neoplasm of skin and subcutaneous tissue of trunk: Secondary | ICD-10-CM | POA: Diagnosis not present

## 2020-06-26 DIAGNOSIS — K589 Irritable bowel syndrome without diarrhea: Secondary | ICD-10-CM | POA: Diagnosis not present

## 2020-06-26 DIAGNOSIS — E7800 Pure hypercholesterolemia, unspecified: Secondary | ICD-10-CM | POA: Diagnosis not present

## 2020-06-26 DIAGNOSIS — E119 Type 2 diabetes mellitus without complications: Secondary | ICD-10-CM | POA: Diagnosis not present

## 2020-06-26 DIAGNOSIS — D2262 Melanocytic nevi of left upper limb, including shoulder: Secondary | ICD-10-CM | POA: Diagnosis not present

## 2020-06-26 DIAGNOSIS — D1739 Benign lipomatous neoplasm of skin and subcutaneous tissue of other sites: Secondary | ICD-10-CM | POA: Diagnosis not present

## 2020-06-30 DIAGNOSIS — N1831 Chronic kidney disease, stage 3a: Secondary | ICD-10-CM | POA: Diagnosis not present

## 2020-06-30 DIAGNOSIS — E1169 Type 2 diabetes mellitus with other specified complication: Secondary | ICD-10-CM | POA: Diagnosis not present

## 2020-06-30 DIAGNOSIS — T8149XA Infection following a procedure, other surgical site, initial encounter: Secondary | ICD-10-CM | POA: Diagnosis not present

## 2020-06-30 DIAGNOSIS — E782 Mixed hyperlipidemia: Secondary | ICD-10-CM | POA: Diagnosis not present

## 2020-06-30 DIAGNOSIS — E1142 Type 2 diabetes mellitus with diabetic polyneuropathy: Secondary | ICD-10-CM | POA: Diagnosis not present

## 2020-06-30 DIAGNOSIS — Z09 Encounter for follow-up examination after completed treatment for conditions other than malignant neoplasm: Secondary | ICD-10-CM | POA: Diagnosis not present

## 2020-07-07 DIAGNOSIS — Z09 Encounter for follow-up examination after completed treatment for conditions other than malignant neoplasm: Secondary | ICD-10-CM | POA: Diagnosis not present

## 2020-07-19 DIAGNOSIS — D229 Melanocytic nevi, unspecified: Secondary | ICD-10-CM | POA: Diagnosis not present

## 2020-07-19 DIAGNOSIS — D2262 Melanocytic nevi of left upper limb, including shoulder: Secondary | ICD-10-CM | POA: Diagnosis not present

## 2020-07-20 DIAGNOSIS — D2262 Melanocytic nevi of left upper limb, including shoulder: Secondary | ICD-10-CM | POA: Diagnosis not present

## 2020-07-31 DIAGNOSIS — N1831 Chronic kidney disease, stage 3a: Secondary | ICD-10-CM | POA: Diagnosis not present

## 2020-07-31 DIAGNOSIS — E782 Mixed hyperlipidemia: Secondary | ICD-10-CM | POA: Diagnosis not present

## 2020-07-31 DIAGNOSIS — E1169 Type 2 diabetes mellitus with other specified complication: Secondary | ICD-10-CM | POA: Diagnosis not present

## 2020-07-31 DIAGNOSIS — E1142 Type 2 diabetes mellitus with diabetic polyneuropathy: Secondary | ICD-10-CM | POA: Diagnosis not present

## 2020-08-09 DIAGNOSIS — Z09 Encounter for follow-up examination after completed treatment for conditions other than malignant neoplasm: Secondary | ICD-10-CM | POA: Diagnosis not present

## 2020-08-18 ENCOUNTER — Encounter: Payer: Self-pay | Admitting: Sports Medicine

## 2020-08-18 ENCOUNTER — Other Ambulatory Visit: Payer: Self-pay

## 2020-08-18 ENCOUNTER — Ambulatory Visit: Payer: Medicare HMO | Admitting: Sports Medicine

## 2020-08-18 DIAGNOSIS — B351 Tinea unguium: Secondary | ICD-10-CM | POA: Diagnosis not present

## 2020-08-18 DIAGNOSIS — M79674 Pain in right toe(s): Secondary | ICD-10-CM

## 2020-08-18 DIAGNOSIS — M79675 Pain in left toe(s): Secondary | ICD-10-CM

## 2020-08-18 DIAGNOSIS — L84 Corns and callosities: Secondary | ICD-10-CM

## 2020-08-18 DIAGNOSIS — E1142 Type 2 diabetes mellitus with diabetic polyneuropathy: Secondary | ICD-10-CM

## 2020-08-18 NOTE — Progress Notes (Signed)
Subjective: [name] [name] is a 79 y.o. female patient with history of diabetes who returns to office today complaining of long,mildly painful nails  while ambulating in shoes; unable to trim. Reports that sometimes her right 2nd and 3rd toes turn dark in evening. Wants to know when her shoes are coming in? No other issues noted.  There are no problems to display for this patient.  Current Outpatient Medications on File Prior to Visit  Medication Sig Dispense Refill  . Blood Glucose Monitoring Suppl (ACCU-CHEK GUIDE ME) w/Device KIT     . levothyroxine (SYNTHROID) 88 MCG tablet     . lisinopril-hydrochlorothiazide (PRINZIDE,ZESTORETIC) 20-12.5 MG per tablet Take 1 tablet by mouth. (once or 2 times daily??)     . oxyCODONE (OXY IR/ROXICODONE) 5 MG immediate release tablet TAKE 1 TABLET BY MOUTH EVERY 4 HOURS AS NEEDED FOR MODERATE TO SEVERE PAIN    . pravastatin (PRAVACHOL) 40 MG tablet Take 40 mg by mouth daily.     No current facility-administered medications on file prior to visit.   Allergies  Allergen Reactions  . Penicillins     No results found for this or any previous visit (from the past 2160 hour(s)).  Objective: General: Patient is awake, alert, and oriented x 3 and in no acute distress.  Integument: Skin is warm, dry and supple bilateral. Nails are tender, long, thickened and dystrophic with subungual debris, consistent with onychomycosis, 1-5 bilateral. No signs of infection.  No open lesions or preulcerative lesions present bilateral. Remaining integument unremarkable.  Vasculature:  Dorsalis Pedis pulse 2/4 bilateral. Posterior Tibial pulse  1/4 bilateral. Capillary fill time <3 sec 1-5 bilateral. Positive hair growth to the level of the digits.Temperature gradient within normal limits.  Mild varicosities and venous pigmentation present bilateral.  Trace edema present bilateral worse today on right.   Neurology: The patient has diminished sensation measured  with a 5.07/10g Semmes Weinstein Monofilament at all pedal sites bilateral.  Musculoskeletal: Asymptomatic hammertoe and pes planus pedal deformities noted bilateral.  Assessment and Plan: Problem List Items Addressed This Visit    None    Visit Diagnoses    Pain due to onychomycosis of toenails of both feet    -  Primary   Foot callus       Diabetic polyneuropathy associated with type 2 diabetes mellitus ([place])         -Examined patient. -Re-Discussed and educated patient on diabetic foot care -Mechanically debrided all nails 1-5 bilateral using sterile nail nipper and filed with dremel without incident  -Awaiting Diabetic shoes -Encouraged elevation for edema control -Patient to return in 3 months for at risk foot care -Patient advised to call the office if any problems or questions arise in the meantime.  Landis Martins, DPM

## 2020-08-29 DIAGNOSIS — D171 Benign lipomatous neoplasm of skin and subcutaneous tissue of trunk: Secondary | ICD-10-CM | POA: Diagnosis not present

## 2020-08-30 DIAGNOSIS — E1142 Type 2 diabetes mellitus with diabetic polyneuropathy: Secondary | ICD-10-CM | POA: Diagnosis not present

## 2020-08-30 DIAGNOSIS — E782 Mixed hyperlipidemia: Secondary | ICD-10-CM | POA: Diagnosis not present

## 2020-08-30 DIAGNOSIS — N1831 Chronic kidney disease, stage 3a: Secondary | ICD-10-CM | POA: Diagnosis not present

## 2020-08-30 DIAGNOSIS — E1169 Type 2 diabetes mellitus with other specified complication: Secondary | ICD-10-CM | POA: Diagnosis not present

## 2020-09-07 DIAGNOSIS — E1169 Type 2 diabetes mellitus with other specified complication: Secondary | ICD-10-CM | POA: Diagnosis not present

## 2020-09-12 ENCOUNTER — Telehealth: Payer: Self-pay | Admitting: Sports Medicine

## 2020-09-12 NOTE — Telephone Encounter (Signed)
Pt called checking on status of diabetic shoes.   Upon looking we have not received documents from pts pcp. She is calling them to discuss.

## 2020-09-30 DIAGNOSIS — E1169 Type 2 diabetes mellitus with other specified complication: Secondary | ICD-10-CM | POA: Diagnosis not present

## 2020-09-30 DIAGNOSIS — N1831 Chronic kidney disease, stage 3a: Secondary | ICD-10-CM | POA: Diagnosis not present

## 2020-09-30 DIAGNOSIS — E782 Mixed hyperlipidemia: Secondary | ICD-10-CM | POA: Diagnosis not present

## 2020-09-30 DIAGNOSIS — E1142 Type 2 diabetes mellitus with diabetic polyneuropathy: Secondary | ICD-10-CM | POA: Diagnosis not present

## 2020-10-09 DIAGNOSIS — Z6841 Body Mass Index (BMI) 40.0 and over, adult: Secondary | ICD-10-CM | POA: Diagnosis not present

## 2020-10-09 DIAGNOSIS — R9431 Abnormal electrocardiogram [ECG] [EKG]: Secondary | ICD-10-CM | POA: Diagnosis not present

## 2020-10-09 DIAGNOSIS — R0602 Shortness of breath: Secondary | ICD-10-CM | POA: Diagnosis not present

## 2020-10-31 DIAGNOSIS — E782 Mixed hyperlipidemia: Secondary | ICD-10-CM | POA: Diagnosis not present

## 2020-10-31 DIAGNOSIS — N1831 Chronic kidney disease, stage 3a: Secondary | ICD-10-CM | POA: Diagnosis not present

## 2020-10-31 DIAGNOSIS — E1169 Type 2 diabetes mellitus with other specified complication: Secondary | ICD-10-CM | POA: Diagnosis not present

## 2020-10-31 DIAGNOSIS — E1142 Type 2 diabetes mellitus with diabetic polyneuropathy: Secondary | ICD-10-CM | POA: Diagnosis not present

## 2020-11-06 DIAGNOSIS — R0602 Shortness of breath: Secondary | ICD-10-CM | POA: Diagnosis not present

## 2020-11-06 DIAGNOSIS — I340 Nonrheumatic mitral (valve) insufficiency: Secondary | ICD-10-CM | POA: Diagnosis not present

## 2020-11-06 DIAGNOSIS — I342 Nonrheumatic mitral (valve) stenosis: Secondary | ICD-10-CM | POA: Diagnosis not present

## 2020-11-17 DIAGNOSIS — R0602 Shortness of breath: Secondary | ICD-10-CM | POA: Diagnosis not present

## 2020-11-17 DIAGNOSIS — Z23 Encounter for immunization: Secondary | ICD-10-CM | POA: Diagnosis not present

## 2020-11-17 DIAGNOSIS — Z6841 Body Mass Index (BMI) 40.0 and over, adult: Secondary | ICD-10-CM | POA: Diagnosis not present

## 2020-11-17 DIAGNOSIS — I700 Atherosclerosis of aorta: Secondary | ICD-10-CM | POA: Diagnosis not present

## 2020-11-21 ENCOUNTER — Encounter: Payer: Self-pay | Admitting: Sports Medicine

## 2020-11-21 ENCOUNTER — Ambulatory Visit: Payer: Medicare HMO | Admitting: Sports Medicine

## 2020-11-21 ENCOUNTER — Other Ambulatory Visit: Payer: Self-pay

## 2020-11-21 DIAGNOSIS — I739 Peripheral vascular disease, unspecified: Secondary | ICD-10-CM | POA: Diagnosis not present

## 2020-11-21 DIAGNOSIS — M79675 Pain in left toe(s): Secondary | ICD-10-CM | POA: Diagnosis not present

## 2020-11-21 DIAGNOSIS — M79674 Pain in right toe(s): Secondary | ICD-10-CM

## 2020-11-21 DIAGNOSIS — E1142 Type 2 diabetes mellitus with diabetic polyneuropathy: Secondary | ICD-10-CM

## 2020-11-21 DIAGNOSIS — B351 Tinea unguium: Secondary | ICD-10-CM

## 2020-11-21 DIAGNOSIS — L84 Corns and callosities: Secondary | ICD-10-CM

## 2020-11-21 NOTE — Progress Notes (Signed)
Subjective: [name] [name] is a 80 y.o. female patient with history of diabetes who returns to office today complaining of long,mildly painful nails  while ambulating in shoes; unable to trim. Reports that she has still been dealing with a lot of swelling and still hasn't heard about her shoes. No other issues noted.  There are no problems to display for this patient.  Current Outpatient Medications on File Prior to Visit  Medication Sig Dispense Refill  . albuterol (VENTOLIN HFA) 108 (90 Base) MCG/ACT inhaler     . Blood Glucose Monitoring Suppl (ACCU-CHEK GUIDE ME) w/Device KIT     . levothyroxine (SYNTHROID) 88 MCG tablet     . lisinopril-hydrochlorothiazide (PRINZIDE,ZESTORETIC) 20-12.5 MG per tablet Take 1 tablet by mouth. (once or 2 times daily??)     . oxyCODONE (OXY IR/ROXICODONE) 5 MG immediate release tablet TAKE 1 TABLET BY MOUTH EVERY 4 HOURS AS NEEDED FOR MODERATE TO SEVERE PAIN    . pravastatin (PRAVACHOL) 40 MG tablet Take 40 mg by mouth daily.     No current facility-administered medications on file prior to visit.   Allergies  Allergen Reactions  . Penicillins     No results found for this or any previous visit (from the past 2160 hour(s)).  Objective: General: Patient is awake, alert, and oriented x 3 and in no acute distress.  Integument: Skin is warm, dry and supple bilateral. Nails are tender, long, thickened and dystrophic with subungual debris, consistent with onychomycosis, 1-5 bilateral. No signs of infection.  No open lesions or preulcerative lesions present bilateral. Minimal callus to 1st toes bilateral and dry fissures. Remaining integument unremarkable.  Vasculature:  Dorsalis Pedis pulse 2/4 bilateral. Posterior Tibial pulse  1/4 bilateral. Capillary fill time <3 sec 1-5 bilateral. Positive hair growth to the level of the digits.Temperature gradient within normal limits.  Mild varicosities and venous pigmentation present bilateral.  +1 pitting  edema present bilateral.  Neurology: The patient has diminished sensation measured with a 5.07/10g Semmes Weinstein Monofilament at all pedal sites bilateral.  Musculoskeletal: Asymptomatic hammertoe and pes planus pedal deformities noted bilateral.  Assessment and Plan: Problem List Items Addressed This Visit   None   Visit Diagnoses    Pain due to onychomycosis of toenails of both feet    -  Primary   Foot callus       Diabetic polyneuropathy associated with type 2 diabetes mellitus ([place])       PVD (peripheral vascular disease) ([place])         -Examined patient. -Re-Discussed and educated patient on diabetic foot care -Mechanically debrided all nails 1-5 bilateral using sterile nail nipper and filed with dremel without incident  -Recommend vaseline to dry skin/fissures/mild callus to toes -Awaiting Diabetic shoes; PCP has to sign off on paper work -Encouraged elevation for edema control and to discuss w/ PCP or Cardiologist for med management -Patient to return in 3 months for at risk foot care -Patient advised to call the office if any problems or questions arise in the meantime.  Landis Martins, DPM

## 2020-11-24 ENCOUNTER — Telehealth: Payer: Self-pay | Admitting: Hematology and Oncology

## 2020-11-24 NOTE — Telephone Encounter (Signed)
11/24/20 Spoke with patient and scheduled appt after mammogram on 12/12/20.

## 2020-11-27 DIAGNOSIS — Z6841 Body Mass Index (BMI) 40.0 and over, adult: Secondary | ICD-10-CM | POA: Diagnosis not present

## 2020-11-27 DIAGNOSIS — E1142 Type 2 diabetes mellitus with diabetic polyneuropathy: Secondary | ICD-10-CM | POA: Diagnosis not present

## 2020-11-27 DIAGNOSIS — R0602 Shortness of breath: Secondary | ICD-10-CM | POA: Diagnosis not present

## 2020-11-27 DIAGNOSIS — R609 Edema, unspecified: Secondary | ICD-10-CM | POA: Diagnosis not present

## 2020-11-28 DIAGNOSIS — E782 Mixed hyperlipidemia: Secondary | ICD-10-CM | POA: Diagnosis not present

## 2020-11-28 DIAGNOSIS — E1142 Type 2 diabetes mellitus with diabetic polyneuropathy: Secondary | ICD-10-CM | POA: Diagnosis not present

## 2020-11-28 DIAGNOSIS — E1169 Type 2 diabetes mellitus with other specified complication: Secondary | ICD-10-CM | POA: Diagnosis not present

## 2020-11-28 DIAGNOSIS — N1831 Chronic kidney disease, stage 3a: Secondary | ICD-10-CM | POA: Diagnosis not present

## 2020-12-05 DIAGNOSIS — H43392 Other vitreous opacities, left eye: Secondary | ICD-10-CM | POA: Diagnosis not present

## 2020-12-05 DIAGNOSIS — Z961 Presence of intraocular lens: Secondary | ICD-10-CM | POA: Diagnosis not present

## 2020-12-05 DIAGNOSIS — E119 Type 2 diabetes mellitus without complications: Secondary | ICD-10-CM | POA: Diagnosis not present

## 2020-12-05 DIAGNOSIS — H43812 Vitreous degeneration, left eye: Secondary | ICD-10-CM | POA: Diagnosis not present

## 2020-12-05 DIAGNOSIS — H524 Presbyopia: Secondary | ICD-10-CM | POA: Diagnosis not present

## 2020-12-05 DIAGNOSIS — H18519 Endothelial corneal dystrophy, unspecified eye: Secondary | ICD-10-CM | POA: Diagnosis not present

## 2020-12-05 DIAGNOSIS — H16223 Keratoconjunctivitis sicca, not specified as Sjogren's, bilateral: Secondary | ICD-10-CM | POA: Diagnosis not present

## 2020-12-05 DIAGNOSIS — H353131 Nonexudative age-related macular degeneration, bilateral, early dry stage: Secondary | ICD-10-CM | POA: Diagnosis not present

## 2020-12-08 DIAGNOSIS — J45909 Unspecified asthma, uncomplicated: Secondary | ICD-10-CM | POA: Diagnosis not present

## 2020-12-08 DIAGNOSIS — R0602 Shortness of breath: Secondary | ICD-10-CM | POA: Diagnosis not present

## 2020-12-12 DIAGNOSIS — Z1231 Encounter for screening mammogram for malignant neoplasm of breast: Secondary | ICD-10-CM | POA: Diagnosis not present

## 2020-12-13 DIAGNOSIS — Z86010 Personal history of colonic polyps: Secondary | ICD-10-CM | POA: Diagnosis not present

## 2020-12-15 ENCOUNTER — Encounter: Payer: Self-pay | Admitting: Hematology and Oncology

## 2020-12-15 ENCOUNTER — Inpatient Hospital Stay: Payer: Medicare HMO | Attending: Hematology and Oncology | Admitting: Hematology and Oncology

## 2020-12-15 ENCOUNTER — Other Ambulatory Visit: Payer: Self-pay

## 2020-12-15 VITALS — BP 166/85 | HR 72 | Temp 98.9°F | Resp 18 | Ht 62.0 in | Wt 209.6 lb

## 2020-12-15 DIAGNOSIS — Z780 Asymptomatic menopausal state: Secondary | ICD-10-CM

## 2020-12-15 DIAGNOSIS — Z1231 Encounter for screening mammogram for malignant neoplasm of breast: Secondary | ICD-10-CM

## 2020-12-15 DIAGNOSIS — M8580 Other specified disorders of bone density and structure, unspecified site: Secondary | ICD-10-CM | POA: Diagnosis not present

## 2020-12-15 DIAGNOSIS — D0511 Intraductal carcinoma in situ of right breast: Secondary | ICD-10-CM

## 2020-12-15 NOTE — Progress Notes (Signed)
[facility]  [address] [place],  [place]  [address] [phone]  Clinic Day:  12/15/2020  Referring physician: Marco Collie, MD   CHIEF COMPLAINT:  CC:   Ductal carcinoma in situ of the breast  Current Treatment:   Observation    HISTORY OF PRESENT ILLNESS:  [name] [name] is a 80 y.o. female with a history of stage 0 (TIS N0 M0) hormone receptor positive right breast cancer diagnosed in July 2015. Pathology revealed a 3.5 cm, grade 2, ductal carcinoma in situ.  She was treated with right mastectomy.  She was placed on chemoprevention with tamoxifen 20 mg daily, but discontinued this on her own.  When we saw her in February 2016, we recommended that the patient resume the tamoxifen.  We also recommended follow-up in 3 months, however, the patient did not schedule follow up until January of 2017 due to other medical problems and financial reasons. She stated she was taking tamoxifen daily at that time and remained without evidence of disease.  Bone density scan in October 2018 revealed a 6.1% improvement in her osteopenia with a T-score of -1.7 in the femur.  Screening mammogram in January 2019 revealed a possible mass in the left breast.  Diagnostic mammogram revealed a focally dilated duct in the subareolar area accounting for the finding on screening mammogram.  This was confirmed on ultrasound.  There was no evidence of malignancy  Colonoscopy in June 2011 revealed multiple polyps which were removed, including 4 tubular adenomas and 2 tubulovillous adenomas.   She did not go for follow-up colonoscopy after that, so we referred her back to Dr. Lyda Jester. Colonoscopy in March 2017 revealeda tubular adenoma and so he recommended a repeat in 1 year.  Due to her personal and family history of malignancy and colon polyps, we recommended testing for hereditary cancer syndromes with the Myriad myRisk Hereditary Cancer Panel test.  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  Due to this finding, colonoscopy at least every 5 years is recommended.  The patient had repeat colonoscopy in January 2020 and had removal of tubular adenomas once again, so Dr. Lyda Jester recommended repeat colonoscopy in 1 year.  She had a left shoulder replacement in September 2020 due to pain and humerus fracture.  Bone density from October 2020 revealed worsening osteopenia with a T-score of -2.1 of the left femur neck, previously -1.7.  The left forearm radius was still within the normal range with a T-score of 0.8, previously 0.5.  She is taking calcium/vitamin D.  At her visit in February 2021, she had not yet undergone repeat colonoscopy as recommended by Dr. Lyda Jester.  She was scheduled but had to cancel her appointment.  We recommended she reschedule her colonoscopy at that time.  INTERVAL HISTORY:  [name] is here today for repeat examination.  She denies any changes in the left breast or right mastectomy site.  She states she is being evaluated for COPD and is on oxygen 2 L per nasal cannula. She began having increased shortness of breath after surgery in September 2021.  She had a large soft mass of the right buttock excised by Dr. Lilia Pro.  Pathology revealed a soft tissue lipoma.  She also had a punch biopsy of a left shoulder lesion which revealed a junctional dysplastic melanocytic nevus with moderate to severe atypia.  This lesion was excised in October.  Residual dysplastic  nevus was seen but the margins were free of involvement.  Echocardiogram in February revealed paradoxical septal motion, but otherwise was fairly normal.  She saw Dr. Lyda Jester this week and discuss the pros and cons of colonoscopy.  They decided to wait another year. She denies fevers or chills. She denies pain. Her appetite is good. Her weight has increased 13 pounds over last 1 year.  Prior to her visit today she  underwent a left screening mammogram.  REVIEW OF SYSTEMS:  Review of Systems  Constitutional: Negative for appetite change, chills, fatigue, fever and unexpected weight change.  HENT:   Negative for lump/mass, mouth sores and sore throat.   Respiratory: Negative for cough and shortness of breath.   Cardiovascular: Negative for chest pain and leg swelling.  Gastrointestinal: Negative for abdominal pain, constipation, diarrhea, nausea and vomiting.  Endocrine: Negative for hot flashes.  Genitourinary: Negative for difficulty urinating, dysuria, frequency and hematuria.   Musculoskeletal: Negative for arthralgias, back pain and myalgias.  Skin: Negative for rash.  Neurological: Negative for dizziness and headaches.  Hematological: Negative for adenopathy. Does not bruise/bleed easily.  Psychiatric/Behavioral: Negative for depression and sleep disturbance. The patient is not nervous/anxious.      VITALS:  There were no vitals taken for this visit.  Wt Readings from Last 3 Encounters:  03/12/18 205 lb (93 kg)    There is no height or weight on file to calculate BMI.  Performance status (ECOG): 1 - Symptomatic but completely ambulatory  PHYSICAL EXAM:  Physical Exam Vitals and nursing note reviewed.  Constitutional:      General: She is not in acute distress.    Appearance: Normal appearance.  HENT:     Head: Normocephalic and atraumatic.     Mouth/Throat:     Mouth: Mucous membranes are moist.     Pharynx: Oropharynx is clear. No oropharyngeal exudate or posterior oropharyngeal erythema.  Eyes:     General: No scleral icterus.    Extraocular Movements: Extraocular movements intact.     Conjunctiva/sclera: Conjunctivae normal.     Pupils: Pupils are equal, round, and reactive to light.  Cardiovascular:     Rate and Rhythm: Normal rate and regular rhythm.     Heart sounds: Normal heart sounds. No murmur heard. No friction rub. No gallop.   Pulmonary:     Effort: Pulmonary  effort is normal.     Breath sounds: Normal breath sounds. No rhonchi or rales.  Chest:  Breasts:     Right: Normal. No swelling, bleeding, inverted nipple, mass, nipple discharge, skin change, tenderness, axillary adenopathy or supraclavicular adenopathy.     Left: No axillary adenopathy or supraclavicular adenopathy.      Comments:  Left mastectomy site is negative. Abdominal:     General: There is no distension.     Palpations: Abdomen is soft. There is no hepatomegaly, splenomegaly or mass.     Tenderness: There is no abdominal tenderness.  Musculoskeletal:        General: Normal range of motion.     Cervical back: Normal range of motion and neck supple. No tenderness.     Right lower leg: No edema.     Left lower leg: No edema.  Lymphadenopathy:     Cervical: No cervical adenopathy.     Upper Body:     Right upper body: No supraclavicular or axillary adenopathy.     Left upper body: No supraclavicular or axillary adenopathy.     Lower Body: No right  inguinal adenopathy. No left inguinal adenopathy.  Skin:    General: Skin is warm and dry.     Coloration: Skin is not jaundiced.     Findings: No rash.  Neurological:     Mental Status: She is alert and oriented to person, place, and time.     Cranial Nerves: No cranial nerve deficit.  Psychiatric:        Mood and Affect: Mood normal.        Behavior: Behavior normal.        Thought Content: Thought content normal.    LABS:   CBC Latest Ref Rng & Units 08/02/2010 08/01/2010 07/23/2010  WBC 4.0 - 10.5 K/uL 9.6 7.4 5.9  Hemoglobin 12.0 - 15.0 g/dL 10.6(L) 11.8(L) 14.3  Hematocrit 36.0 - 46.0 % 31.3(L) 34.4(L) 42.4  Platelets 150 - 400 K/uL 152 165 231   CMP Latest Ref Rng & Units 08/02/2010 08/01/2010 07/23/2010  Glucose 70 - 99 mg/dL 131(H) 128(H) 140(H)  BUN 6 - 23 mg/dL 7 9 18   Creatinine 0.4 - 1.2 mg/dL 0.91 0.99 1.16  Sodium 135 - 145 mEq/L 135 136 141  Potassium 3.5 - 5.1 mEq/L 4.1 4.4 3.9  Chloride 96 - 112 mEq/L  100 102 105  CO2 19 - 32 mEq/L 30 30 28   Calcium 8.4 - 10.5 mg/dL 7.8(L) 7.9(L) 9.2     No results found for: CEA1 / No results found for: CEA1 No results found for: PSA1 No results found for: URK270 No results found for: CAN125  No results found for: TOTALPROTELP, ALBUMINELP, A1GS, A2GS, BETS, BETA2SER, GAMS, MSPIKE, SPEI No results found for: TIBC, FERRITIN, IRONPCTSAT No results found for: LDH  STUDIES:  No results found.  Exam(s): [id number] MAM/MAM DIGITAL TOMO SCREENING L CLINICAL DATA:  Screening.  EXAM: DIGITAL SCREENING UNILATERAL LEFT MAMMOGRAM WITH CAD AND TOMOSYNTHESIS  TECHNIQUE: Left screening digital craniocaudal and mediolateral oblique mammograms were obtained. Left screening digital breast tomosynthesis was performed. The images were evaluated with computer-aided detection.  COMPARISON:  Previous exam(s).  ACR Breast Density Category b: There are scattered areas of fibroglandular density.  FINDINGS: There are no findings suspicious for malignancy. The images were evaluated with computer-aided detection.  IMPRESSION: No mammographic evidence of malignancy. A result letter of this screening mammogram will be mailed directly to the patient.  RECOMMENDATION: Screening mammogram in one year. (Code:SM-B-01Y)  BI-RADS CATEGORY  1: Negative.   HISTORY:   Past Medical History:  Diagnosis Date  . Arthritis   . Asthma   . Hearing loss   . HLD (hyperlipidemia)   . HTN (hypertension)   . Muscle pain   . Obesity   . Sinus problem   . Swelling     Past Surgical History:  Procedure Laterality Date  . TONSILLECTOMY    . TOTAL KNEE ARTHROPLASTY     right    Family History  Problem Relation Age of Onset  . Alzheimer's disease Other     Social History:  reports that she has never smoked. She has never used smokeless tobacco. She reports that she does not drink alcohol and does not use drugs.The patient is alone today.  Allergies:  Allergies   Allergen Reactions  . Penicillins     Current Medications: Current Outpatient Medications  Medication Sig Dispense Refill  . albuterol (VENTOLIN HFA) 108 (90 Base) MCG/ACT inhaler     . Blood Glucose Monitoring Suppl (ACCU-CHEK GUIDE ME) w/Device KIT     . levothyroxine (SYNTHROID) 88 MCG tablet     .  lisinopril-hydrochlorothiazide (PRINZIDE,ZESTORETIC) 20-12.5 MG per tablet Take 1 tablet by mouth. (once or 2 times daily??)     . oxyCODONE (OXY IR/ROXICODONE) 5 MG immediate release tablet TAKE 1 TABLET BY MOUTH EVERY 4 HOURS AS NEEDED FOR MODERATE TO SEVERE PAIN    . pravastatin (PRAVACHOL) 40 MG tablet Take 40 mg by mouth daily.     No current facility-administered medications for this visit.     ASSESSMENT & PLAN:   Assessment: 1. History of hormone receptor positive ductal carcinoma in situ treated with mastectomy and chemoprevention with tamoxifen for 5 years.  The patient remains without evidence of recurrence. 2. 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  She has had multiple adenomatous polyps .  She saw Dr. Lyda Jester this week and decided to wait and see him again next year for possible colonoscopy. 3. Osteopenia, with mild worsening of the left femur on last bone density scan.  She will be due for bone density scan again in October 2022.    Plan:    I will schedule her for bone density scan in October.  We will plan to see her back in 1 year with left screening mammogram. The patient understands the plans discussed today and is in agreement with them.  She knows to contact our office if she develops concerns prior to her next appointment.     Marvia Pickles, PA-C

## 2020-12-15 NOTE — Progress Notes (Signed)
Has SOB and said that she is being tested for COPD and is currently on Oxygen at 2 liters

## 2020-12-29 DIAGNOSIS — E782 Mixed hyperlipidemia: Secondary | ICD-10-CM | POA: Diagnosis not present

## 2020-12-29 DIAGNOSIS — N1831 Chronic kidney disease, stage 3a: Secondary | ICD-10-CM | POA: Diagnosis not present

## 2020-12-29 DIAGNOSIS — E1142 Type 2 diabetes mellitus with diabetic polyneuropathy: Secondary | ICD-10-CM | POA: Diagnosis not present

## 2020-12-29 DIAGNOSIS — I129 Hypertensive chronic kidney disease with stage 1 through stage 4 chronic kidney disease, or unspecified chronic kidney disease: Secondary | ICD-10-CM | POA: Diagnosis not present

## 2021-01-04 DIAGNOSIS — U071 COVID-19: Secondary | ICD-10-CM | POA: Diagnosis not present

## 2021-01-04 DIAGNOSIS — Z20822 Contact with and (suspected) exposure to covid-19: Secondary | ICD-10-CM | POA: Diagnosis not present

## 2021-01-08 ENCOUNTER — Telehealth: Payer: Self-pay | Admitting: Sports Medicine

## 2021-01-08 DIAGNOSIS — J45909 Unspecified asthma, uncomplicated: Secondary | ICD-10-CM | POA: Diagnosis not present

## 2021-01-08 NOTE — Telephone Encounter (Signed)
Pt left message checking on diabetic shoes.  Upon checking I did not see any updated documents last was from 10.2021. Pt stated her daughter dropped them off at our front office.  I looked and found them but the first name had an extra letter in it and I called pt and told pt I found it and that we have the shoes and I have scheduled her to pick up the shoes at her appt in may in [place] office.Marland Kitchen

## 2021-01-11 DIAGNOSIS — E1169 Type 2 diabetes mellitus with other specified complication: Secondary | ICD-10-CM | POA: Diagnosis not present

## 2021-01-18 DIAGNOSIS — J45909 Unspecified asthma, uncomplicated: Secondary | ICD-10-CM | POA: Diagnosis not present

## 2021-01-18 DIAGNOSIS — Z1339 Encounter for screening examination for other mental health and behavioral disorders: Secondary | ICD-10-CM | POA: Diagnosis not present

## 2021-01-18 DIAGNOSIS — N1831 Chronic kidney disease, stage 3a: Secondary | ICD-10-CM | POA: Diagnosis not present

## 2021-01-18 DIAGNOSIS — E1169 Type 2 diabetes mellitus with other specified complication: Secondary | ICD-10-CM | POA: Diagnosis not present

## 2021-01-18 DIAGNOSIS — Z1331 Encounter for screening for depression: Secondary | ICD-10-CM | POA: Diagnosis not present

## 2021-01-18 DIAGNOSIS — Z136 Encounter for screening for cardiovascular disorders: Secondary | ICD-10-CM | POA: Diagnosis not present

## 2021-01-18 DIAGNOSIS — Z0000 Encounter for general adult medical examination without abnormal findings: Secondary | ICD-10-CM | POA: Diagnosis not present

## 2021-01-18 DIAGNOSIS — Z139 Encounter for screening, unspecified: Secondary | ICD-10-CM | POA: Diagnosis not present

## 2021-01-18 DIAGNOSIS — E782 Mixed hyperlipidemia: Secondary | ICD-10-CM | POA: Diagnosis not present

## 2021-01-18 DIAGNOSIS — E039 Hypothyroidism, unspecified: Secondary | ICD-10-CM | POA: Diagnosis not present

## 2021-01-28 DIAGNOSIS — E1169 Type 2 diabetes mellitus with other specified complication: Secondary | ICD-10-CM | POA: Diagnosis not present

## 2021-01-28 DIAGNOSIS — I129 Hypertensive chronic kidney disease with stage 1 through stage 4 chronic kidney disease, or unspecified chronic kidney disease: Secondary | ICD-10-CM | POA: Diagnosis not present

## 2021-02-07 DIAGNOSIS — J45909 Unspecified asthma, uncomplicated: Secondary | ICD-10-CM | POA: Diagnosis not present

## 2021-02-08 DIAGNOSIS — Z6841 Body Mass Index (BMI) 40.0 and over, adult: Secondary | ICD-10-CM | POA: Diagnosis not present

## 2021-02-08 DIAGNOSIS — Z20822 Contact with and (suspected) exposure to covid-19: Secondary | ICD-10-CM | POA: Diagnosis not present

## 2021-02-08 DIAGNOSIS — Z1152 Encounter for screening for COVID-19: Secondary | ICD-10-CM | POA: Diagnosis not present

## 2021-02-08 DIAGNOSIS — J449 Chronic obstructive pulmonary disease, unspecified: Secondary | ICD-10-CM | POA: Diagnosis not present

## 2021-02-20 ENCOUNTER — Encounter: Payer: Self-pay | Admitting: Sports Medicine

## 2021-02-20 ENCOUNTER — Ambulatory Visit: Payer: Medicare HMO | Admitting: Sports Medicine

## 2021-02-20 ENCOUNTER — Other Ambulatory Visit: Payer: Self-pay

## 2021-02-20 DIAGNOSIS — M79674 Pain in right toe(s): Secondary | ICD-10-CM

## 2021-02-20 DIAGNOSIS — B351 Tinea unguium: Secondary | ICD-10-CM

## 2021-02-20 DIAGNOSIS — L84 Corns and callosities: Secondary | ICD-10-CM

## 2021-02-20 DIAGNOSIS — E1142 Type 2 diabetes mellitus with diabetic polyneuropathy: Secondary | ICD-10-CM | POA: Diagnosis not present

## 2021-02-20 DIAGNOSIS — M2142 Flat foot [pes planus] (acquired), left foot: Secondary | ICD-10-CM | POA: Diagnosis not present

## 2021-02-20 DIAGNOSIS — M2141 Flat foot [pes planus] (acquired), right foot: Secondary | ICD-10-CM | POA: Diagnosis not present

## 2021-02-20 DIAGNOSIS — M2042 Other hammer toe(s) (acquired), left foot: Secondary | ICD-10-CM

## 2021-02-20 DIAGNOSIS — M2040 Other hammer toe(s) (acquired), unspecified foot: Secondary | ICD-10-CM

## 2021-02-20 DIAGNOSIS — I739 Peripheral vascular disease, unspecified: Secondary | ICD-10-CM

## 2021-02-20 DIAGNOSIS — M79675 Pain in left toe(s): Secondary | ICD-10-CM | POA: Diagnosis not present

## 2021-02-20 DIAGNOSIS — M2041 Other hammer toe(s) (acquired), right foot: Secondary | ICD-10-CM

## 2021-02-20 NOTE — Progress Notes (Signed)
Patient presents for diabetic shoe pick up, shoes are tried on for good fit.  Patient received 1 Pair and 3 pairs custom molded diabetic inserts.  Verbal and written break in and wear instructions given.  Patient will follow up for scheduled routine care.   

## 2021-02-20 NOTE — Progress Notes (Signed)
Subjective: [name] [name] is a 80 y.o. female patient with history of diabetes who returns to office today complaining of long,mildly painful nails while ambulating in shoes; unable to trim.  Patient is also here for pickup of diabetic shoes.  No other issues noted.  Fasting blood sugar patient does not check Last A1c 7 Last visit to PCP x 3 weeks ago  Patient Active Problem List   Diagnosis Date Noted  . Intraductal carcinoma in situ of right breast 04/27/2014   Current Outpatient Medications on File Prior to Visit  Medication Sig Dispense Refill  . albuterol (VENTOLIN HFA) 108 (90 Base) MCG/ACT inhaler     . aspirin EC 81 MG tablet Take 81 mg by mouth daily. Swallow whole.    . Blood Glucose Monitoring Suppl (ACCU-CHEK GUIDE ME) w/Device KIT     . diphenhydrAMINE (BENADRYL) 25 MG tablet Take 25 mg by mouth at bedtime as needed for sleep.    Marland Kitchen Fluticasone-Umeclidin-Vilant (TRELEGY ELLIPTA) 200-62.5-25 MCG/INH AEPB Inhale into the lungs.    . furosemide (LASIX) 20 MG tablet Take 20 mg by mouth.    . gabapentin (NEURONTIN) 100 MG capsule Take 100 mg by mouth 3 (three) times daily.    . Iron, Ferrous Sulfate, 142 (45 Fe) MG TBCR Take 45 mg of iron by mouth daily at 6 (six) AM.    . levothyroxine (SYNTHROID) 88 MCG tablet     . lisinopril-hydrochlorothiazide (PRINZIDE,ZESTORETIC) 20-12.5 MG per tablet Take 1 tablet by mouth. (once or 2 times daily??)    . MODERNA COVID-19 VACCINE 100 MCG/0.5ML injection     . Multiple Vitamins-Minerals (OCUVITE EYE + MULTI) TABS Take by mouth.    . oxyCODONE (OXY IR/ROXICODONE) 5 MG immediate release tablet TAKE 1 TABLET BY MOUTH EVERY 4 HOURS AS NEEDED FOR MODERATE TO SEVERE PAIN (Patient not taking: Reported on 12/15/2020)    . pravastatin (PRAVACHOL) 40 MG tablet Take 40 mg by mouth daily.     No current facility-administered medications on file prior to visit.   Allergies  Allergen Reactions  . Penicillins     No results found for  this or any previous visit (from the past 2160 hour(s)).  Objective: General: Patient is awake, alert, and oriented x 3 and in no acute distress.  Integument: Skin is warm, dry and supple bilateral. Nails are tender, long, thickened and dystrophic with subungual debris, consistent with onychomycosis, 1-5 bilateral. No signs of infection.  No open lesions or preulcerative lesions present bilateral. Minimal callus to 1st toes bilateral and dry fissures. Remaining integument unremarkable.  Vasculature:  Dorsalis Pedis pulse 2/4 bilateral. Posterior Tibial pulse  1/4 bilateral. Capillary fill time <3 sec 1-5 bilateral. Positive hair growth to the level of the digits.Temperature gradient within normal limits.  Mild varicosities and venous pigmentation present bilateral.  +1 pitting edema present bilateral.  Neurology: The patient has diminished sensation measured with a 5.07/10g Semmes Weinstein Monofilament at all pedal sites bilateral.  Musculoskeletal: Asymptomatic hammertoe and pes planus pedal deformities noted bilateral.  Assessment and Plan: Problem List Items Addressed This Visit   None   Visit Diagnoses    Pain due to onychomycosis of toenails of both feet    -  Primary   Foot callus       Diabetic polyneuropathy associated with type 2 diabetes mellitus ([facility])       PVD (peripheral vascular disease) ([place])       Hammer toe, unspecified laterality  Pes planus of both feet          -Examined patient. -Re-Discussed and educated patient on diabetic foot care -Mechanically debrided all nails 1-5 bilateral using sterile nail nipper and filed with dremel without incident  -Diabetic shoes were dispensed patient was well satisfied shoes fit appropriately without any pedal complaints.  Patient appeared to be satisfied with her new shoes. -Advised elevation to help with swelling continue with cardiology and pulmonology follow-up for COPD -Patient to return in 3 months for at risk foot  care -Patient advised to call the office if any problems or questions arise in the meantime.  Landis Martins, DPM

## 2021-02-28 DIAGNOSIS — E1169 Type 2 diabetes mellitus with other specified complication: Secondary | ICD-10-CM | POA: Diagnosis not present

## 2021-02-28 DIAGNOSIS — J449 Chronic obstructive pulmonary disease, unspecified: Secondary | ICD-10-CM | POA: Diagnosis not present

## 2021-02-28 DIAGNOSIS — I129 Hypertensive chronic kidney disease with stage 1 through stage 4 chronic kidney disease, or unspecified chronic kidney disease: Secondary | ICD-10-CM | POA: Diagnosis not present

## 2021-03-10 DIAGNOSIS — J45909 Unspecified asthma, uncomplicated: Secondary | ICD-10-CM | POA: Diagnosis not present

## 2021-03-30 DIAGNOSIS — I129 Hypertensive chronic kidney disease with stage 1 through stage 4 chronic kidney disease, or unspecified chronic kidney disease: Secondary | ICD-10-CM | POA: Diagnosis not present

## 2021-03-30 DIAGNOSIS — E1169 Type 2 diabetes mellitus with other specified complication: Secondary | ICD-10-CM | POA: Diagnosis not present

## 2021-03-30 DIAGNOSIS — J449 Chronic obstructive pulmonary disease, unspecified: Secondary | ICD-10-CM | POA: Diagnosis not present

## 2021-04-03 DIAGNOSIS — D224 Melanocytic nevi of scalp and neck: Secondary | ICD-10-CM | POA: Diagnosis not present

## 2021-04-03 DIAGNOSIS — D171 Benign lipomatous neoplasm of skin and subcutaneous tissue of trunk: Secondary | ICD-10-CM | POA: Diagnosis not present

## 2021-04-04 DIAGNOSIS — L820 Inflamed seborrheic keratosis: Secondary | ICD-10-CM | POA: Diagnosis not present

## 2021-04-09 DIAGNOSIS — J45909 Unspecified asthma, uncomplicated: Secondary | ICD-10-CM | POA: Diagnosis not present

## 2021-04-12 ENCOUNTER — Other Ambulatory Visit: Payer: Self-pay

## 2021-04-12 DIAGNOSIS — M25551 Pain in right hip: Secondary | ICD-10-CM | POA: Diagnosis not present

## 2021-04-12 DIAGNOSIS — K5730 Diverticulosis of large intestine without perforation or abscess without bleeding: Secondary | ICD-10-CM | POA: Diagnosis not present

## 2021-04-12 DIAGNOSIS — R2241 Localized swelling, mass and lump, right lower limb: Secondary | ICD-10-CM | POA: Diagnosis not present

## 2021-04-12 DIAGNOSIS — Z853 Personal history of malignant neoplasm of breast: Secondary | ICD-10-CM | POA: Diagnosis not present

## 2021-04-12 DIAGNOSIS — I700 Atherosclerosis of aorta: Secondary | ICD-10-CM | POA: Diagnosis not present

## 2021-04-12 DIAGNOSIS — K760 Fatty (change of) liver, not elsewhere classified: Secondary | ICD-10-CM | POA: Diagnosis not present

## 2021-04-12 DIAGNOSIS — D171 Benign lipomatous neoplasm of skin and subcutaneous tissue of trunk: Secondary | ICD-10-CM | POA: Diagnosis not present

## 2021-04-12 DIAGNOSIS — M7989 Other specified soft tissue disorders: Secondary | ICD-10-CM | POA: Diagnosis not present

## 2021-04-12 NOTE — Patient Outreach (Signed)
[facility]) Care Management  04/12/2021  Charese Abundis [birth year] [id number]   Telephone Screen  Referral Date: 04/12/2021 Referral Source: [employer] Nurse Line/MD Office Referral Reason: "COPD,fall, issue with meds"    Outreach attempt # 1 to patient. No answer. RN CM left HIPAA compliant voicemail message along with contact info.     Plan: RN CM will make outreach attempt to patient within 3-4 business days.   Enzo Montgomery, RN,BSN,CCM [place] Management Telephonic Care Management Coordinator Direct Phone: [phone] Toll Free: [phone] Fax: [phone]

## 2021-04-13 DIAGNOSIS — E1142 Type 2 diabetes mellitus with diabetic polyneuropathy: Secondary | ICD-10-CM | POA: Diagnosis not present

## 2021-04-13 DIAGNOSIS — Z6839 Body mass index (BMI) 39.0-39.9, adult: Secondary | ICD-10-CM | POA: Diagnosis not present

## 2021-04-13 DIAGNOSIS — J449 Chronic obstructive pulmonary disease, unspecified: Secondary | ICD-10-CM | POA: Diagnosis not present

## 2021-04-17 ENCOUNTER — Other Ambulatory Visit: Payer: Self-pay

## 2021-04-17 NOTE — Patient Outreach (Signed)
[facility]) Care Management  04/17/2021  [name] [name] 06-15-1941 [id number]   Telephone Screen   Referral Date: 04/12/2021 Referral Source: [employer] Nurse Line/MD Office Referral Reason: "COPD,fall, issue with meds"   Outreach attempt #2 to patient. No answer at both numbers listed for patient. RN CM left HIPAA compliant voicemail message along with contact info.      Plan: RN CM will make outreach attempt within 3-4 business days if no return call.   Enzo Montgomery, RN,BSN,CCM [place] Management Telephonic Care Management Coordinator Direct Phone: [phone] Free: [phone] Fax: [phone]

## 2021-04-18 ENCOUNTER — Ambulatory Visit: Payer: Self-pay

## 2021-04-19 ENCOUNTER — Other Ambulatory Visit: Payer: Self-pay

## 2021-04-19 NOTE — Patient Outreach (Signed)
[place] [facility]) Care Management  04/19/2021  [name] [name] 04-02-1941 [id number]   Telephone Screen   Referral Date: 04/12/2021 Referral Source: [employer] Nurse Line/MD Office Referral Reason: "COPD,fall, issue with meds"   Outreach attempt # 3 to patient. No answer at numbers listed for patient.       Plan: RN CM will make outreach attempt to patient within 3-4 wks if no response from letter mailed to patient.  Enzo Montgomery, RN,BSN,CCM [place] Management Telephonic Care Management Coordinator Direct Phone: [phone] Toll Free: [phone] Fax: [phone]

## 2021-04-20 DIAGNOSIS — E1169 Type 2 diabetes mellitus with other specified complication: Secondary | ICD-10-CM | POA: Diagnosis not present

## 2021-04-20 DIAGNOSIS — E039 Hypothyroidism, unspecified: Secondary | ICD-10-CM | POA: Diagnosis not present

## 2021-04-27 DIAGNOSIS — E1169 Type 2 diabetes mellitus with other specified complication: Secondary | ICD-10-CM | POA: Diagnosis not present

## 2021-04-27 DIAGNOSIS — N1831 Chronic kidney disease, stage 3a: Secondary | ICD-10-CM | POA: Diagnosis not present

## 2021-04-27 DIAGNOSIS — E782 Mixed hyperlipidemia: Secondary | ICD-10-CM | POA: Diagnosis not present

## 2021-04-30 DIAGNOSIS — J449 Chronic obstructive pulmonary disease, unspecified: Secondary | ICD-10-CM | POA: Diagnosis not present

## 2021-04-30 DIAGNOSIS — E1142 Type 2 diabetes mellitus with diabetic polyneuropathy: Secondary | ICD-10-CM | POA: Diagnosis not present

## 2021-04-30 DIAGNOSIS — I129 Hypertensive chronic kidney disease with stage 1 through stage 4 chronic kidney disease, or unspecified chronic kidney disease: Secondary | ICD-10-CM | POA: Diagnosis not present

## 2021-05-10 DIAGNOSIS — J45909 Unspecified asthma, uncomplicated: Secondary | ICD-10-CM | POA: Diagnosis not present

## 2021-05-15 ENCOUNTER — Other Ambulatory Visit: Payer: Self-pay

## 2021-05-15 NOTE — Patient Outreach (Signed)
[place] [facility]) Care Management  05/15/2021  [name] [name] 1941-07-04 [id number]   Telephone Screen   Referral Date: 04/12/2021 Referral Source: [facility] Nurse Line/MD Office Referral Reason: "COPD,fall, issue with meds"   Multiple attempts to establish contact with patient without success. No response from letter mailed to patient. Case is being closed at this time.     Plan: RN CM will close case at this time.     Enzo Montgomery, RN,BSN,CCM [place] Management Telephonic Care Management Coordinator Direct Phone: [phone] Toll Free: [phone] Fax: [phone]

## 2021-05-16 DIAGNOSIS — K006 Disturbances in tooth eruption: Secondary | ICD-10-CM | POA: Diagnosis not present

## 2021-05-16 DIAGNOSIS — R69 Illness, unspecified: Secondary | ICD-10-CM | POA: Diagnosis not present

## 2021-05-25 ENCOUNTER — Ambulatory Visit: Payer: Medicare HMO | Admitting: Sports Medicine

## 2021-05-25 ENCOUNTER — Other Ambulatory Visit: Payer: Self-pay

## 2021-05-25 ENCOUNTER — Encounter: Payer: Self-pay | Admitting: Sports Medicine

## 2021-05-25 DIAGNOSIS — M79675 Pain in left toe(s): Secondary | ICD-10-CM | POA: Diagnosis not present

## 2021-05-25 DIAGNOSIS — L84 Corns and callosities: Secondary | ICD-10-CM

## 2021-05-25 DIAGNOSIS — M79674 Pain in right toe(s): Secondary | ICD-10-CM | POA: Diagnosis not present

## 2021-05-25 DIAGNOSIS — B351 Tinea unguium: Secondary | ICD-10-CM

## 2021-05-25 DIAGNOSIS — I739 Peripheral vascular disease, unspecified: Secondary | ICD-10-CM | POA: Diagnosis not present

## 2021-05-25 DIAGNOSIS — E1142 Type 2 diabetes mellitus with diabetic polyneuropathy: Secondary | ICD-10-CM | POA: Diagnosis not present

## 2021-05-25 NOTE — Progress Notes (Signed)
Subjective: [name] [name] is a 80 y.o. female patient with history of diabetes who returns to office today complaining of long,mildly painful nails while ambulating in shoes; unable to trim.  Patient reports that she is dealing with a lot of swelling in both feet and legs and was diagnosed with COPD.  No other issues noted.  Fasting blood sugar patient does not check Last A1c 7 ish Last visit to PCP x May   Patient Active Problem List   Diagnosis Date Noted   Intraductal carcinoma in situ of right breast 04/27/2014   Current Outpatient Medications on File Prior to Visit  Medication Sig Dispense Refill   albuterol (VENTOLIN HFA) 108 (90 Base) MCG/ACT inhaler      aspirin EC 81 MG tablet Take 81 mg by mouth daily. Swallow whole.     Blood Glucose Monitoring Suppl (ACCU-CHEK GUIDE ME) w/Device KIT      diphenhydrAMINE (BENADRYL) 25 MG tablet Take 25 mg by mouth at bedtime as needed for sleep.     Fluticasone-Umeclidin-Vilant (TRELEGY ELLIPTA) 200-62.5-25 MCG/INH AEPB Inhale into the lungs.     furosemide (LASIX) 20 MG tablet Take 20 mg by mouth.     gabapentin (NEURONTIN) 100 MG capsule Take 100 mg by mouth 3 (three) times daily.     Iron, Ferrous Sulfate, 142 (45 Fe) MG TBCR Take 45 mg of iron by mouth daily at 6 (six) AM.     levothyroxine (SYNTHROID) 88 MCG tablet      lisinopril-hydrochlorothiazide (PRINZIDE,ZESTORETIC) 20-12.5 MG per tablet Take 1 tablet by mouth. (once or 2 times daily??)     MODERNA COVID-19 VACCINE 100 MCG/0.5ML injection      Multiple Vitamins-Minerals (OCUVITE EYE + MULTI) TABS Take by mouth.     oxyCODONE (OXY IR/ROXICODONE) 5 MG immediate release tablet TAKE 1 TABLET BY MOUTH EVERY 4 HOURS AS NEEDED FOR MODERATE TO SEVERE PAIN (Patient not taking: Reported on 12/15/2020)     pravastatin (PRAVACHOL) 40 MG tablet Take 40 mg by mouth daily.     No current facility-administered medications on file prior to visit.   Allergies  Allergen Reactions    Penicillins     No results found for this or any previous visit (from the past 2160 hour(s)).  Objective: General: Patient is awake, alert, and oriented x 3 and in no acute distress.  Integument: Skin is warm, dry and supple bilateral. Nails are tender, long, thickened and dystrophic with subungual debris, consistent with onychomycosis, 1-5 bilateral. No signs of infection.  No open lesions or preulcerative lesions present bilateral. Minimal callus to 1st toes bilateral and dry fissures. Remaining integument unremarkable.  Vasculature:  Dorsalis Pedis pulse 2/4 bilateral. Posterior Tibial pulse  1/4 bilateral. Capillary fill time <3 sec 1-5 bilateral. Positive hair growth to the level of the digits.Temperature gradient within normal limits.  Mild varicosities and venous pigmentation present bilateral.  +1 pitting edema present bilateral.  Neurology: The patient has diminished sensation measured with a 5.07/10g Semmes Weinstein Monofilament at all pedal sites bilateral.  Musculoskeletal: Asymptomatic hammertoe and pes planus pedal deformities noted bilateral.  Assessment and Plan: Problem List Items Addressed This Visit   None Visit Diagnoses     Pain due to onychomycosis of toenails of both feet    -  Primary   Foot callus       Diabetic polyneuropathy associated with type 2 diabetes mellitus ([place])       PVD (peripheral vascular disease) ([place])           -  Examined patient. -Re-Discussed and educated patient on diabetic foot care -Mechanically debrided all nails 1-5 bilateral using sterile nail nipper and filed with dremel without incident  -Advised elevation to help with swelling continue with cardiology and pulmonology follow-up for COPD -Patient to return in 3 months for at risk foot care -Patient advised to call the office if any problems or questions arise in the meantime.  Landis Martins, DPM

## 2021-05-31 DIAGNOSIS — I129 Hypertensive chronic kidney disease with stage 1 through stage 4 chronic kidney disease, or unspecified chronic kidney disease: Secondary | ICD-10-CM | POA: Diagnosis not present

## 2021-05-31 DIAGNOSIS — E1142 Type 2 diabetes mellitus with diabetic polyneuropathy: Secondary | ICD-10-CM | POA: Diagnosis not present

## 2021-05-31 DIAGNOSIS — J449 Chronic obstructive pulmonary disease, unspecified: Secondary | ICD-10-CM | POA: Diagnosis not present

## 2021-06-10 DIAGNOSIS — J45909 Unspecified asthma, uncomplicated: Secondary | ICD-10-CM | POA: Diagnosis not present

## 2021-06-11 DIAGNOSIS — K006 Disturbances in tooth eruption: Secondary | ICD-10-CM | POA: Diagnosis not present

## 2021-06-11 DIAGNOSIS — R69 Illness, unspecified: Secondary | ICD-10-CM | POA: Diagnosis not present

## 2021-06-30 DIAGNOSIS — I129 Hypertensive chronic kidney disease with stage 1 through stage 4 chronic kidney disease, or unspecified chronic kidney disease: Secondary | ICD-10-CM | POA: Diagnosis not present

## 2021-06-30 DIAGNOSIS — E1142 Type 2 diabetes mellitus with diabetic polyneuropathy: Secondary | ICD-10-CM | POA: Diagnosis not present

## 2021-06-30 DIAGNOSIS — J449 Chronic obstructive pulmonary disease, unspecified: Secondary | ICD-10-CM | POA: Diagnosis not present

## 2021-07-10 DIAGNOSIS — J45909 Unspecified asthma, uncomplicated: Secondary | ICD-10-CM | POA: Diagnosis not present

## 2021-07-31 DIAGNOSIS — E1142 Type 2 diabetes mellitus with diabetic polyneuropathy: Secondary | ICD-10-CM | POA: Diagnosis not present

## 2021-07-31 DIAGNOSIS — J449 Chronic obstructive pulmonary disease, unspecified: Secondary | ICD-10-CM | POA: Diagnosis not present

## 2021-07-31 DIAGNOSIS — I129 Hypertensive chronic kidney disease with stage 1 through stage 4 chronic kidney disease, or unspecified chronic kidney disease: Secondary | ICD-10-CM | POA: Diagnosis not present

## 2021-08-10 DIAGNOSIS — J45909 Unspecified asthma, uncomplicated: Secondary | ICD-10-CM | POA: Diagnosis not present

## 2021-08-16 DIAGNOSIS — J020 Streptococcal pharyngitis: Secondary | ICD-10-CM | POA: Diagnosis not present

## 2021-08-16 DIAGNOSIS — R600 Localized edema: Secondary | ICD-10-CM | POA: Diagnosis not present

## 2021-08-16 DIAGNOSIS — Z20822 Contact with and (suspected) exposure to covid-19: Secondary | ICD-10-CM | POA: Diagnosis not present

## 2021-08-16 DIAGNOSIS — R0602 Shortness of breath: Secondary | ICD-10-CM | POA: Diagnosis not present

## 2021-08-28 DIAGNOSIS — E1169 Type 2 diabetes mellitus with other specified complication: Secondary | ICD-10-CM | POA: Diagnosis not present

## 2021-08-28 DIAGNOSIS — E039 Hypothyroidism, unspecified: Secondary | ICD-10-CM | POA: Diagnosis not present

## 2021-08-29 ENCOUNTER — Ambulatory Visit: Payer: Medicare HMO | Admitting: Sports Medicine

## 2021-08-30 DIAGNOSIS — R0602 Shortness of breath: Secondary | ICD-10-CM | POA: Diagnosis not present

## 2021-08-30 DIAGNOSIS — J441 Chronic obstructive pulmonary disease with (acute) exacerbation: Secondary | ICD-10-CM | POA: Diagnosis not present

## 2021-08-30 DIAGNOSIS — E782 Mixed hyperlipidemia: Secondary | ICD-10-CM | POA: Diagnosis not present

## 2021-08-30 DIAGNOSIS — E1169 Type 2 diabetes mellitus with other specified complication: Secondary | ICD-10-CM | POA: Diagnosis not present

## 2021-08-31 DIAGNOSIS — R062 Wheezing: Secondary | ICD-10-CM | POA: Diagnosis not present

## 2021-08-31 DIAGNOSIS — J449 Chronic obstructive pulmonary disease, unspecified: Secondary | ICD-10-CM | POA: Diagnosis not present

## 2021-08-31 DIAGNOSIS — R059 Cough, unspecified: Secondary | ICD-10-CM | POA: Diagnosis not present

## 2021-08-31 DIAGNOSIS — R0602 Shortness of breath: Secondary | ICD-10-CM | POA: Diagnosis not present

## 2021-08-31 DIAGNOSIS — I517 Cardiomegaly: Secondary | ICD-10-CM | POA: Diagnosis not present

## 2021-09-06 DIAGNOSIS — J449 Chronic obstructive pulmonary disease, unspecified: Secondary | ICD-10-CM | POA: Diagnosis not present

## 2021-09-06 DIAGNOSIS — R0602 Shortness of breath: Secondary | ICD-10-CM | POA: Diagnosis not present

## 2021-09-06 DIAGNOSIS — I517 Cardiomegaly: Secondary | ICD-10-CM | POA: Diagnosis not present

## 2021-09-06 DIAGNOSIS — R609 Edema, unspecified: Secondary | ICD-10-CM | POA: Diagnosis not present

## 2021-09-07 DIAGNOSIS — R600 Localized edema: Secondary | ICD-10-CM | POA: Diagnosis not present

## 2021-09-07 DIAGNOSIS — R0602 Shortness of breath: Secondary | ICD-10-CM | POA: Diagnosis not present

## 2021-09-07 DIAGNOSIS — I509 Heart failure, unspecified: Secondary | ICD-10-CM | POA: Diagnosis not present

## 2021-09-07 DIAGNOSIS — R059 Cough, unspecified: Secondary | ICD-10-CM | POA: Diagnosis not present

## 2021-09-07 DIAGNOSIS — R778 Other specified abnormalities of plasma proteins: Secondary | ICD-10-CM | POA: Diagnosis not present

## 2021-09-07 DIAGNOSIS — I517 Cardiomegaly: Secondary | ICD-10-CM | POA: Diagnosis not present

## 2021-09-07 DIAGNOSIS — I342 Nonrheumatic mitral (valve) stenosis: Secondary | ICD-10-CM | POA: Diagnosis not present

## 2021-09-07 DIAGNOSIS — I052 Rheumatic mitral stenosis with insufficiency: Secondary | ICD-10-CM | POA: Diagnosis not present

## 2021-09-07 DIAGNOSIS — E7800 Pure hypercholesterolemia, unspecified: Secondary | ICD-10-CM | POA: Diagnosis not present

## 2021-09-07 DIAGNOSIS — I110 Hypertensive heart disease with heart failure: Secondary | ICD-10-CM | POA: Diagnosis not present

## 2021-09-07 DIAGNOSIS — E119 Type 2 diabetes mellitus without complications: Secondary | ICD-10-CM | POA: Diagnosis not present

## 2021-09-09 DIAGNOSIS — J45909 Unspecified asthma, uncomplicated: Secondary | ICD-10-CM | POA: Diagnosis not present

## 2021-09-11 DIAGNOSIS — Z9181 History of falling: Secondary | ICD-10-CM | POA: Diagnosis not present

## 2021-09-11 DIAGNOSIS — Z96653 Presence of artificial knee joint, bilateral: Secondary | ICD-10-CM | POA: Diagnosis not present

## 2021-09-11 DIAGNOSIS — J449 Chronic obstructive pulmonary disease, unspecified: Secondary | ICD-10-CM | POA: Diagnosis not present

## 2021-09-11 DIAGNOSIS — Z7984 Long term (current) use of oral hypoglycemic drugs: Secondary | ICD-10-CM | POA: Diagnosis not present

## 2021-09-11 DIAGNOSIS — I110 Hypertensive heart disease with heart failure: Secondary | ICD-10-CM | POA: Diagnosis not present

## 2021-09-11 DIAGNOSIS — Z87440 Personal history of urinary (tract) infections: Secondary | ICD-10-CM | POA: Diagnosis not present

## 2021-09-11 DIAGNOSIS — Z8616 Personal history of COVID-19: Secondary | ICD-10-CM | POA: Diagnosis not present

## 2021-09-11 DIAGNOSIS — M1990 Unspecified osteoarthritis, unspecified site: Secondary | ICD-10-CM | POA: Diagnosis not present

## 2021-09-11 DIAGNOSIS — E7800 Pure hypercholesterolemia, unspecified: Secondary | ICD-10-CM | POA: Diagnosis not present

## 2021-09-11 DIAGNOSIS — Z96651 Presence of right artificial knee joint: Secondary | ICD-10-CM | POA: Diagnosis not present

## 2021-09-11 DIAGNOSIS — E119 Type 2 diabetes mellitus without complications: Secondary | ICD-10-CM | POA: Diagnosis not present

## 2021-09-11 DIAGNOSIS — Z7982 Long term (current) use of aspirin: Secondary | ICD-10-CM | POA: Diagnosis not present

## 2021-09-11 DIAGNOSIS — Z86711 Personal history of pulmonary embolism: Secondary | ICD-10-CM | POA: Diagnosis not present

## 2021-09-11 DIAGNOSIS — I509 Heart failure, unspecified: Secondary | ICD-10-CM | POA: Diagnosis not present

## 2021-09-13 DIAGNOSIS — Z23 Encounter for immunization: Secondary | ICD-10-CM | POA: Diagnosis not present

## 2021-09-13 DIAGNOSIS — I5030 Unspecified diastolic (congestive) heart failure: Secondary | ICD-10-CM | POA: Diagnosis not present

## 2021-09-13 DIAGNOSIS — I350 Nonrheumatic aortic (valve) stenosis: Secondary | ICD-10-CM | POA: Diagnosis not present

## 2021-09-13 DIAGNOSIS — I050 Rheumatic mitral stenosis: Secondary | ICD-10-CM | POA: Diagnosis not present

## 2021-09-14 DIAGNOSIS — Z86711 Personal history of pulmonary embolism: Secondary | ICD-10-CM | POA: Diagnosis not present

## 2021-09-14 DIAGNOSIS — E7800 Pure hypercholesterolemia, unspecified: Secondary | ICD-10-CM | POA: Diagnosis not present

## 2021-09-14 DIAGNOSIS — Z7984 Long term (current) use of oral hypoglycemic drugs: Secondary | ICD-10-CM | POA: Diagnosis not present

## 2021-09-14 DIAGNOSIS — Z9181 History of falling: Secondary | ICD-10-CM | POA: Diagnosis not present

## 2021-09-14 DIAGNOSIS — Z87440 Personal history of urinary (tract) infections: Secondary | ICD-10-CM | POA: Diagnosis not present

## 2021-09-14 DIAGNOSIS — Z8616 Personal history of COVID-19: Secondary | ICD-10-CM | POA: Diagnosis not present

## 2021-09-14 DIAGNOSIS — Z96651 Presence of right artificial knee joint: Secondary | ICD-10-CM | POA: Diagnosis not present

## 2021-09-14 DIAGNOSIS — M1990 Unspecified osteoarthritis, unspecified site: Secondary | ICD-10-CM | POA: Diagnosis not present

## 2021-09-14 DIAGNOSIS — Z7982 Long term (current) use of aspirin: Secondary | ICD-10-CM | POA: Diagnosis not present

## 2021-09-14 DIAGNOSIS — Z96653 Presence of artificial knee joint, bilateral: Secondary | ICD-10-CM | POA: Diagnosis not present

## 2021-09-14 DIAGNOSIS — J449 Chronic obstructive pulmonary disease, unspecified: Secondary | ICD-10-CM | POA: Diagnosis not present

## 2021-09-14 DIAGNOSIS — I509 Heart failure, unspecified: Secondary | ICD-10-CM | POA: Diagnosis not present

## 2021-09-14 DIAGNOSIS — I110 Hypertensive heart disease with heart failure: Secondary | ICD-10-CM | POA: Diagnosis not present

## 2021-09-14 DIAGNOSIS — E119 Type 2 diabetes mellitus without complications: Secondary | ICD-10-CM | POA: Diagnosis not present

## 2021-09-18 DIAGNOSIS — Z9181 History of falling: Secondary | ICD-10-CM | POA: Diagnosis not present

## 2021-09-18 DIAGNOSIS — E119 Type 2 diabetes mellitus without complications: Secondary | ICD-10-CM | POA: Diagnosis not present

## 2021-09-18 DIAGNOSIS — Z96653 Presence of artificial knee joint, bilateral: Secondary | ICD-10-CM | POA: Diagnosis not present

## 2021-09-18 DIAGNOSIS — E7800 Pure hypercholesterolemia, unspecified: Secondary | ICD-10-CM | POA: Diagnosis not present

## 2021-09-18 DIAGNOSIS — Z96651 Presence of right artificial knee joint: Secondary | ICD-10-CM | POA: Diagnosis not present

## 2021-09-18 DIAGNOSIS — Z87440 Personal history of urinary (tract) infections: Secondary | ICD-10-CM | POA: Diagnosis not present

## 2021-09-18 DIAGNOSIS — Z7984 Long term (current) use of oral hypoglycemic drugs: Secondary | ICD-10-CM | POA: Diagnosis not present

## 2021-09-18 DIAGNOSIS — J449 Chronic obstructive pulmonary disease, unspecified: Secondary | ICD-10-CM | POA: Diagnosis not present

## 2021-09-18 DIAGNOSIS — Z7982 Long term (current) use of aspirin: Secondary | ICD-10-CM | POA: Diagnosis not present

## 2021-09-18 DIAGNOSIS — I110 Hypertensive heart disease with heart failure: Secondary | ICD-10-CM | POA: Diagnosis not present

## 2021-09-18 DIAGNOSIS — I509 Heart failure, unspecified: Secondary | ICD-10-CM | POA: Diagnosis not present

## 2021-09-18 DIAGNOSIS — Z86711 Personal history of pulmonary embolism: Secondary | ICD-10-CM | POA: Diagnosis not present

## 2021-09-18 DIAGNOSIS — M1990 Unspecified osteoarthritis, unspecified site: Secondary | ICD-10-CM | POA: Diagnosis not present

## 2021-09-18 DIAGNOSIS — Z8616 Personal history of COVID-19: Secondary | ICD-10-CM | POA: Diagnosis not present

## 2021-09-19 ENCOUNTER — Other Ambulatory Visit: Payer: Self-pay

## 2021-09-19 ENCOUNTER — Ambulatory Visit: Payer: Medicare Other | Admitting: Sports Medicine

## 2021-09-19 ENCOUNTER — Encounter: Payer: Self-pay | Admitting: Sports Medicine

## 2021-09-19 DIAGNOSIS — M79674 Pain in right toe(s): Secondary | ICD-10-CM | POA: Diagnosis not present

## 2021-09-19 DIAGNOSIS — M2142 Flat foot [pes planus] (acquired), left foot: Secondary | ICD-10-CM

## 2021-09-19 DIAGNOSIS — M79675 Pain in left toe(s): Secondary | ICD-10-CM

## 2021-09-19 DIAGNOSIS — I739 Peripheral vascular disease, unspecified: Secondary | ICD-10-CM

## 2021-09-19 DIAGNOSIS — M2040 Other hammer toe(s) (acquired), unspecified foot: Secondary | ICD-10-CM

## 2021-09-19 DIAGNOSIS — B351 Tinea unguium: Secondary | ICD-10-CM | POA: Diagnosis not present

## 2021-09-19 DIAGNOSIS — E1142 Type 2 diabetes mellitus with diabetic polyneuropathy: Secondary | ICD-10-CM | POA: Diagnosis not present

## 2021-09-19 DIAGNOSIS — L84 Corns and callosities: Secondary | ICD-10-CM

## 2021-09-19 DIAGNOSIS — M2141 Flat foot [pes planus] (acquired), right foot: Secondary | ICD-10-CM

## 2021-09-19 NOTE — Progress Notes (Signed)
Subjective: [name] [name] is a 80 y.o. female patient with history of diabetes who returns to office today complaining of long,mildly painful nails while ambulating in shoes; unable to trim.  States that she has been having a difficult time with fluid retention.  No other issues noted.  Fasting blood sugar patient does not check Last A1c 5.9 Last visit to PCP last week on Thursday  Patient Active Problem List   Diagnosis Date Noted   Intraductal carcinoma in situ of right breast 04/27/2014   Current Outpatient Medications on File Prior to Visit  Medication Sig Dispense Refill   albuterol (VENTOLIN HFA) 108 (90 Base) MCG/ACT inhaler      aspirin EC 81 MG tablet Take 81 mg by mouth daily. Swallow whole.     Blood Glucose Monitoring Suppl (ACCU-CHEK GUIDE ME) w/Device KIT      diphenhydrAMINE (BENADRYL) 25 MG tablet Take 25 mg by mouth at bedtime as needed for sleep.     Fluticasone-Umeclidin-Vilant (TRELEGY ELLIPTA) 200-62.5-25 MCG/INH AEPB Inhale into the lungs.     furosemide (LASIX) 20 MG tablet Take 20 mg by mouth.     gabapentin (NEURONTIN) 100 MG capsule Take 100 mg by mouth 3 (three) times daily.     Iron, Ferrous Sulfate, 142 (45 Fe) MG TBCR Take 45 mg of iron by mouth daily at 6 (six) AM.     levothyroxine (SYNTHROID) 88 MCG tablet      lisinopril-hydrochlorothiazide (PRINZIDE,ZESTORETIC) 20-12.5 MG per tablet Take 1 tablet by mouth. (once or 2 times daily??)     MODERNA COVID-19 VACCINE 100 MCG/0.5ML injection      Multiple Vitamins-Minerals (OCUVITE EYE + MULTI) TABS Take by mouth.     oxyCODONE (OXY IR/ROXICODONE) 5 MG immediate release tablet TAKE 1 TABLET BY MOUTH EVERY 4 HOURS AS NEEDED FOR MODERATE TO SEVERE PAIN (Patient not taking: Reported on 12/15/2020)     pravastatin (PRAVACHOL) 40 MG tablet Take 40 mg by mouth daily.     No current facility-administered medications on file prior to visit.   Allergies  Allergen Reactions   Penicillins     No  results found for this or any previous visit (from the past 2160 hour(s)).  Objective: General: Patient is awake, alert, and oriented x 3 and in no acute distress.  Integument: Skin is warm, dry and supple bilateral. Nails are tender, long, thickened and dystrophic with subungual debris, consistent with onychomycosis, 1-5 bilateral. No signs of infection.  No open lesions or preulcerative lesions present bilateral. Minimal callus to 1st toes bilateral and dry fissures. Remaining integument unremarkable.  Vasculature:  Dorsalis Pedis pulse 2/4 bilateral. Posterior Tibial pulse  1/4 bilateral. Capillary fill time <3 sec 1-5 bilateral. Positive hair growth to the level of the digits.Temperature gradient within normal limits.  Mild varicosities and venous pigmentation present bilateral.  +1 pitting edema present bilateral.  Neurology: The patient has diminished sensation measured with a 5.07/10g Semmes Weinstein Monofilament at all pedal sites bilateral.  Musculoskeletal: Asymptomatic hammertoe and pes planus pedal deformities noted bilateral.  Assessment and Plan: Problem List Items Addressed This Visit   None Visit Diagnoses     Pain due to onychomycosis of toenails of both feet    -  Primary   Foot callus       Diabetic polyneuropathy associated with type 2 diabetes mellitus (HCC)       PVD (peripheral vascular disease) ([place])       Hammer toe, unspecified laterality  Pes planus of both feet           -Examined patient. -Re-Discussed and educated patient on diabetic foot care -Mechanically debrided all nails 1-5 bilateral using sterile nail nipper and filed with dremel without incident  -Continue with elevation of legs and medical management for edema -Advised patient that we will asked my office staff to look into why her diabetic shoes were not covered at this time; message sent to [facility] to look into this for me -Patient to return in 3 months for at risk foot care -Patient  advised to call the office if any problems or questions arise in the meantime.  Landis Martins, DPM

## 2021-09-20 ENCOUNTER — Telehealth: Payer: Self-pay | Admitting: Sports Medicine

## 2021-09-20 NOTE — Telephone Encounter (Signed)
Per Jocelyn Lamer there is no balance due from pt.   I called and notified pt and she said she got a bill in November for over 300 dollars and she did send in a payment of over 100 in November. I told her our billing stated there was no balance owed by pt and she said that was a good christmas present and thank you.

## 2021-09-26 DIAGNOSIS — I110 Hypertensive heart disease with heart failure: Secondary | ICD-10-CM | POA: Diagnosis not present

## 2021-09-26 DIAGNOSIS — Z9181 History of falling: Secondary | ICD-10-CM | POA: Diagnosis not present

## 2021-09-26 DIAGNOSIS — Z96653 Presence of artificial knee joint, bilateral: Secondary | ICD-10-CM | POA: Diagnosis not present

## 2021-09-26 DIAGNOSIS — M1990 Unspecified osteoarthritis, unspecified site: Secondary | ICD-10-CM | POA: Diagnosis not present

## 2021-09-26 DIAGNOSIS — Z7982 Long term (current) use of aspirin: Secondary | ICD-10-CM | POA: Diagnosis not present

## 2021-09-26 DIAGNOSIS — Z86711 Personal history of pulmonary embolism: Secondary | ICD-10-CM | POA: Diagnosis not present

## 2021-09-26 DIAGNOSIS — I509 Heart failure, unspecified: Secondary | ICD-10-CM | POA: Diagnosis not present

## 2021-09-26 DIAGNOSIS — Z8616 Personal history of COVID-19: Secondary | ICD-10-CM | POA: Diagnosis not present

## 2021-09-26 DIAGNOSIS — Z87440 Personal history of urinary (tract) infections: Secondary | ICD-10-CM | POA: Diagnosis not present

## 2021-09-26 DIAGNOSIS — J449 Chronic obstructive pulmonary disease, unspecified: Secondary | ICD-10-CM | POA: Diagnosis not present

## 2021-09-26 DIAGNOSIS — Z7984 Long term (current) use of oral hypoglycemic drugs: Secondary | ICD-10-CM | POA: Diagnosis not present

## 2021-09-26 DIAGNOSIS — E7800 Pure hypercholesterolemia, unspecified: Secondary | ICD-10-CM | POA: Diagnosis not present

## 2021-09-26 DIAGNOSIS — Z96651 Presence of right artificial knee joint: Secondary | ICD-10-CM | POA: Diagnosis not present

## 2021-09-26 DIAGNOSIS — E119 Type 2 diabetes mellitus without complications: Secondary | ICD-10-CM | POA: Diagnosis not present

## 2021-09-30 DIAGNOSIS — E1169 Type 2 diabetes mellitus with other specified complication: Secondary | ICD-10-CM | POA: Diagnosis not present

## 2021-09-30 DIAGNOSIS — I129 Hypertensive chronic kidney disease with stage 1 through stage 4 chronic kidney disease, or unspecified chronic kidney disease: Secondary | ICD-10-CM | POA: Diagnosis not present

## 2021-10-03 DIAGNOSIS — Z86711 Personal history of pulmonary embolism: Secondary | ICD-10-CM | POA: Diagnosis not present

## 2021-10-03 DIAGNOSIS — Z87440 Personal history of urinary (tract) infections: Secondary | ICD-10-CM | POA: Diagnosis not present

## 2021-10-03 DIAGNOSIS — E7800 Pure hypercholesterolemia, unspecified: Secondary | ICD-10-CM | POA: Diagnosis not present

## 2021-10-03 DIAGNOSIS — E119 Type 2 diabetes mellitus without complications: Secondary | ICD-10-CM | POA: Diagnosis not present

## 2021-10-03 DIAGNOSIS — Z96651 Presence of right artificial knee joint: Secondary | ICD-10-CM | POA: Diagnosis not present

## 2021-10-03 DIAGNOSIS — Z7982 Long term (current) use of aspirin: Secondary | ICD-10-CM | POA: Diagnosis not present

## 2021-10-03 DIAGNOSIS — Z9181 History of falling: Secondary | ICD-10-CM | POA: Diagnosis not present

## 2021-10-03 DIAGNOSIS — I110 Hypertensive heart disease with heart failure: Secondary | ICD-10-CM | POA: Diagnosis not present

## 2021-10-03 DIAGNOSIS — Z96653 Presence of artificial knee joint, bilateral: Secondary | ICD-10-CM | POA: Diagnosis not present

## 2021-10-03 DIAGNOSIS — Z7984 Long term (current) use of oral hypoglycemic drugs: Secondary | ICD-10-CM | POA: Diagnosis not present

## 2021-10-03 DIAGNOSIS — M1990 Unspecified osteoarthritis, unspecified site: Secondary | ICD-10-CM | POA: Diagnosis not present

## 2021-10-03 DIAGNOSIS — I509 Heart failure, unspecified: Secondary | ICD-10-CM | POA: Diagnosis not present

## 2021-10-03 DIAGNOSIS — Z8616 Personal history of COVID-19: Secondary | ICD-10-CM | POA: Diagnosis not present

## 2021-10-03 DIAGNOSIS — J449 Chronic obstructive pulmonary disease, unspecified: Secondary | ICD-10-CM | POA: Diagnosis not present

## 2021-10-05 ENCOUNTER — Encounter: Payer: Self-pay | Admitting: *Deleted

## 2021-10-05 ENCOUNTER — Encounter: Payer: Self-pay | Admitting: Cardiology

## 2021-10-05 DIAGNOSIS — Z96651 Presence of right artificial knee joint: Secondary | ICD-10-CM | POA: Diagnosis not present

## 2021-10-05 DIAGNOSIS — Z87440 Personal history of urinary (tract) infections: Secondary | ICD-10-CM | POA: Diagnosis not present

## 2021-10-05 DIAGNOSIS — I350 Nonrheumatic aortic (valve) stenosis: Secondary | ICD-10-CM | POA: Insufficient documentation

## 2021-10-05 DIAGNOSIS — Z9181 History of falling: Secondary | ICD-10-CM | POA: Diagnosis not present

## 2021-10-05 DIAGNOSIS — I5030 Unspecified diastolic (congestive) heart failure: Secondary | ICD-10-CM | POA: Insufficient documentation

## 2021-10-05 DIAGNOSIS — Z7984 Long term (current) use of oral hypoglycemic drugs: Secondary | ICD-10-CM | POA: Diagnosis not present

## 2021-10-05 DIAGNOSIS — J449 Chronic obstructive pulmonary disease, unspecified: Secondary | ICD-10-CM | POA: Diagnosis not present

## 2021-10-05 DIAGNOSIS — Z7982 Long term (current) use of aspirin: Secondary | ICD-10-CM | POA: Diagnosis not present

## 2021-10-05 DIAGNOSIS — Z96653 Presence of artificial knee joint, bilateral: Secondary | ICD-10-CM | POA: Diagnosis not present

## 2021-10-05 DIAGNOSIS — I509 Heart failure, unspecified: Secondary | ICD-10-CM | POA: Diagnosis not present

## 2021-10-05 DIAGNOSIS — I110 Hypertensive heart disease with heart failure: Secondary | ICD-10-CM | POA: Diagnosis not present

## 2021-10-05 DIAGNOSIS — I129 Hypertensive chronic kidney disease with stage 1 through stage 4 chronic kidney disease, or unspecified chronic kidney disease: Secondary | ICD-10-CM | POA: Insufficient documentation

## 2021-10-05 DIAGNOSIS — E785 Hyperlipidemia, unspecified: Secondary | ICD-10-CM | POA: Insufficient documentation

## 2021-10-05 DIAGNOSIS — E119 Type 2 diabetes mellitus without complications: Secondary | ICD-10-CM | POA: Diagnosis not present

## 2021-10-05 DIAGNOSIS — Z8616 Personal history of COVID-19: Secondary | ICD-10-CM | POA: Diagnosis not present

## 2021-10-05 DIAGNOSIS — E7800 Pure hypercholesterolemia, unspecified: Secondary | ICD-10-CM | POA: Diagnosis not present

## 2021-10-05 DIAGNOSIS — Z86711 Personal history of pulmonary embolism: Secondary | ICD-10-CM | POA: Diagnosis not present

## 2021-10-05 DIAGNOSIS — M1990 Unspecified osteoarthritis, unspecified site: Secondary | ICD-10-CM | POA: Diagnosis not present

## 2021-10-10 DIAGNOSIS — Z8616 Personal history of COVID-19: Secondary | ICD-10-CM | POA: Diagnosis not present

## 2021-10-10 DIAGNOSIS — M1990 Unspecified osteoarthritis, unspecified site: Secondary | ICD-10-CM | POA: Diagnosis not present

## 2021-10-10 DIAGNOSIS — I509 Heart failure, unspecified: Secondary | ICD-10-CM | POA: Diagnosis not present

## 2021-10-10 DIAGNOSIS — Z7984 Long term (current) use of oral hypoglycemic drugs: Secondary | ICD-10-CM | POA: Diagnosis not present

## 2021-10-10 DIAGNOSIS — Z9181 History of falling: Secondary | ICD-10-CM | POA: Diagnosis not present

## 2021-10-10 DIAGNOSIS — J449 Chronic obstructive pulmonary disease, unspecified: Secondary | ICD-10-CM | POA: Diagnosis not present

## 2021-10-10 DIAGNOSIS — J45909 Unspecified asthma, uncomplicated: Secondary | ICD-10-CM | POA: Diagnosis not present

## 2021-10-10 DIAGNOSIS — I110 Hypertensive heart disease with heart failure: Secondary | ICD-10-CM | POA: Diagnosis not present

## 2021-10-10 DIAGNOSIS — Z86711 Personal history of pulmonary embolism: Secondary | ICD-10-CM | POA: Diagnosis not present

## 2021-10-10 DIAGNOSIS — E119 Type 2 diabetes mellitus without complications: Secondary | ICD-10-CM | POA: Diagnosis not present

## 2021-10-10 DIAGNOSIS — Z96653 Presence of artificial knee joint, bilateral: Secondary | ICD-10-CM | POA: Diagnosis not present

## 2021-10-10 DIAGNOSIS — Z96651 Presence of right artificial knee joint: Secondary | ICD-10-CM | POA: Diagnosis not present

## 2021-10-10 DIAGNOSIS — Z87440 Personal history of urinary (tract) infections: Secondary | ICD-10-CM | POA: Diagnosis not present

## 2021-10-10 DIAGNOSIS — Z7982 Long term (current) use of aspirin: Secondary | ICD-10-CM | POA: Diagnosis not present

## 2021-10-10 DIAGNOSIS — E7800 Pure hypercholesterolemia, unspecified: Secondary | ICD-10-CM | POA: Diagnosis not present

## 2021-10-11 DIAGNOSIS — M1990 Unspecified osteoarthritis, unspecified site: Secondary | ICD-10-CM | POA: Diagnosis not present

## 2021-10-11 DIAGNOSIS — E7800 Pure hypercholesterolemia, unspecified: Secondary | ICD-10-CM | POA: Diagnosis not present

## 2021-10-11 DIAGNOSIS — I110 Hypertensive heart disease with heart failure: Secondary | ICD-10-CM | POA: Diagnosis not present

## 2021-10-11 DIAGNOSIS — E119 Type 2 diabetes mellitus without complications: Secondary | ICD-10-CM | POA: Diagnosis not present

## 2021-10-11 DIAGNOSIS — Z7982 Long term (current) use of aspirin: Secondary | ICD-10-CM | POA: Diagnosis not present

## 2021-10-11 DIAGNOSIS — Z86711 Personal history of pulmonary embolism: Secondary | ICD-10-CM | POA: Diagnosis not present

## 2021-10-11 DIAGNOSIS — Z87440 Personal history of urinary (tract) infections: Secondary | ICD-10-CM | POA: Diagnosis not present

## 2021-10-11 DIAGNOSIS — J449 Chronic obstructive pulmonary disease, unspecified: Secondary | ICD-10-CM | POA: Diagnosis not present

## 2021-10-11 DIAGNOSIS — Z8616 Personal history of COVID-19: Secondary | ICD-10-CM | POA: Diagnosis not present

## 2021-10-11 DIAGNOSIS — Z7984 Long term (current) use of oral hypoglycemic drugs: Secondary | ICD-10-CM | POA: Diagnosis not present

## 2021-10-11 DIAGNOSIS — I509 Heart failure, unspecified: Secondary | ICD-10-CM | POA: Diagnosis not present

## 2021-10-11 DIAGNOSIS — Z9181 History of falling: Secondary | ICD-10-CM | POA: Diagnosis not present

## 2021-10-11 DIAGNOSIS — Z96651 Presence of right artificial knee joint: Secondary | ICD-10-CM | POA: Diagnosis not present

## 2021-10-11 DIAGNOSIS — Z96653 Presence of artificial knee joint, bilateral: Secondary | ICD-10-CM | POA: Diagnosis not present

## 2021-10-18 DIAGNOSIS — Z87440 Personal history of urinary (tract) infections: Secondary | ICD-10-CM | POA: Diagnosis not present

## 2021-10-18 DIAGNOSIS — J449 Chronic obstructive pulmonary disease, unspecified: Secondary | ICD-10-CM | POA: Diagnosis not present

## 2021-10-18 DIAGNOSIS — E119 Type 2 diabetes mellitus without complications: Secondary | ICD-10-CM | POA: Diagnosis not present

## 2021-10-18 DIAGNOSIS — I110 Hypertensive heart disease with heart failure: Secondary | ICD-10-CM | POA: Diagnosis not present

## 2021-10-18 DIAGNOSIS — E7800 Pure hypercholesterolemia, unspecified: Secondary | ICD-10-CM | POA: Diagnosis not present

## 2021-10-18 DIAGNOSIS — Z96653 Presence of artificial knee joint, bilateral: Secondary | ICD-10-CM | POA: Diagnosis not present

## 2021-10-18 DIAGNOSIS — Z9181 History of falling: Secondary | ICD-10-CM | POA: Diagnosis not present

## 2021-10-18 DIAGNOSIS — Z8616 Personal history of COVID-19: Secondary | ICD-10-CM | POA: Diagnosis not present

## 2021-10-18 DIAGNOSIS — M1990 Unspecified osteoarthritis, unspecified site: Secondary | ICD-10-CM | POA: Diagnosis not present

## 2021-10-18 DIAGNOSIS — Z7984 Long term (current) use of oral hypoglycemic drugs: Secondary | ICD-10-CM | POA: Diagnosis not present

## 2021-10-18 DIAGNOSIS — I509 Heart failure, unspecified: Secondary | ICD-10-CM | POA: Diagnosis not present

## 2021-10-18 DIAGNOSIS — Z7982 Long term (current) use of aspirin: Secondary | ICD-10-CM | POA: Diagnosis not present

## 2021-10-18 DIAGNOSIS — Z86711 Personal history of pulmonary embolism: Secondary | ICD-10-CM | POA: Diagnosis not present

## 2021-10-18 DIAGNOSIS — Z96651 Presence of right artificial knee joint: Secondary | ICD-10-CM | POA: Diagnosis not present

## 2021-10-23 DIAGNOSIS — J449 Chronic obstructive pulmonary disease, unspecified: Secondary | ICD-10-CM | POA: Diagnosis not present

## 2021-10-23 DIAGNOSIS — Z96653 Presence of artificial knee joint, bilateral: Secondary | ICD-10-CM | POA: Diagnosis not present

## 2021-10-23 DIAGNOSIS — Z87440 Personal history of urinary (tract) infections: Secondary | ICD-10-CM | POA: Diagnosis not present

## 2021-10-23 DIAGNOSIS — Z9181 History of falling: Secondary | ICD-10-CM | POA: Diagnosis not present

## 2021-10-23 DIAGNOSIS — Z7982 Long term (current) use of aspirin: Secondary | ICD-10-CM | POA: Diagnosis not present

## 2021-10-23 DIAGNOSIS — E119 Type 2 diabetes mellitus without complications: Secondary | ICD-10-CM | POA: Diagnosis not present

## 2021-10-23 DIAGNOSIS — Z86711 Personal history of pulmonary embolism: Secondary | ICD-10-CM | POA: Diagnosis not present

## 2021-10-23 DIAGNOSIS — E7800 Pure hypercholesterolemia, unspecified: Secondary | ICD-10-CM | POA: Diagnosis not present

## 2021-10-23 DIAGNOSIS — Z96651 Presence of right artificial knee joint: Secondary | ICD-10-CM | POA: Diagnosis not present

## 2021-10-23 DIAGNOSIS — Z7984 Long term (current) use of oral hypoglycemic drugs: Secondary | ICD-10-CM | POA: Diagnosis not present

## 2021-10-23 DIAGNOSIS — M1990 Unspecified osteoarthritis, unspecified site: Secondary | ICD-10-CM | POA: Diagnosis not present

## 2021-10-23 DIAGNOSIS — I509 Heart failure, unspecified: Secondary | ICD-10-CM | POA: Diagnosis not present

## 2021-10-23 DIAGNOSIS — Z8616 Personal history of COVID-19: Secondary | ICD-10-CM | POA: Diagnosis not present

## 2021-10-23 DIAGNOSIS — I110 Hypertensive heart disease with heart failure: Secondary | ICD-10-CM | POA: Diagnosis not present

## 2021-10-24 DIAGNOSIS — Z96653 Presence of artificial knee joint, bilateral: Secondary | ICD-10-CM | POA: Diagnosis not present

## 2021-10-24 DIAGNOSIS — Z96651 Presence of right artificial knee joint: Secondary | ICD-10-CM | POA: Diagnosis not present

## 2021-10-24 DIAGNOSIS — E119 Type 2 diabetes mellitus without complications: Secondary | ICD-10-CM | POA: Diagnosis not present

## 2021-10-24 DIAGNOSIS — I110 Hypertensive heart disease with heart failure: Secondary | ICD-10-CM | POA: Diagnosis not present

## 2021-10-24 DIAGNOSIS — Z86711 Personal history of pulmonary embolism: Secondary | ICD-10-CM | POA: Diagnosis not present

## 2021-10-24 DIAGNOSIS — Z9181 History of falling: Secondary | ICD-10-CM | POA: Diagnosis not present

## 2021-10-24 DIAGNOSIS — Z8616 Personal history of COVID-19: Secondary | ICD-10-CM | POA: Diagnosis not present

## 2021-10-24 DIAGNOSIS — Z7984 Long term (current) use of oral hypoglycemic drugs: Secondary | ICD-10-CM | POA: Diagnosis not present

## 2021-10-24 DIAGNOSIS — Z87440 Personal history of urinary (tract) infections: Secondary | ICD-10-CM | POA: Diagnosis not present

## 2021-10-24 DIAGNOSIS — Z7982 Long term (current) use of aspirin: Secondary | ICD-10-CM | POA: Diagnosis not present

## 2021-10-24 DIAGNOSIS — I509 Heart failure, unspecified: Secondary | ICD-10-CM | POA: Diagnosis not present

## 2021-10-24 DIAGNOSIS — E7800 Pure hypercholesterolemia, unspecified: Secondary | ICD-10-CM | POA: Diagnosis not present

## 2021-10-24 DIAGNOSIS — M1990 Unspecified osteoarthritis, unspecified site: Secondary | ICD-10-CM | POA: Diagnosis not present

## 2021-10-24 DIAGNOSIS — J449 Chronic obstructive pulmonary disease, unspecified: Secondary | ICD-10-CM | POA: Diagnosis not present

## 2021-10-25 DIAGNOSIS — E782 Mixed hyperlipidemia: Secondary | ICD-10-CM | POA: Diagnosis not present

## 2021-10-25 DIAGNOSIS — N1831 Chronic kidney disease, stage 3a: Secondary | ICD-10-CM | POA: Diagnosis not present

## 2021-10-25 DIAGNOSIS — I5030 Unspecified diastolic (congestive) heart failure: Secondary | ICD-10-CM | POA: Diagnosis not present

## 2021-10-25 DIAGNOSIS — E1169 Type 2 diabetes mellitus with other specified complication: Secondary | ICD-10-CM | POA: Diagnosis not present

## 2021-10-29 DIAGNOSIS — M1990 Unspecified osteoarthritis, unspecified site: Secondary | ICD-10-CM | POA: Diagnosis not present

## 2021-10-29 DIAGNOSIS — Z7982 Long term (current) use of aspirin: Secondary | ICD-10-CM | POA: Diagnosis not present

## 2021-10-29 DIAGNOSIS — J449 Chronic obstructive pulmonary disease, unspecified: Secondary | ICD-10-CM | POA: Diagnosis not present

## 2021-10-29 DIAGNOSIS — I110 Hypertensive heart disease with heart failure: Secondary | ICD-10-CM | POA: Diagnosis not present

## 2021-10-29 DIAGNOSIS — Z96651 Presence of right artificial knee joint: Secondary | ICD-10-CM | POA: Diagnosis not present

## 2021-10-29 DIAGNOSIS — E119 Type 2 diabetes mellitus without complications: Secondary | ICD-10-CM | POA: Diagnosis not present

## 2021-10-29 DIAGNOSIS — I509 Heart failure, unspecified: Secondary | ICD-10-CM | POA: Diagnosis not present

## 2021-10-29 DIAGNOSIS — Z8616 Personal history of COVID-19: Secondary | ICD-10-CM | POA: Diagnosis not present

## 2021-10-29 DIAGNOSIS — Z87440 Personal history of urinary (tract) infections: Secondary | ICD-10-CM | POA: Diagnosis not present

## 2021-10-29 DIAGNOSIS — Z96653 Presence of artificial knee joint, bilateral: Secondary | ICD-10-CM | POA: Diagnosis not present

## 2021-10-29 DIAGNOSIS — Z7984 Long term (current) use of oral hypoglycemic drugs: Secondary | ICD-10-CM | POA: Diagnosis not present

## 2021-10-29 DIAGNOSIS — Z9181 History of falling: Secondary | ICD-10-CM | POA: Diagnosis not present

## 2021-10-29 DIAGNOSIS — E7800 Pure hypercholesterolemia, unspecified: Secondary | ICD-10-CM | POA: Diagnosis not present

## 2021-10-29 DIAGNOSIS — Z86711 Personal history of pulmonary embolism: Secondary | ICD-10-CM | POA: Diagnosis not present

## 2021-10-30 DIAGNOSIS — Z7984 Long term (current) use of oral hypoglycemic drugs: Secondary | ICD-10-CM | POA: Diagnosis not present

## 2021-10-30 DIAGNOSIS — Z7982 Long term (current) use of aspirin: Secondary | ICD-10-CM | POA: Diagnosis not present

## 2021-10-30 DIAGNOSIS — I110 Hypertensive heart disease with heart failure: Secondary | ICD-10-CM | POA: Diagnosis not present

## 2021-10-30 DIAGNOSIS — J449 Chronic obstructive pulmonary disease, unspecified: Secondary | ICD-10-CM | POA: Diagnosis not present

## 2021-10-30 DIAGNOSIS — E119 Type 2 diabetes mellitus without complications: Secondary | ICD-10-CM | POA: Diagnosis not present

## 2021-10-30 DIAGNOSIS — Z86711 Personal history of pulmonary embolism: Secondary | ICD-10-CM | POA: Diagnosis not present

## 2021-10-30 DIAGNOSIS — Z8616 Personal history of COVID-19: Secondary | ICD-10-CM | POA: Diagnosis not present

## 2021-10-30 DIAGNOSIS — Z96651 Presence of right artificial knee joint: Secondary | ICD-10-CM | POA: Diagnosis not present

## 2021-10-30 DIAGNOSIS — Z87440 Personal history of urinary (tract) infections: Secondary | ICD-10-CM | POA: Diagnosis not present

## 2021-10-30 DIAGNOSIS — E7800 Pure hypercholesterolemia, unspecified: Secondary | ICD-10-CM | POA: Diagnosis not present

## 2021-10-30 DIAGNOSIS — I509 Heart failure, unspecified: Secondary | ICD-10-CM | POA: Diagnosis not present

## 2021-10-30 DIAGNOSIS — Z96653 Presence of artificial knee joint, bilateral: Secondary | ICD-10-CM | POA: Diagnosis not present

## 2021-10-30 DIAGNOSIS — M1990 Unspecified osteoarthritis, unspecified site: Secondary | ICD-10-CM | POA: Diagnosis not present

## 2021-10-30 DIAGNOSIS — Z9181 History of falling: Secondary | ICD-10-CM | POA: Diagnosis not present

## 2021-10-31 DIAGNOSIS — I129 Hypertensive chronic kidney disease with stage 1 through stage 4 chronic kidney disease, or unspecified chronic kidney disease: Secondary | ICD-10-CM | POA: Diagnosis not present

## 2021-10-31 DIAGNOSIS — E1169 Type 2 diabetes mellitus with other specified complication: Secondary | ICD-10-CM | POA: Diagnosis not present

## 2021-11-02 DIAGNOSIS — E1169 Type 2 diabetes mellitus with other specified complication: Secondary | ICD-10-CM | POA: Insufficient documentation

## 2021-11-02 DIAGNOSIS — M1990 Unspecified osteoarthritis, unspecified site: Secondary | ICD-10-CM | POA: Insufficient documentation

## 2021-11-02 DIAGNOSIS — J349 Unspecified disorder of nose and nasal sinuses: Secondary | ICD-10-CM | POA: Insufficient documentation

## 2021-11-02 DIAGNOSIS — Z853 Personal history of malignant neoplasm of breast: Secondary | ICD-10-CM | POA: Insufficient documentation

## 2021-11-02 DIAGNOSIS — R609 Edema, unspecified: Secondary | ICD-10-CM | POA: Insufficient documentation

## 2021-11-02 DIAGNOSIS — H9190 Unspecified hearing loss, unspecified ear: Secondary | ICD-10-CM | POA: Insufficient documentation

## 2021-11-02 DIAGNOSIS — I050 Rheumatic mitral stenosis: Secondary | ICD-10-CM | POA: Insufficient documentation

## 2021-11-02 DIAGNOSIS — I10 Essential (primary) hypertension: Secondary | ICD-10-CM | POA: Insufficient documentation

## 2021-11-02 DIAGNOSIS — E782 Mixed hyperlipidemia: Secondary | ICD-10-CM | POA: Insufficient documentation

## 2021-11-02 DIAGNOSIS — J45909 Unspecified asthma, uncomplicated: Secondary | ICD-10-CM | POA: Insufficient documentation

## 2021-11-02 DIAGNOSIS — E039 Hypothyroidism, unspecified: Secondary | ICD-10-CM | POA: Insufficient documentation

## 2021-11-02 DIAGNOSIS — E1142 Type 2 diabetes mellitus with diabetic polyneuropathy: Secondary | ICD-10-CM | POA: Insufficient documentation

## 2021-11-02 DIAGNOSIS — M791 Myalgia, unspecified site: Secondary | ICD-10-CM | POA: Insufficient documentation

## 2021-11-04 NOTE — Progress Notes (Signed)
Cardiology Office Note:    Date:  11/05/2021   ID:  [name] [name], [place] Aug 08, 1941, MRN [id number]  PCP:  Marco Collie, MD  Cardiologist:  Shirlee More, MD   Referring MD: Marco Collie, MD  ASSESSMENT:    1. Hypertensive heart disease with heart failure ([place])   2. Nonrheumatic aortic valve stenosis   3. Nonrheumatic mitral valve stenosis   4. Type 2 diabetes mellitus with other specified complication, without long-term current use of insulin ([place])   5. Hyperlipidemia, unspecified hyperlipidemia type   6. Chronic obstructive pulmonary disease, unspecified COPD type ([place])    PLAN:    In order of problems listed above:  In retrospect she may have been sent home from the hospital before she cleared her edema she remains fluid overloaded and I will increase her diuretic take an extra tablet furosemide every other day.  She will continue daily potassium daily she was hypokalemic in the hospital and has arrangements for follow-up labs next month PCP office.  Her blood pressure is borderline low did not take her ACE inhibitor today and I discontinued as hypotension and peripheral vasodilators can exacerbate the gradient and aortic stenosis.  Very good opportunity to discuss daily weights and sodium restriction.  Fortunately she uses a pillbox at home.  Although there is really no literature on using SGLT2 inhibitors and aortic stenosis then gets a good addition we will continue with her.  She also takes low-dose gabapentin that favors sodium retention. Her aortic stenosis and mitral stenosis are not severe recheck echo 6 months prior to her next visit Continue statin Stable she uses ambulatory oxygen  Next appointment 6 months   Medication Adjustments/Labs and Tests Ordered: Current medicines are reviewed at length with the patient today.  Concerns regarding medicines are outlined above.  No orders of the defined types were placed in this encounter.  No orders of the defined  types were placed in this encounter.    Chief Complaint  Patient presents with   Congestive Heart Failure   Aortic Stenosis   Mitral Stenosis    History of Present Illness:    [name] [name] is a 81 y.o. female who is being seen today for the evaluation of heart failure and aortic stenosis at the request of Marco Collie, MD.  She is admitted to [facility] health 12/9 to 09/08/2021 with shortness of breath and elevated D-dimer as an outpatient.  CTA of the chest showed no findings of pulmonary embolism she had coronary artery calcification.  Chest x-ray showed findings of COPD.  Laboratory studies showed elevated troponin flat curve not acute coronary syndrome peak 0.12 BNP was significantly elevated 2080 hemoglobin 13.5 creatinine 0.9 she was hypokalemic potassium 3.2 was admitted given IV diuretics with a notation that she was improved and discharged.  While in hospital echocardiogram showed findings of hypertensive heart disease of moderate LVH severe left atrial enlargement normal ejection fraction.  She also had degenerative valvular disease with moderate aortic stenosis peak and mean gradients 50 and 28 mmHg valve area 1.6 cm severe calcific mitral annular disease and moderate mitral stenosis.  He was previously seen by cardiology Dr. Fletcher Anon in 2011 for hypertension and hypertensive heart disease and hyperlipidemia.  Since discharge she has done better but still has edema sleeps on 3 pillows has to stop and rest with ADLs and can walk as far as her mailbox about 62ft.  She has no cough or wheezing but has a history of COPD she  is still adding salt to her diet weighs daily and her weights vary from 1 7 4  to 178 pounds.  She has had no angina palpitations or syncope.  She was unaware that she had degenerative valvular heart disease.  She thinks she has had a much more prompt response to the diuretic since she started taking an SGLT 2 reviewed better.  She is not tracking blood  pressure at home. Past Medical History:  Diagnosis Date   Aortic valve stenosis, moderate    Arthritis    Asthma    Benign hypertension with CKD (chronic kidney disease) stage III (HCC)    COPD (chronic obstructive pulmonary disease) (HCC)    Diabetic peripheral neuropathy (HCC)    Diastolic congestive heart failure ([place])    DM type 2 with diabetic mixed hyperlipidemia (HCC)    Hearing loss    History of breast cancer    HLD (hyperlipidemia)    HTN (hypertension)    Hypothyroid    Intraductal carcinoma in situ of right breast 04/27/2014   Mitral valve stenosis    Morbid obesity (HCC)    Muscle pain    Sinus problem    Swelling     Past Surgical History:  Procedure Laterality Date   BACK SURGERY  02/14/2014   MASTECTOMY Right 2015   PARTIAL HYSTERECTOMY  1980   TONSILLECTOMY     TOTAL KNEE ARTHROPLASTY Right 02/07/2011    Current Medications: Current Meds  Medication Sig   albuterol (VENTOLIN HFA) 108 (90 Base) MCG/ACT inhaler Inhale 2 puffs into the lungs every 6 (six) hours as needed for wheezing or shortness of breath.   calcium carbonate (OSCAL) 1500 (600 Ca) MG TABS tablet Take 1 tablet by mouth daily with breakfast.   cholecalciferol (VITAMIN D3) 25 MCG (1000 UNIT) tablet Take 1,000 Units by mouth daily.   FARXIGA 10 MG TABS tablet Take 10 mg by mouth daily.   Fluticasone-Umeclidin-Vilant (TRELEGY ELLIPTA) 100-62.5-25 MCG/ACT AEPB Inhale 1 puff into the lungs daily.   furosemide (LASIX) 40 MG tablet Take 40 mg by mouth daily.   gabapentin (NEURONTIN) 100 MG capsule Take 100 mg by mouth 2 (two) times daily.   guaiFENesin (MUCINEX) 600 MG 12 hr tablet Take 600 mg by mouth 2 (two) times daily as needed for cough or to loosen phlegm.   levothyroxine (SYNTHROID) 88 MCG tablet Take 88 mcg by mouth daily before breakfast.   lisinopril-hydrochlorothiazide (PRINZIDE,ZESTORETIC) 20-12.5 MG per tablet Take 0.5 tablets by mouth daily. 1/2 tablet daily   Magnesium 250 MG TABS  Take 250 mg by mouth daily.   pravastatin (PRAVACHOL) 40 MG tablet Take 40 mg by mouth daily.     Allergies:   Darvon [propoxyphene] and Penicillins   Social History   Socioeconomic History   Marital status: Widowed    Spouse name: Not on file   Number of children: 3   Years of education: Not on file   Highest education level: Not on file  Occupational History   Occupation: bakery    Employer: [id number]  Tobacco Use   Smoking status: Never    Passive exposure: Never   Smokeless tobacco: Never  Vaping Use   Vaping Use: Never used  Substance and Sexual Activity   Alcohol use: Yes    Comment: Minimal amount   Drug use: No   Sexual activity: Not on file  Other Topics Concern   Not on file  Social History Narrative   Not on file   Social  Determinants of Health   Financial Resource Strain: Not on file  Food Insecurity: Not on file  Transportation Needs: Not on file  Physical Activity: Not on file  Stress: Not on file  Social Connections: Not on file     Family History: The patient's family history includes Alzheimer's disease in an other family member; Breast cancer in her sister; Diabetes Mellitus I in her mother; Heart disease in her mother.  ROS:   ROS Please see the history of present illness.     All other systems reviewed and are negative.  EKGs/Labs/Other Studies Reviewed:    The following studies were reviewed today:    Recent Labs: 10/25/2021 creatinine 1.2 potassium 4.7 Lipid profile 08/28/2021 cholesterol 168 LDL 105 triglycerides 132 HDL 39  Physical Exam:    VS:  BP 104/62 (BP Location: Left Arm)    Pulse 74    Ht 5\' 4"  (1.626 m)    Wt 221 lb 3.2 oz (100.3 kg)    SpO2 97%    BMI 37.97 kg/m     Wt Readings from Last 3 Encounters:  11/05/21 221 lb 3.2 oz (100.3 kg)  09/13/21 223 lb (101.2 kg)  12/15/20 209 lb 9.6 oz (95.1 kg)     GEN:  Well nourished, well developed in no acute distress HEENT: Normal NECK: Mild JVD; No carotid  bruits LYMPHATICS: No lymphadenopathy CARDIAC: Grade 2/6 to 3/6 AAS murmur S2 is single but does not radiate to the carotids RRR, no murmurs, rubs, gallops RESPIRATORY:  Clear to auscultation without rales, wheezing or rhonchi  ABDOMEN: Soft, non-tender, non-distended MUSCULOSKELETAL: 2-3+ toes to knees bilateral pitting edema edema; No deformity  SKIN: Warm and dry NEUROLOGIC:  Alert and oriented x 3 PSYCHIATRIC:  Normal affect     Signed, Shirlee More, MD  11/05/2021 2:36 PM    [facility]

## 2021-11-05 ENCOUNTER — Encounter: Payer: Self-pay | Admitting: Cardiology

## 2021-11-05 ENCOUNTER — Other Ambulatory Visit: Payer: Self-pay

## 2021-11-05 ENCOUNTER — Ambulatory Visit: Payer: Medicare Other | Admitting: Cardiology

## 2021-11-05 VITALS — BP 104/62 | HR 74 | Ht 64.0 in | Wt 221.2 lb

## 2021-11-05 DIAGNOSIS — I350 Nonrheumatic aortic (valve) stenosis: Secondary | ICD-10-CM

## 2021-11-05 DIAGNOSIS — I342 Nonrheumatic mitral (valve) stenosis: Secondary | ICD-10-CM

## 2021-11-05 DIAGNOSIS — I110 Hypertensive heart disease with heart failure: Secondary | ICD-10-CM

## 2021-11-05 DIAGNOSIS — J449 Chronic obstructive pulmonary disease, unspecified: Secondary | ICD-10-CM

## 2021-11-05 DIAGNOSIS — E785 Hyperlipidemia, unspecified: Secondary | ICD-10-CM

## 2021-11-05 DIAGNOSIS — E1169 Type 2 diabetes mellitus with other specified complication: Secondary | ICD-10-CM | POA: Diagnosis not present

## 2021-11-05 MED ORDER — FUROSEMIDE 40 MG PO TABS: ORAL_TABLET | ORAL | 3 refills | Status: DC

## 2021-11-05 MED ORDER — POTASSIUM CHLORIDE ER 10 MEQ PO TBCR: 10.0000 meq | EXTENDED_RELEASE_TABLET | Freq: Every day | ORAL | 3 refills | Status: DC

## 2021-11-05 NOTE — Patient Instructions (Signed)
Medication Instructions:  Your physician has recommended you make the following change in your medication: TAKE EXTRA FUROSEMIDE EVERY OTHER DAY AT NOON DISCONTINUE LISINOPRIL HCTZ START POTASSIUM 10 MEQ ONCE DAILY  *If you need a refill on your cardiac medications before your next appointment, please call your pharmacy*   Lab Work: NONE If you have labs (blood work) drawn today and your tests are completely normal, you will receive your results only by: [place] (if you have MyChart) OR A paper copy in the mail If you have any lab test that is abnormal or we need to change your treatment, we will call you to review the results.   Testing/Procedures: Your physician has requested that you have an echocardiogram. Echocardiography is a painless test that uses sound waves to create images of your heart. It provides your doctor with information about the size and shape of your heart and how well your hearts chambers and valves are working. This procedure takes approximately one hour. There are no restrictions for this procedure.   Other Instructions     Follow-Up: At [facility], you and your health needs are our priority.  As part of our continuing mission to provide you with exceptional heart care, we have created designated Provider Care Teams.  These Care Teams include your primary Cardiologist (physician) and Advanced Practice Providers (APPs -  Physician Assistants and Nurse Practitioners) who all work together to provide you with the care you need, when you need it.  We recommend signing up for the patient portal called "MyChart".  Sign up information is provided on this After Visit Summary.  MyChart is used to connect with patients for Virtual Visits (Telemedicine).  Patients are able to view lab/test results, encounter notes, upcoming appointments, etc.  Non-urgent messages can be sent to your provider as well.   To learn more about what you can do with MyChart, go to  [email or web address].    Your next appointment:   6 month(s)  The format for your next appointment:   In Person  Provider:   Shirlee More, MD    Other Instructions  Heart Failure  Weigh yourself every morning when you first wake up and record on a calender or note pad, bring this to your office visits. Using a pill tender can help with taking your medications consistently.  Limit your fluid intake to 2 liters daily  Limit your sodium intake to less than 2-3 grams daily. Ask if you need dietary teaching.  If you gain more than 3 pounds (from your dry weight ), double your dose of diuretic for the day.  If you gain more than 5 pounds (from your dry weight), double your dose of lasix and call your heart failure doctor.  Please do not smoke tobacco since it is very bad for your heart.  Please do not drink alcohol since it can worsen your heart failure.Also avoid OTC nonsteroidal drugs, such as advil, aleve and motrin.  Try to exercise for at least 30 minutes every day because this will help your heart be more efficient. You may be eligible for supervised cardiac rehab, ask your physician.    DASH Diet: Care Instructions Your Care Instructions The DASH diet is an eating plan that can help lower your blood pressure. DASH stands for Dietary Approaches to Stop Hypertension. Hypertension is high blood pressure. The DASH diet focuses on eating foods that are high in calcium, potassium, and magnesium. These nutrients can lower blood pressure. The foods that are  highest in these nutrients are fruits, vegetables, low-fat dairy products, nuts, seeds, and legumes. But taking calcium, potassium, and magnesium supplements instead of eating foods that are high in those nutrients does not have the same effect. The DASH diet also includes whole grains, fish, and poultry. The DASH diet is one of several lifestyle changes your doctor may recommend to lower your high blood pressure. Your doctor may  also want you to decrease the amount of sodium in your diet. Lowering sodium while following the DASH diet can lower blood pressure even further than just the DASH diet alone. Follow-up care is a key part of your treatment and safety. Be sure to make and go to all appointments, and call your doctor if you are having problems. It's also a good idea to know your test results and keep a list of the medicines you take. How can you care for yourself at home? Following the DASH diet  Eat 4 to 5 servings of fruit each day. A serving is 1 medium-sized piece of fruit,  cup chopped or canned fruit, 1/4 cup dried fruit, or 4 ounces ( cup) of fruit juice. Choose fruit more often than fruit juice.  Eat 4 to 5 servings of vegetables each day. A serving is 1 cup of lettuce or raw leafy vegetables,  cup of chopped or cooked vegetables, or 4 ounces ( cup) of vegetable juice. Choose vegetables more often than vegetable juice.  Get 2 to 3 servings of low-fat and fat-free dairy each day. A serving is 8 ounces of milk, 1 cup of yogurt, or 1  ounces of cheese.  Eat 6 to 8 servings of grains each day. A serving is 1 slice of bread, 1 ounce of dry cereal, or  cup of cooked rice, pasta, or cooked cereal. Try to choose whole-grain products as much as possible.  Limit lean meat, poultry, and fish to 2 servings each day. A serving is 3 ounces, about the size of a deck of cards.  Eat 4 to 5 servings of nuts, seeds, and legumes (cooked dried beans, lentils, and split peas) each week. A serving is 1/3 cup of nuts, 2 tablespoons of seeds, or  cup of cooked beans or peas.  Limit fats and oils to 2 to 3 servings each day. A serving is 1 teaspoon of vegetable oil or 2 tablespoons of salad dressing.  Limit sweets and added sugars to 5 servings or less a week. A serving is 1 tablespoon jelly or jam,  cup sorbet, or 1 cup of lemonade.  Eat less than 2,300 milligrams (mg) of sodium a day. If you limit your sodium to 1,500 mg  a day, you can lower your blood pressure even more. Tips for success  Start small. Do not try to make dramatic changes to your diet all at once. You might feel that you are missing out on your favorite foods and then be more likely to not follow the plan. Make small changes, and stick with them. Once those changes become habit, add a few more changes.  Try some of the following:  Make it a goal to eat a fruit or vegetable at every meal and at snacks. This will make it easy to get the recommended amount of fruits and vegetables each day.  Try yogurt topped with fruit and nuts for a snack or healthy dessert.  Add lettuce, tomato, cucumber, and onion to sandwiches.  Combine a ready-made pizza crust with low-fat mozzarella cheese and lots of  vegetable toppings. Try using tomatoes, squash, spinach, broccoli, carrots, cauliflower, and onions.  Have a variety of cut-up vegetables with a low-fat dip as an appetizer instead of chips and dip.  Sprinkle sunflower seeds or chopped almonds over salads. Or try adding chopped walnuts or almonds to cooked vegetables.  Try some vegetarian meals using beans and peas. Add garbanzo or kidney beans to salads. Make burritos and tacos with mashed pinto beans or black beans. Where can you learn more? Go to http://www.curry-wood.biz/. Enter [id number] in the search box to learn more about "DASH Diet: Care Instructions." Current as of: December 21, 2014 Content Version: 11.1  2006-2016 [employer]. Care instructions adapted under license by [facility]. If you have questions about a medical condition or this instruction, always ask your healthcare professional. [place] any warranty or liability for your use of this information.

## 2021-11-07 DIAGNOSIS — I509 Heart failure, unspecified: Secondary | ICD-10-CM | POA: Diagnosis not present

## 2021-11-07 DIAGNOSIS — E119 Type 2 diabetes mellitus without complications: Secondary | ICD-10-CM | POA: Diagnosis not present

## 2021-11-07 DIAGNOSIS — I110 Hypertensive heart disease with heart failure: Secondary | ICD-10-CM | POA: Diagnosis not present

## 2021-11-07 DIAGNOSIS — Z96653 Presence of artificial knee joint, bilateral: Secondary | ICD-10-CM | POA: Diagnosis not present

## 2021-11-07 DIAGNOSIS — Z7982 Long term (current) use of aspirin: Secondary | ICD-10-CM | POA: Diagnosis not present

## 2021-11-07 DIAGNOSIS — Z96651 Presence of right artificial knee joint: Secondary | ICD-10-CM | POA: Diagnosis not present

## 2021-11-07 DIAGNOSIS — M1990 Unspecified osteoarthritis, unspecified site: Secondary | ICD-10-CM | POA: Diagnosis not present

## 2021-11-07 DIAGNOSIS — E7800 Pure hypercholesterolemia, unspecified: Secondary | ICD-10-CM | POA: Diagnosis not present

## 2021-11-07 DIAGNOSIS — Z87440 Personal history of urinary (tract) infections: Secondary | ICD-10-CM | POA: Diagnosis not present

## 2021-11-07 DIAGNOSIS — Z86711 Personal history of pulmonary embolism: Secondary | ICD-10-CM | POA: Diagnosis not present

## 2021-11-07 DIAGNOSIS — Z9181 History of falling: Secondary | ICD-10-CM | POA: Diagnosis not present

## 2021-11-07 DIAGNOSIS — Z7984 Long term (current) use of oral hypoglycemic drugs: Secondary | ICD-10-CM | POA: Diagnosis not present

## 2021-11-07 DIAGNOSIS — Z8616 Personal history of COVID-19: Secondary | ICD-10-CM | POA: Diagnosis not present

## 2021-11-07 DIAGNOSIS — J449 Chronic obstructive pulmonary disease, unspecified: Secondary | ICD-10-CM | POA: Diagnosis not present

## 2021-11-08 DIAGNOSIS — E7800 Pure hypercholesterolemia, unspecified: Secondary | ICD-10-CM | POA: Diagnosis not present

## 2021-11-08 DIAGNOSIS — Z7984 Long term (current) use of oral hypoglycemic drugs: Secondary | ICD-10-CM | POA: Diagnosis not present

## 2021-11-08 DIAGNOSIS — I110 Hypertensive heart disease with heart failure: Secondary | ICD-10-CM | POA: Diagnosis not present

## 2021-11-08 DIAGNOSIS — Z96651 Presence of right artificial knee joint: Secondary | ICD-10-CM | POA: Diagnosis not present

## 2021-11-08 DIAGNOSIS — E119 Type 2 diabetes mellitus without complications: Secondary | ICD-10-CM | POA: Diagnosis not present

## 2021-11-08 DIAGNOSIS — Z96653 Presence of artificial knee joint, bilateral: Secondary | ICD-10-CM | POA: Diagnosis not present

## 2021-11-08 DIAGNOSIS — I509 Heart failure, unspecified: Secondary | ICD-10-CM | POA: Diagnosis not present

## 2021-11-08 DIAGNOSIS — Z7982 Long term (current) use of aspirin: Secondary | ICD-10-CM | POA: Diagnosis not present

## 2021-11-08 DIAGNOSIS — Z9181 History of falling: Secondary | ICD-10-CM | POA: Diagnosis not present

## 2021-11-08 DIAGNOSIS — M1990 Unspecified osteoarthritis, unspecified site: Secondary | ICD-10-CM | POA: Diagnosis not present

## 2021-11-08 DIAGNOSIS — Z87440 Personal history of urinary (tract) infections: Secondary | ICD-10-CM | POA: Diagnosis not present

## 2021-11-08 DIAGNOSIS — Z86711 Personal history of pulmonary embolism: Secondary | ICD-10-CM | POA: Diagnosis not present

## 2021-11-08 DIAGNOSIS — J449 Chronic obstructive pulmonary disease, unspecified: Secondary | ICD-10-CM | POA: Diagnosis not present

## 2021-11-08 DIAGNOSIS — Z8616 Personal history of COVID-19: Secondary | ICD-10-CM | POA: Diagnosis not present

## 2021-11-09 ENCOUNTER — Encounter: Payer: Self-pay | Admitting: Hematology and Oncology

## 2021-11-09 DIAGNOSIS — M85852 Other specified disorders of bone density and structure, left thigh: Secondary | ICD-10-CM | POA: Diagnosis not present

## 2021-11-09 DIAGNOSIS — Z780 Asymptomatic menopausal state: Secondary | ICD-10-CM | POA: Diagnosis not present

## 2021-11-09 DIAGNOSIS — M8589 Other specified disorders of bone density and structure, multiple sites: Secondary | ICD-10-CM | POA: Diagnosis not present

## 2021-11-10 DIAGNOSIS — J45909 Unspecified asthma, uncomplicated: Secondary | ICD-10-CM | POA: Diagnosis not present

## 2021-11-28 ENCOUNTER — Telehealth: Payer: Self-pay

## 2021-11-28 DIAGNOSIS — J449 Chronic obstructive pulmonary disease, unspecified: Secondary | ICD-10-CM | POA: Diagnosis not present

## 2021-11-28 DIAGNOSIS — E1169 Type 2 diabetes mellitus with other specified complication: Secondary | ICD-10-CM | POA: Diagnosis not present

## 2021-11-28 DIAGNOSIS — I129 Hypertensive chronic kidney disease with stage 1 through stage 4 chronic kidney disease, or unspecified chronic kidney disease: Secondary | ICD-10-CM | POA: Diagnosis not present

## 2021-11-28 NOTE — Telephone Encounter (Signed)
Patient aware and will continue medication. ?

## 2021-11-28 NOTE — Telephone Encounter (Signed)
-----   Message from Marvia Pickles, PA-C sent at 11/28/2021  8:22 AM EST ----- ?Please let her know her bone density scan was better. Continue calcium and vitamin D. Thanks! ? ?

## 2021-12-06 DIAGNOSIS — E039 Hypothyroidism, unspecified: Secondary | ICD-10-CM | POA: Diagnosis not present

## 2021-12-06 DIAGNOSIS — E1169 Type 2 diabetes mellitus with other specified complication: Secondary | ICD-10-CM | POA: Diagnosis not present

## 2021-12-08 DIAGNOSIS — J45909 Unspecified asthma, uncomplicated: Secondary | ICD-10-CM | POA: Diagnosis not present

## 2021-12-13 DIAGNOSIS — E1142 Type 2 diabetes mellitus with diabetic polyneuropathy: Secondary | ICD-10-CM | POA: Diagnosis not present

## 2021-12-13 DIAGNOSIS — E782 Mixed hyperlipidemia: Secondary | ICD-10-CM | POA: Diagnosis not present

## 2021-12-13 DIAGNOSIS — E1169 Type 2 diabetes mellitus with other specified complication: Secondary | ICD-10-CM | POA: Diagnosis not present

## 2021-12-13 DIAGNOSIS — N1832 Chronic kidney disease, stage 3b: Secondary | ICD-10-CM | POA: Diagnosis not present

## 2021-12-16 NOTE — Progress Notes (Signed)
?[facility]  ?[address] ?[place],  [place]  [address] ?[phone] ? ?Clinic Day:  79/20/23 ? ?Referring physician: Marco Collie, MD ? ? ?CHIEF COMPLAINT:  ?CC:   Ductal carcinoma in situ of the breast ? ?Current Treatment:   Observation  ? ? ?HISTORY OF PRESENT ILLNESS:  ?[name] [name] is a 81 y.o. female with a history of stage 0 (TIS N0 M0) hormone receptor positive right breast cancer diagnosed in July 2015. Pathology revealed a 3.5 cm, grade 2, ductal carcinoma in situ.  She was treated with right mastectomy.  She was placed on chemoprevention with tamoxifen 20 mg daily, but discontinued this on her own.  When we saw her in February 2016, we recommended that the patient resume the tamoxifen.  We also recommended follow-up in 3 months, however, the patient did not schedule follow up until January of 2017 due to other medical problems and financial reasons. She stated she was taking tamoxifen daily at that time and remained without evidence of disease.  Bone density scan in October 2018 revealed a 6.1% improvement in her osteopenia with a T-score of -1.7 in the femur.  Screening mammogram in January 2019 revealed a possible mass in the left breast.  Diagnostic mammogram revealed a focally dilated duct in the subareolar area accounting for the finding on screening mammogram.  This was confirmed on ultrasound.  There was no evidence of malignancy ? ?Colonoscopy in June 2011 revealed multiple polyps which were removed, including 4 tubular adenomas and 2 tubulovillous adenomas.   She did not go for follow-up colonoscopy after that, so we referred her back to Dr. Lyda Jester. Colonoscopy in March 2017 revealeda tubular adenoma and so he recommended a repeat in 1 year.  Due to her personal and family history of malignancy and colon polyps, we recommended testing for hereditary cancer syndromes with the Myriad myRisk Hereditary Cancer Panel test.  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  Due to this finding, colonoscopy at least every 5 years is recommended.  The patient had repeat colonoscopy in January 2020 and had removal of tubular adenomas once again. ? ?She had a left shoulder replacement in September 2020 due to pain and humerus fracture. She has had bilateral knee surgeries. Bone density from October 2020 revealed worsening osteopenia with a T-score of -2.1 of the left femur neck, previously -1.7.  The left forearm radius was still within the normal range with a T-score of 0.8, previously 0.5.  She is taking calcium/vitamin D. She had a large soft mass of the right buttock excised by Dr. Lilia Pro.  Pathology revealed a soft tissue lipoma.  She also had a punch biopsy of a left shoulder lesion which revealed a junctional dysplastic melanocytic nevus with moderate to severe atypia.  This lesion was excised in October of 2021.  Residual dysplastic nevus was seen but the margins were free of involvement.  She was hospitalized in December of 2022, and was told she has congestive heart failure. Chest xray does show cardiomegaly. Repeat Echocardiogram in December of 2022 revealed normal left ventricular systolic function with an EF of 60-65%, but she does have moderate aortic stenosis and moderate mitral stenosis.  ? ?INTERVAL HISTORY:  ? [name] [name] is here for follow up of stage 0 breast cancer.  She had a repeat bone density scan on February 10 of this year which showed improvement.  Her left  hip T score went from -2.1 to -1.5 and is still osteopenia.  The left forearm is normal.  She is due for her left mammogram and so I will get that scheduled for her.  It has been over 3 years since her last colonoscopy. She denies fevers or chills. She denies pain. Her appetite is good. Her weight has decreased by 12 pounds over the last several months. ? ?REVIEW OF SYSTEMS:  ?Review of Systems  ?Constitutional:   Negative for appetite change, chills, fatigue, fever and unexpected weight change.  ?HENT:   Negative for lump/mass, mouth sores and sore throat.   ?Respiratory:  Negative for cough and shortness of breath.   ?Cardiovascular:  Negative for chest pain and leg swelling.  ?Gastrointestinal:  Negative for abdominal pain, constipation, diarrhea, nausea and vomiting.  ?Endocrine: Negative for hot flashes.  ?Genitourinary:  Negative for difficulty urinating, dysuria, frequency and hematuria.   ?Musculoskeletal:  Negative for arthralgias, back pain and myalgias.  ?Skin:  Negative for rash.  ?Neurological:  Negative for dizziness and headaches.  ?Hematological:  Negative for adenopathy. Does not bruise/bleed easily.  ?Psychiatric/Behavioral:  Negative for depression and sleep disturbance. The patient is not nervous/anxious.    ? ?VITALS:  ?Blood pressure 127/76, pulse 71, temperature 97.8 ?F (36.6 ?C), temperature source Oral, resp. rate 20, height 5' 4"  (1.626 m), weight 209 lb 6.4 oz (95 kg), SpO2 95 %.  ?Wt Readings from Last 3 Encounters:  ?12/17/21 209 lb 6.4 oz (95 kg)  ?11/05/21 221 lb 3.2 oz (100.3 kg)  ?09/13/21 223 lb (101.2 kg)  ?  ?Body mass index is 35.94 kg/m?. ? ?Performance status (ECOG): 1 - Symptomatic but completely ambulatory ? ?PHYSICAL EXAM:  ?Physical Exam ?Vitals and nursing note reviewed.  ?Constitutional:   ?   General: She is not in acute distress. ?   Appearance: Normal appearance.  ?HENT:  ?   Head: Normocephalic and atraumatic.  ?   Mouth/Throat:  ?   Mouth: Mucous membranes are moist.  ?   Pharynx: Oropharynx is clear. No oropharyngeal exudate or posterior oropharyngeal erythema.  ?Eyes:  ?   General: No scleral icterus. ?   Extraocular Movements: Extraocular movements intact.  ?   Conjunctiva/sclera: Conjunctivae normal.  ?   Pupils: Pupils are equal, round, and reactive to light.  ?Cardiovascular:  ?   Rate and Rhythm: Normal rate and regular rhythm.  ?   Heart sounds: Murmur heard.  ?  No  friction rub. No gallop.  ?   Comments: She has a 2/6 systolic murmur. ?Pulmonary:  ?   Effort: Pulmonary effort is normal.  ?   Breath sounds: Normal breath sounds. No rhonchi or rales.  ?Chest:  ?Breasts: ?   Right: Absent. No swelling, bleeding, inverted nipple, mass, nipple discharge, skin change or tenderness.  ?   Left: Normal.  ?   Comments:  Right mastectomy site is negative.  Left breast is without masses. ?Abdominal:  ?   General: There is no distension.  ?   Palpations: Abdomen is soft. There is no hepatomegaly, splenomegaly or mass.  ?   Tenderness: There is no abdominal tenderness.  ?Musculoskeletal:     ?   General: Normal range of motion.  ?   Cervical back: Normal range of motion and neck supple. No tenderness.  ?   Right lower leg: No edema.  ?   Left lower leg: No edema.  ?Lymphadenopathy:  ?   Cervical: No cervical adenopathy.  ?  Upper Body:  ?   Right upper body: No supraclavicular or axillary adenopathy.  ?   Left upper body: No supraclavicular or axillary adenopathy.  ?   Lower Body: No right inguinal adenopathy. No left inguinal adenopathy.  ?Skin: ?   General: Skin is warm and dry.  ?   Coloration: Skin is not jaundiced.  ?   Findings: No rash.  ?Neurological:  ?   Mental Status: She is alert and oriented to person, place, and time.  ?   Cranial Nerves: No cranial nerve deficit.  ?Psychiatric:     ?   Mood and Affect: Mood normal.     ?   Behavior: Behavior normal.     ?   Thought Content: Thought content normal.  ? ?LABS:  ? ? ?  Latest Ref Rng & Units 08/02/2010  ?  4:00 AM 08/01/2010  ?  4:15 AM 07/23/2010  ?  9:10 AM  ?CBC  ?WBC 4.0 - 10.5 K/uL 9.6   7.4   5.9    ?Hemoglobin 12.0 - 15.0 g/dL 10.6   11.8   14.3    ?Hematocrit 36.0 - 46.0 % 31.3   34.4   42.4    ?Platelets 150 - 400 K/uL 152   165   231    ? ? ?  Latest Ref Rng & Units 08/02/2010  ?  4:00 AM 08/01/2010  ?  4:15 AM 07/23/2010  ?  9:10 AM  ?CMP  ?Glucose 70 - 99 mg/dL 131   128   140    ?BUN 6 - 23 mg/dL 7   9   18      ?Creatinine 0.4 - 1.2 mg/dL 0.91   0.99   1.16    ?Sodium 135 - 145 mEq/L 135   136   141    ?Potassium 3.5 - 5.1 mEq/L 4.1   4.4   3.9    ?Chloride 96 - 112 mEq/L 100   102   105    ?CO2 19 - 32 mEq/L 30   30   28

## 2021-12-17 ENCOUNTER — Inpatient Hospital Stay: Payer: Medicare Other | Attending: Oncology | Admitting: Oncology

## 2021-12-17 ENCOUNTER — Encounter: Payer: Self-pay | Admitting: Oncology

## 2021-12-17 ENCOUNTER — Other Ambulatory Visit: Payer: Self-pay | Admitting: Oncology

## 2021-12-17 ENCOUNTER — Other Ambulatory Visit: Payer: Self-pay

## 2021-12-17 VITALS — BP 127/76 | HR 71 | Temp 97.8°F | Resp 20 | Ht 64.0 in | Wt 209.4 lb

## 2021-12-17 DIAGNOSIS — D0511 Intraductal carcinoma in situ of right breast: Secondary | ICD-10-CM

## 2021-12-17 DIAGNOSIS — D126 Benign neoplasm of colon, unspecified: Secondary | ICD-10-CM | POA: Diagnosis not present

## 2021-12-17 DIAGNOSIS — Z780 Asymptomatic menopausal state: Secondary | ICD-10-CM | POA: Diagnosis not present

## 2021-12-17 DIAGNOSIS — M8580 Other specified disorders of bone density and structure, unspecified site: Secondary | ICD-10-CM | POA: Diagnosis not present

## 2021-12-17 DIAGNOSIS — Z853 Personal history of malignant neoplasm of breast: Secondary | ICD-10-CM

## 2021-12-18 ENCOUNTER — Encounter: Payer: Self-pay | Admitting: Sports Medicine

## 2021-12-18 ENCOUNTER — Ambulatory Visit: Payer: Medicare Other | Admitting: Sports Medicine

## 2021-12-18 DIAGNOSIS — M2141 Flat foot [pes planus] (acquired), right foot: Secondary | ICD-10-CM

## 2021-12-18 DIAGNOSIS — M2142 Flat foot [pes planus] (acquired), left foot: Secondary | ICD-10-CM

## 2021-12-18 DIAGNOSIS — B351 Tinea unguium: Secondary | ICD-10-CM

## 2021-12-18 DIAGNOSIS — M79674 Pain in right toe(s): Secondary | ICD-10-CM | POA: Diagnosis not present

## 2021-12-18 DIAGNOSIS — M2040 Other hammer toe(s) (acquired), unspecified foot: Secondary | ICD-10-CM

## 2021-12-18 DIAGNOSIS — M79675 Pain in left toe(s): Secondary | ICD-10-CM

## 2021-12-18 DIAGNOSIS — I739 Peripheral vascular disease, unspecified: Secondary | ICD-10-CM

## 2021-12-18 DIAGNOSIS — L84 Corns and callosities: Secondary | ICD-10-CM

## 2021-12-18 DIAGNOSIS — E1142 Type 2 diabetes mellitus with diabetic polyneuropathy: Secondary | ICD-10-CM

## 2021-12-18 NOTE — Progress Notes (Signed)
Subjective: ?[name] [name] is a 81 y.o. female patient with history of diabetes who returns to office today complaining of long,mildly painful nails while ambulating in shoes; unable to trim.  States that she is doing okay and is sad today because she received the letter about me leaving, no other issues noted. ? ?Fasting blood sugar patient does not check ?Last A1c 6 ?Last visit to PCP Marco Collie, MD  in December 2022 ? ?Patient Active Problem List  ? Diagnosis Date Noted  ? Swelling 11/02/2021  ? Sinus problem 11/02/2021  ? Muscle pain 11/02/2021  ? Morbid obesity ([place]) 11/02/2021  ? Mitral valve stenosis 11/02/2021  ? Hypothyroid 11/02/2021  ? HTN (hypertension) 11/02/2021  ? History of breast cancer 11/02/2021  ? Hearing loss 11/02/2021  ? DM type 2 with diabetic mixed hyperlipidemia ([place]) 11/02/2021  ? Diabetic peripheral neuropathy ([place]) 11/02/2021  ? Asthma 11/02/2021  ? Arthritis 11/02/2021  ? Benign hypertension with CKD (chronic kidney disease) stage III ([place]) 10/05/2021  ? COPD (chronic obstructive pulmonary disease) ([place]) 10/05/2021  ? Diastolic congestive heart failure ([address]) 10/05/2021  ? HLD (hyperlipidemia) 10/05/2021  ? Aortic valve stenosis, moderate 10/05/2021  ? Intraductal carcinoma in situ of right breast 04/27/2014  ? ?Current Outpatient Medications on File Prior to Visit  ?Medication Sig Dispense Refill  ? Acetaminophen (TYLENOL 8 HOUR ARTHRITIS PAIN PO) Take by mouth. 1-2 tablets  as needed    ? albuterol (VENTOLIN HFA) 108 (90 Base) MCG/ACT inhaler Inhale 2 puffs into the lungs every 6 (six) hours as needed for wheezing or shortness of breath.    ? aspirin 81 MG EC tablet Take 81 mg by mouth daily. Swallow whole.    ? calcium carbonate (OSCAL) 1500 (600 Ca) MG TABS tablet Take 1 tablet by mouth daily with breakfast.    ? FARXIGA 10 MG TABS tablet Take 10 mg by mouth daily.    ? fluticasone (FLONASE) 50 MCG/ACT nasal spray Place into both nostrils daily. 1-2 sprays daily as  needed    ? Fluticasone-Umeclidin-Vilant (TRELEGY ELLIPTA) 100-62.5-25 MCG/ACT AEPB Inhale 1 puff into the lungs daily.    ? furosemide (LASIX) 40 MG tablet Take 40 mg once daily and take extra furosemide at noon every other day 135 tablet 3  ? gabapentin (NEURONTIN) 100 MG capsule Take 100 mg by mouth 2 (two) times daily.    ? hydrocortisone cream 1 % Apply 1 application. topically daily as needed for itching.    ? levothyroxine (SYNTHROID) 88 MCG tablet Take 88 mcg by mouth daily before breakfast.    ? loratadine (CLARITIN) 10 MG tablet Take 10 mg by mouth daily.    ? Magnesium 250 MG TABS Take 250 mg by mouth daily.    ? Melatonin 10 MG CAPS Take 1 capsule by mouth at bedtime as needed.    ? OXYGEN Inhale into the lungs. 2 LPM via [place] nightly    ? polymixin-bacitracin (POLYSPORIN) 500-10000 UNIT/GM OINT ointment Apply 1 application. topically 2 (two) times daily.    ? potassium chloride (KLOR-CON) 10 MEQ tablet Take 1 tablet (10 mEq total) by mouth daily. 90 tablet 3  ? pravastatin (PRAVACHOL) 40 MG tablet Take 40 mg by mouth daily.    ? ?No current facility-administered medications on file prior to visit.  ? ?Allergies  ?Allergen Reactions  ? Darvon [Propoxyphene] Swelling  ? Penicillins Swelling  ? ? ?No results found for this or any previous visit (from the past 2160 hour(s)). ? ?  Objective: ?General: Patient is awake, alert, and oriented x 3 and in no acute distress. ? ?Integument: Skin is warm, dry and supple bilateral. Nails are tender, long, thickened and dystrophic with subungual debris, consistent with onychomycosis, 1-5 bilateral. No signs of infection.  No open lesions or preulcerative lesions present bilateral. Minimal callus to 1st toes bilateral and dry fissures. Remaining integument unremarkable. ? ?Vasculature:  Dorsalis Pedis pulse 2/4 bilateral. Posterior Tibial pulse  1/4 bilateral. Capillary fill time <3 sec 1-5 bilateral. Positive hair growth to the level of the digits.Temperature gradient  within normal limits.  Mild varicosities and venous pigmentation present bilateral.  +1 pitting edema present bilateral. ? ?Neurology: The patient has diminished sensation measured with a 5.07/10g Semmes Weinstein Monofilament at all pedal sites bilateral. ? ?Musculoskeletal: Asymptomatic hammertoe and pes planus pedal deformities noted bilateral. ? ?Assessment and Plan: ?Problem List Items Addressed This Visit   ?None ?Visit Diagnoses   ? ? Pain due to onychomycosis of toenails of both feet    -  Primary  ? Foot callus      ? Diabetic polyneuropathy associated with type 2 diabetes mellitus ([place])      ? PVD (peripheral vascular disease) ([place])      ? Hammer toe, unspecified laterality      ? Pes planus of both feet      ? ?  ? ? ?-Examined patient. ?-Re-Discussed and educated patient on diabetic foot care ?-Mechanically debrided all nails 1-5 bilateral using sterile nail nipper and filed with dremel without incident  ?-Using a sterile 15 blade mechanically debrided callus at first MPJs bilateral without incident at no additional charge ?-Continue with elevation of legs and medical management for edema like before ?-Patient to return in 3 months for at risk foot care as scheduled with Dr. Blenda Mounts ?-Patient advised to call the office if any problems or questions arise in the meantime. ? ?Landis Martins, DPM ?

## 2021-12-29 DIAGNOSIS — E1169 Type 2 diabetes mellitus with other specified complication: Secondary | ICD-10-CM | POA: Diagnosis not present

## 2021-12-29 DIAGNOSIS — E039 Hypothyroidism, unspecified: Secondary | ICD-10-CM | POA: Diagnosis not present

## 2022-01-01 DIAGNOSIS — Z1231 Encounter for screening mammogram for malignant neoplasm of breast: Secondary | ICD-10-CM | POA: Diagnosis not present

## 2022-01-02 ENCOUNTER — Encounter: Payer: Self-pay | Admitting: Oncology

## 2022-01-03 DIAGNOSIS — D126 Benign neoplasm of colon, unspecified: Secondary | ICD-10-CM | POA: Insufficient documentation

## 2022-01-03 HISTORY — DX: Benign neoplasm of colon, unspecified: D12.6

## 2022-01-08 DIAGNOSIS — J45909 Unspecified asthma, uncomplicated: Secondary | ICD-10-CM | POA: Diagnosis not present

## 2022-01-09 ENCOUNTER — Telehealth: Payer: Self-pay

## 2022-01-09 NOTE — Telephone Encounter (Signed)
-----   Message from Derwood Kaplan, MD sent at 01/02/2022  7:43 PM EDT ----- ?Regarding: call ?Tell her mammo is clear ? ?

## 2022-01-28 DIAGNOSIS — E039 Hypothyroidism, unspecified: Secondary | ICD-10-CM | POA: Diagnosis not present

## 2022-01-28 DIAGNOSIS — E1169 Type 2 diabetes mellitus with other specified complication: Secondary | ICD-10-CM | POA: Diagnosis not present

## 2022-02-07 ENCOUNTER — Other Ambulatory Visit: Payer: Medicare Other

## 2022-02-07 ENCOUNTER — Other Ambulatory Visit: Payer: Self-pay | Admitting: Family Medicine

## 2022-02-07 DIAGNOSIS — R2232 Localized swelling, mass and lump, left upper limb: Secondary | ICD-10-CM | POA: Diagnosis not present

## 2022-02-07 DIAGNOSIS — J45909 Unspecified asthma, uncomplicated: Secondary | ICD-10-CM | POA: Diagnosis not present

## 2022-02-08 ENCOUNTER — Ambulatory Visit
Admission: RE | Admit: 2022-02-08 | Discharge: 2022-02-08 | Disposition: A | Discharge status: Home / Self Care | Payer: Medicare Other | Source: Ambulatory Visit | Attending: Family Medicine | Admitting: Family Medicine

## 2022-02-08 DIAGNOSIS — M79602 Pain in left arm: Secondary | ICD-10-CM | POA: Diagnosis not present

## 2022-02-08 DIAGNOSIS — R600 Localized edema: Secondary | ICD-10-CM | POA: Diagnosis not present

## 2022-02-08 DIAGNOSIS — R2232 Localized swelling, mass and lump, left upper limb: Secondary | ICD-10-CM

## 2022-02-18 DIAGNOSIS — K648 Other hemorrhoids: Secondary | ICD-10-CM | POA: Diagnosis not present

## 2022-02-18 DIAGNOSIS — R1013 Epigastric pain: Secondary | ICD-10-CM | POA: Diagnosis not present

## 2022-02-28 DIAGNOSIS — E039 Hypothyroidism, unspecified: Secondary | ICD-10-CM | POA: Diagnosis not present

## 2022-02-28 DIAGNOSIS — E1169 Type 2 diabetes mellitus with other specified complication: Secondary | ICD-10-CM | POA: Diagnosis not present

## 2022-03-10 DIAGNOSIS — J45909 Unspecified asthma, uncomplicated: Secondary | ICD-10-CM | POA: Diagnosis not present

## 2022-03-25 ENCOUNTER — Ambulatory Visit: Payer: Medicare Other | Admitting: Podiatry

## 2022-03-26 ENCOUNTER — Encounter: Payer: Self-pay | Admitting: Podiatrist

## 2022-03-26 ENCOUNTER — Ambulatory Visit: Payer: Medicare Other | Admitting: Podiatrist

## 2022-03-26 DIAGNOSIS — M79675 Pain in left toe(s): Secondary | ICD-10-CM

## 2022-03-26 DIAGNOSIS — B351 Tinea unguium: Secondary | ICD-10-CM | POA: Diagnosis not present

## 2022-03-26 DIAGNOSIS — M79674 Pain in right toe(s): Secondary | ICD-10-CM

## 2022-03-26 NOTE — Progress Notes (Signed)
Subjective: [name] [name] is a 81 y.o. female patient with history of diabetes who presents to office today complaining of long,mildly painful nails  while ambulating in shoes; unable to trim.  Patient denies any new cramping, numbness, burning or tingling in the legs. Patient  relates she was diagnosed with CHF in december  Marco Collie, MD  is her primary care provider.  Patient Active Problem List   Diagnosis Date Noted   Tubular adenoma of colon 01/03/2022    Class: Diagnosis of   Polyposis associated with heterozygous mutation in MUTYH gene 01/03/2022    Class: Chronic   Swelling 11/02/2021   Sinus problem 11/02/2021   Muscle pain 11/02/2021   Morbid obesity ([place]) 11/02/2021   Mitral valve stenosis 11/02/2021   Hypothyroid 11/02/2021   HTN (hypertension) 11/02/2021   History of breast cancer 11/02/2021   Hearing loss 11/02/2021   DM type 2 with diabetic mixed hyperlipidemia ([place]) 11/02/2021   Diabetic peripheral neuropathy ([place]) 11/02/2021   Asthma 11/02/2021   Arthritis 11/02/2021   Benign hypertension with CKD (chronic kidney disease) stage III ([place]) 10/05/2021   COPD (chronic obstructive pulmonary disease) ([place]) 40/81/4481   Diastolic congestive heart failure ([place]) 10/05/2021   HLD (hyperlipidemia) 10/05/2021   Aortic valve stenosis, moderate 10/05/2021   Osteopenia after menopause 07/11/2017    Class: Chronic   Intraductal carcinoma in situ of right breast 04/27/2014   Current Outpatient Medications on File Prior to Visit  Medication Sig Dispense Refill   Acetaminophen (TYLENOL 8 HOUR ARTHRITIS PAIN PO) Take by mouth. 1-2 tablets  as needed     albuterol (VENTOLIN HFA) 108 (90 Base) MCG/ACT inhaler Inhale 2 puffs into the lungs every 6 (six) hours as needed for wheezing or shortness of breath.     aspirin 81 MG EC tablet Take 81 mg by mouth daily. Swallow whole.     calcium carbonate (OSCAL) 1500 (600 Ca) MG TABS tablet Take 1 tablet by mouth daily with  breakfast.     FARXIGA 10 MG TABS tablet Take 10 mg by mouth daily.     fluticasone (FLONASE) 50 MCG/ACT nasal spray Place into both nostrils daily. 1-2 sprays daily as needed     Fluticasone-Umeclidin-Vilant (TRELEGY ELLIPTA) 100-62.5-25 MCG/ACT AEPB Inhale 1 puff into the lungs daily.     furosemide (LASIX) 40 MG tablet Take 40 mg once daily and take extra furosemide at noon every other day 135 tablet 3   gabapentin (NEURONTIN) 100 MG capsule Take 100 mg by mouth 2 (two) times daily.     hydrocortisone cream 1 % Apply 1 application. topically daily as needed for itching.     levothyroxine (SYNTHROID) 75 MCG tablet Take 75 mcg by mouth daily.     levothyroxine (SYNTHROID) 88 MCG tablet Take 88 mcg by mouth daily before breakfast.     loratadine (CLARITIN) 10 MG tablet Take 10 mg by mouth daily.     Magnesium 250 MG TABS Take 250 mg by mouth daily.     Melatonin 10 MG CAPS Take 1 capsule by mouth at bedtime as needed.     OXYGEN Inhale into the lungs. 2 LPM via [place] nightly     pioglitazone (ACTOS) 30 MG tablet Take 30 mg by mouth daily.     polymixin-bacitracin (POLYSPORIN) 500-10000 UNIT/GM OINT ointment Apply 1 application. topically 2 (two) times daily.     potassium chloride (KLOR-CON) 10 MEQ tablet Take 1 tablet (10 mEq total) by mouth daily. 90 tablet 3  pravastatin (PRAVACHOL) 40 MG tablet Take 40 mg by mouth daily.     No current facility-administered medications on file prior to visit.   Allergies  Allergen Reactions   Darvon [Propoxyphene] Swelling   Penicillins Swelling      Objective: General: Patient is awake, alert, and oriented x 3 and in no acute distress.  Integument: Skin is warm, dry and supple bilateral. Nails are tender, long, thickened and  dystrophic with subungual debris, consistent with onychomycosis, 1-5 bilateral. No signs of infection. No open lesions or preulcerative lesions present bilateral. Remaining integument unremarkable.  Vasculature:  Dorsalis  Pedis pulse 2/4 bilateral. Posterior Tibial pulse 1 /4 bilateral.  Capillary fill time <3 sec 1-5 bilateral. Temperature gradient within normal limits. No varicosities present bilateral. No edema present bilateral.   Neurology: The patient has decreased sensation measured with a 5.07/10g Semmes Weinstein Monofilament at  2/5 pedal sites bilateral . Vibratory sensation diminished bilateral with tuning fork. No Babinski sign present bilateral. Subjective tingling reported.   Musculoskeletal: No symptomatic pedal deformities noted bilateral. Muscular strength 5/5 in all lower extremity muscular groups bilateral without pain on range of motion . No tenderness with calf compression bilateral.  Assessment and Plan:   ICD-10-CM   1. Pain due to onychomycosis of toenails of both feet  B35.1    M79.675    M79.674        -Examined patient. -Mechanically debrided all nails 1-5 bilateral using sterile nail nipper and filed with dremel without incident  -Answered all patient questions -Patient to return  in 3 months for at risk foot care -Patient advised to call the office if any problems or questions arise in the meantime.  Bronson Ing, DPM

## 2022-03-29 DIAGNOSIS — K5730 Diverticulosis of large intestine without perforation or abscess without bleeding: Secondary | ICD-10-CM | POA: Diagnosis not present

## 2022-03-29 DIAGNOSIS — Z09 Encounter for follow-up examination after completed treatment for conditions other than malignant neoplasm: Secondary | ICD-10-CM | POA: Diagnosis not present

## 2022-03-29 DIAGNOSIS — E119 Type 2 diabetes mellitus without complications: Secondary | ICD-10-CM | POA: Diagnosis not present

## 2022-03-29 DIAGNOSIS — Z86010 Personal history of colonic polyps: Secondary | ICD-10-CM | POA: Diagnosis not present

## 2022-03-29 DIAGNOSIS — J449 Chronic obstructive pulmonary disease, unspecified: Secondary | ICD-10-CM | POA: Diagnosis not present

## 2022-03-29 LAB — HM COLONOSCOPY

## 2022-03-30 DIAGNOSIS — E1142 Type 2 diabetes mellitus with diabetic polyneuropathy: Secondary | ICD-10-CM | POA: Diagnosis not present

## 2022-03-30 DIAGNOSIS — E039 Hypothyroidism, unspecified: Secondary | ICD-10-CM | POA: Diagnosis not present

## 2022-04-09 DIAGNOSIS — J45909 Unspecified asthma, uncomplicated: Secondary | ICD-10-CM | POA: Diagnosis not present

## 2022-04-10 DIAGNOSIS — E119 Type 2 diabetes mellitus without complications: Secondary | ICD-10-CM | POA: Diagnosis not present

## 2022-04-10 DIAGNOSIS — M79672 Pain in left foot: Secondary | ICD-10-CM | POA: Diagnosis not present

## 2022-04-18 DIAGNOSIS — E1169 Type 2 diabetes mellitus with other specified complication: Secondary | ICD-10-CM | POA: Diagnosis not present

## 2022-04-18 DIAGNOSIS — E039 Hypothyroidism, unspecified: Secondary | ICD-10-CM | POA: Diagnosis not present

## 2022-04-23 ENCOUNTER — Ambulatory Visit (INDEPENDENT_AMBULATORY_CARE_PROVIDER_SITE_OTHER): Payer: Medicare Other

## 2022-04-23 DIAGNOSIS — I350 Nonrheumatic aortic (valve) stenosis: Secondary | ICD-10-CM

## 2022-04-23 LAB — ECHOCARDIOGRAM COMPLETE
AR max vel: 1.58 cm2
AV Area VTI: 1.51 cm2
AV Area mean vel: 1.55 cm2
AV Mean grad: 26.5 mmHg
AV Peak grad: 40.7 mmHg
Ao pk vel: 3.19 m/s
Area-P 1/2: 1.97 cm2
MV VTI: 1.83 cm2
S' Lateral: 3.4 cm

## 2022-04-24 ENCOUNTER — Telehealth: Payer: Self-pay | Admitting: Cardiology

## 2022-04-24 NOTE — Telephone Encounter (Signed)
Patient returned RN's call to get results.

## 2022-04-24 NOTE — Telephone Encounter (Signed)
Pt is returning call in regards to echo results. Requesting call back.  

## 2022-04-25 DIAGNOSIS — Z139 Encounter for screening, unspecified: Secondary | ICD-10-CM | POA: Diagnosis not present

## 2022-04-25 DIAGNOSIS — J449 Chronic obstructive pulmonary disease, unspecified: Secondary | ICD-10-CM | POA: Diagnosis not present

## 2022-04-25 DIAGNOSIS — Z720 Tobacco use: Secondary | ICD-10-CM | POA: Diagnosis not present

## 2022-04-25 DIAGNOSIS — I129 Hypertensive chronic kidney disease with stage 1 through stage 4 chronic kidney disease, or unspecified chronic kidney disease: Secondary | ICD-10-CM | POA: Diagnosis not present

## 2022-04-25 DIAGNOSIS — E039 Hypothyroidism, unspecified: Secondary | ICD-10-CM | POA: Diagnosis not present

## 2022-04-25 DIAGNOSIS — Z0000 Encounter for general adult medical examination without abnormal findings: Secondary | ICD-10-CM | POA: Diagnosis not present

## 2022-04-25 DIAGNOSIS — E1142 Type 2 diabetes mellitus with diabetic polyneuropathy: Secondary | ICD-10-CM | POA: Diagnosis not present

## 2022-04-25 DIAGNOSIS — E1169 Type 2 diabetes mellitus with other specified complication: Secondary | ICD-10-CM | POA: Diagnosis not present

## 2022-04-25 NOTE — Telephone Encounter (Signed)
Patient informed of results.  

## 2022-04-29 ENCOUNTER — Other Ambulatory Visit: Payer: Self-pay

## 2022-04-29 NOTE — Progress Notes (Unsigned)
Cardiology Office Note:    Date:  04/30/2022   ID:  [name] [name], [place] November 05, 1940, MRN [id number]  PCP:  Marco Collie, MD  Cardiologist:  Shirlee More, MD    Referring MD: Marco Collie, MD she takes Actos that can be problematic with heart failure, consider alternative treatment.   ASSESSMENT:    1. Nonrheumatic aortic valve stenosis   2. Nonrheumatic mitral valve stenosis   3. Hypertensive heart disease with heart failure ([place])   4. Chronic obstructive pulmonary disease, unspecified COPD type ([place])   5. Stage 3 chronic kidney disease, unspecified whether stage 3a or 3b CKD (HCC)    PLAN:    In order of problems listed above:  Valvular heart disease mitral stenosis aortic stenosis has not progressed hypertension with heart failure is well compensated she will continue her current loop diuretic and we will plan to do a repeat echocardiogram in about 1 year Continue her current antihypertensives including beta-blocker which is controlled hypertension Stable COPD continue her bronchodilators Stable CKD She is on Actos potentially a problem with heart disease heart failure I will send a note to her PCP to consider alternate therapy especially GLP-1 agent along with her SGLT2 inhibitor   Next appointment: 6 months   Medication Adjustments/Labs and Tests Ordered: Current medicines are reviewed at length with the patient today.  Concerns regarding medicines are outlined above.  No orders of the defined types were placed in this encounter.  No orders of the defined types were placed in this encounter.   Chief Complaint  Patient presents with   Follow-up   Aortic Stenosis   Aortic Insuffiency    After cardiac echo    History of Present Illness:    [name] [name] is a 81 y.o. female with a hx of hypertensive heart disease with heart failure stage III CKD and COPD admitted to [facility] January 2022 BNP was elevated 2080 and echocardiogram  showed findings of diastolic heart failure mitral and aortic valve disease last seen 11/05/2021.  Compliance with diet, lifestyle and medications: Yes  She is very happy with the quality of her life she says she is lost 20 pounds her breathing is back to normal she feels so well she has a large garden and is very active outdoors and has had no exercise intolerance edema shortness of breath chest pain palpitation or syncope Her son is present participates in evaluation we reviewed the results of her echocardiogram with stable  aortic valve disease mild to moderate stenosis and calcific mitral valve disease with mild to moderate stenosis.  She had an echocardiogram performed 04/23/2022 showing mild calcification and thickened aortic valve with mild to moderate aortic stenosis valve area 1.5 cm with a mean gradient of 26.5 mmHg.  There is moderate concentric LVH normal ejection fraction grade 1 diastolic dysfunction and the right ventricle showed normal size function and pulmonary artery pressure there is also mild to moderate mitral stenosis calcific mitral valve disease.     ECHOCARDIOGRAM REPORT     1. Left ventricular ejection fraction, by estimation, is 60 to 65%. The  left ventricle has normal function. The left ventricle has no regional  wall motion abnormalities. There is moderate left ventricular hypertrophy.  Left ventricular diastolic  parameters are consistent with Grade I diastolic dysfunction (impaired  relaxation).   2. Right ventricular systolic function is normal. The right ventricular  size is normal. There is normal pulmonary artery systolic pressure.   3. The mitral  valve is normal in structure. No evidence of mitral valve  regurgitation. Mild to moderate mitral stenosis.   4. The aortic valve is normal in structure. There is mild calcification  of the aortic valve. There is mild thickening of the aortic valve. Aortic  valve regurgitation is not visualized. Mild to moderate  aortic valve  stenosis. Aortic valve area, by VTI  measures 1.51 cm. Aortic valve mean gradient measures 26.5 mmHg.   5. The inferior vena cava is normal in size with greater than 50%  respiratory variability, suggesting right atrial pressure of 3 mmHg.    Past Medical History:  Diagnosis Date   Aortic valve stenosis, moderate    Arthritis    Asthma    Benign hypertension with CKD (chronic kidney disease) stage III (HCC)    COPD (chronic obstructive pulmonary disease) (HCC)    Diabetic peripheral neuropathy (HCC)    Diastolic congestive heart failure ([place])    DM type 2 with diabetic mixed hyperlipidemia (HCC)    Hearing loss    History of breast cancer    HLD (hyperlipidemia)    HTN (hypertension)    Hypothyroid    Intraductal carcinoma in situ of right breast 04/27/2014   Mitral valve stenosis    Morbid obesity (HCC)    Muscle pain    Osteopenia after menopause 07/11/2017   Polyposis associated with heterozygous mutation in MUTYH gene 01/03/2022   Sinus problem    Swelling    Tubular adenoma of colon 01/03/2022   Multiple, MUTYH mutation    Past Surgical History:  Procedure Laterality Date   BACK SURGERY  02/14/2014   MASTECTOMY Right 2015   PARTIAL HYSTERECTOMY  1980   TONSILLECTOMY     TOTAL KNEE ARTHROPLASTY Right 02/07/2011    Current Medications: Current Meds  Medication Sig   Acetaminophen (TYLENOL 8 HOUR ARTHRITIS PAIN PO) Take 1-2 tablets by mouth as needed for pain.   albuterol (VENTOLIN HFA) 108 (90 Base) MCG/ACT inhaler Inhale 2 puffs into the lungs every 6 (six) hours as needed for wheezing or shortness of breath.   aspirin 81 MG EC tablet Take 81 mg by mouth daily. Swallow whole.   calcium carbonate (OSCAL) 1500 (600 Ca) MG TABS tablet Take 1 tablet by mouth daily with breakfast.   FARXIGA 10 MG TABS tablet Take 10 mg by mouth daily.   fluticasone (FLONASE) 50 MCG/ACT nasal spray Place 1-2 sprays into both nostrils as needed for allergies or rhinitis.    Fluticasone-Umeclidin-Vilant (TRELEGY ELLIPTA) 100-62.5-25 MCG/ACT AEPB Inhale 1 puff into the lungs daily.   furosemide (LASIX) 40 MG tablet Take 40 mg once daily and take extra furosemide at noon every other day   gabapentin (NEURONTIN) 100 MG capsule Take 100 mg by mouth 2 (two) times daily.   hydrocortisone cream 1 % Apply 1 application  topically daily as needed for itching.   levothyroxine (SYNTHROID) 88 MCG tablet Take 88 mcg by mouth daily before breakfast.   loratadine (CLARITIN) 10 MG tablet Take 10 mg by mouth daily.   Magnesium 250 MG TABS Take 250 mg by mouth daily.   OXYGEN Inhale 2 L into the lungs at bedtime.   pioglitazone (ACTOS) 30 MG tablet Take 30 mg by mouth daily.   polymixin-bacitracin (POLYSPORIN) 500-10000 UNIT/GM OINT ointment Apply 1 application  topically 2 (two) times daily.   potassium chloride (KLOR-CON) 10 MEQ tablet Take 10 mEq by mouth daily.   pravastatin (PRAVACHOL) 40 MG tablet Take 40 mg by  mouth daily.     Allergies:   Darvon [propoxyphene] and Penicillins   Social History   Socioeconomic History   Marital status: Widowed    Spouse name: Not on file   Number of children: 3   Years of education: Not on file   Highest education level: Not on file  Occupational History   Occupation: bakery    Employer: [id number]  Tobacco Use   Smoking status: Never    Passive exposure: Never   Smokeless tobacco: Never  Vaping Use   Vaping Use: Never used  Substance and Sexual Activity   Alcohol use: Yes    Comment: Minimal amount   Drug use: No   Sexual activity: Not on file  Other Topics Concern   Not on file  Social History Narrative   Not on file   Social Determinants of Health   Financial Resource Strain: Not on file  Food Insecurity: Not on file  Transportation Needs: Not on file  Physical Activity: Not on file  Stress: Not on file  Social Connections: Not on file     Family History: The patient's family history includes Alzheimer's disease  in an other family member; Breast cancer in her sister; Diabetes Mellitus I in her mother; Heart disease in her mother. ROS:   Please see the history of present illness.    All other systems reviewed and are negative.  EKGs/Labs/Other Studies Reviewed:    The following studies were reviewed today:  EKG:  EKG ordered today and personally reviewed.  The ekg ordered today demonstrates sinus rhythm bifascicular heart block right bundle branch block left anterior hemiblock  Recent Labs: 04/05/2022 cholesterol 170 LDL 100 A1c 6.9% hemoglobin 12.3 creatinine 1.09 potassium 4.7  Physical Exam:    VS:  BP 114/68   Pulse 77   Ht '5\' 4"'$  (1.626 m)   Wt 202 lb 3.2 oz (91.7 kg)   SpO2 94%   BMI 34.71 kg/m     Wt Readings from Last 3 Encounters:  04/30/22 202 lb 3.2 oz (91.7 kg)  12/17/21 209 lb 6.4 oz (95 kg)  11/05/21 221 lb 3.2 oz (100.3 kg)     GEN: Appears her age well nourished, well developed in no acute distress HEENT: Normal NECK: No JVD; No carotid bruits LYMPHATICS: No lymphadenopathy CARDIAC: 2/6 aortic outflow murmur does not radiate to the carotids S2 splits also grade 1/6 to 2/6 mitral stenosis RRR, no murmurs, rubs, gallops RESPIRATORY:  Clear to auscultation without rales, wheezing or rhonchi  ABDOMEN: Soft, non-tender, non-distended MUSCULOSKELETAL:  No edema; No deformity  SKIN: Warm and dry NEUROLOGIC:  Alert and oriented x 3 PSYCHIATRIC:  Normal affect    Signed, Shirlee More, MD  04/30/2022 3:50 PM    [facility]

## 2022-04-30 ENCOUNTER — Encounter: Payer: Self-pay | Admitting: Cardiology

## 2022-04-30 ENCOUNTER — Ambulatory Visit: Payer: Medicare Other | Admitting: Podiatrist

## 2022-04-30 ENCOUNTER — Ambulatory Visit: Payer: Medicare Other | Admitting: Cardiology

## 2022-04-30 VITALS — BP 114/68 | HR 77 | Ht 64.0 in | Wt 202.2 lb

## 2022-04-30 DIAGNOSIS — E1142 Type 2 diabetes mellitus with diabetic polyneuropathy: Secondary | ICD-10-CM | POA: Diagnosis not present

## 2022-04-30 DIAGNOSIS — N183 Chronic kidney disease, stage 3 unspecified: Secondary | ICD-10-CM

## 2022-04-30 DIAGNOSIS — I110 Hypertensive heart disease with heart failure: Secondary | ICD-10-CM | POA: Diagnosis not present

## 2022-04-30 DIAGNOSIS — E782 Mixed hyperlipidemia: Secondary | ICD-10-CM

## 2022-04-30 DIAGNOSIS — E1169 Type 2 diabetes mellitus with other specified complication: Secondary | ICD-10-CM | POA: Diagnosis not present

## 2022-04-30 DIAGNOSIS — I342 Nonrheumatic mitral (valve) stenosis: Secondary | ICD-10-CM

## 2022-04-30 DIAGNOSIS — E039 Hypothyroidism, unspecified: Secondary | ICD-10-CM | POA: Diagnosis not present

## 2022-04-30 DIAGNOSIS — I350 Nonrheumatic aortic (valve) stenosis: Secondary | ICD-10-CM | POA: Diagnosis not present

## 2022-04-30 DIAGNOSIS — J449 Chronic obstructive pulmonary disease, unspecified: Secondary | ICD-10-CM | POA: Diagnosis not present

## 2022-04-30 MED ORDER — ROSUVASTATIN CALCIUM 10 MG PO TABS: 10.0000 mg | ORAL_TABLET | Freq: Every day | ORAL | 3 refills | Status: DC

## 2022-04-30 NOTE — Patient Instructions (Signed)
Medication Instructions:  Your physician has recommended you make the following change in your medication:   STOP: Pravastatin START: Rosuvastatin 10 mg daily  *If you need a refill on your cardiac medications before your next appointment, please call your pharmacy*   Lab Work: None  If you have labs (blood work) drawn today and your tests are completely normal, you will receive your results only by: [place] (if you have MyChart) OR A paper copy in the mail If you have any lab test that is abnormal or we need to change your treatment, we will call you to review the results.   Testing/Procedures: None   Follow-Up: At [facility], you and your health needs are our priority.  As part of our continuing mission to provide you with exceptional heart care, we have created designated Provider Care Teams.  These Care Teams include your primary Cardiologist (physician) and Advanced Practice Providers (APPs -  Physician Assistants and Nurse Practitioners) who all work together to provide you with the care you need, when you need it.  We recommend signing up for the patient portal called "MyChart".  Sign up information is provided on this After Visit Summary.  MyChart is used to connect with patients for Virtual Visits (Telemedicine).  Patients are able to view lab/test results, encounter notes, upcoming appointments, etc.  Non-urgent messages can be sent to your provider as well.   To learn more about what you can do with MyChart, go to [email or web address].    Your next appointment:   6 month(s)  The format for your next appointment:   In Person  Provider:   Shirlee More, MD    Other Instructions None  Important Information About Sugar

## 2022-05-10 DIAGNOSIS — J45909 Unspecified asthma, uncomplicated: Secondary | ICD-10-CM | POA: Diagnosis not present

## 2022-05-16 DIAGNOSIS — R82998 Other abnormal findings in urine: Secondary | ICD-10-CM | POA: Diagnosis not present

## 2022-05-16 DIAGNOSIS — R42 Dizziness and giddiness: Secondary | ICD-10-CM | POA: Diagnosis not present

## 2022-05-16 DIAGNOSIS — Z20822 Contact with and (suspected) exposure to covid-19: Secondary | ICD-10-CM | POA: Diagnosis not present

## 2022-05-16 DIAGNOSIS — R432 Parageusia: Secondary | ICD-10-CM | POA: Diagnosis not present

## 2022-05-28 ENCOUNTER — Encounter: Payer: Self-pay | Admitting: Podiatrist

## 2022-05-28 ENCOUNTER — Ambulatory Visit: Payer: Medicare Other | Admitting: Podiatrist

## 2022-05-28 DIAGNOSIS — B351 Tinea unguium: Secondary | ICD-10-CM

## 2022-05-28 DIAGNOSIS — S93601D Unspecified sprain of right foot, subsequent encounter: Secondary | ICD-10-CM | POA: Diagnosis not present

## 2022-05-28 DIAGNOSIS — M79675 Pain in left toe(s): Secondary | ICD-10-CM

## 2022-05-28 DIAGNOSIS — M79674 Pain in right toe(s): Secondary | ICD-10-CM

## 2022-05-28 DIAGNOSIS — E1142 Type 2 diabetes mellitus with diabetic polyneuropathy: Secondary | ICD-10-CM | POA: Diagnosis not present

## 2022-05-28 NOTE — Progress Notes (Unsigned)
Chief Complaint  Patient presents with   Nail Problem    Diabetic foot care, pt went to the urgent care      HPI: Patient is 81 y.o. female who presents today for diabetic foot care as well as check of right foot.  She was seen at urgent care 04/10/22 as she was unable to walk. She relates xrays were taken and the foot is now improved.    Allergies  Allergen Reactions   Darvon [Propoxyphene] Swelling   Penicillins Swelling    Review of systems is negative except as noted in the HPI.  Denies nausea/ vomiting/ fevers/ chills or night sweats.   Denies difficulty breathing, denies calf pain or tenderness  Physical Exam  Patient is awake, alert, and oriented x 3.  In no acute distress.    Vascular status is intact with palpable pedal pulses DP and PT bilateral and capillary refill time less than 3 seconds bilateral.  No edema or erythema noted.   Neurological exam reveals epicritic and protective sensation grossly intact bilateral.   Dermatological exam reveals skin is supple and dry to bilateral feet.  No open lesions present.    Musculoskeletal exam: Musculature intact with dorsiflexion, plantarflexion, inversion, eversion. Ankle and First MPJ joint range of motion normal.   ***  Assessment: ***  Plan: ***

## 2022-05-31 DIAGNOSIS — E039 Hypothyroidism, unspecified: Secondary | ICD-10-CM | POA: Diagnosis not present

## 2022-05-31 DIAGNOSIS — E1142 Type 2 diabetes mellitus with diabetic polyneuropathy: Secondary | ICD-10-CM | POA: Diagnosis not present

## 2022-06-10 DIAGNOSIS — J45909 Unspecified asthma, uncomplicated: Secondary | ICD-10-CM | POA: Diagnosis not present

## 2022-06-30 DIAGNOSIS — E039 Hypothyroidism, unspecified: Secondary | ICD-10-CM | POA: Diagnosis not present

## 2022-06-30 DIAGNOSIS — E1142 Type 2 diabetes mellitus with diabetic polyneuropathy: Secondary | ICD-10-CM | POA: Diagnosis not present

## 2022-07-10 DIAGNOSIS — J45909 Unspecified asthma, uncomplicated: Secondary | ICD-10-CM | POA: Diagnosis not present

## 2022-07-17 ENCOUNTER — Other Ambulatory Visit: Payer: Self-pay | Admitting: Cardiology

## 2022-07-31 DIAGNOSIS — E1142 Type 2 diabetes mellitus with diabetic polyneuropathy: Secondary | ICD-10-CM | POA: Diagnosis not present

## 2022-07-31 DIAGNOSIS — E039 Hypothyroidism, unspecified: Secondary | ICD-10-CM | POA: Diagnosis not present

## 2022-08-10 DIAGNOSIS — J45909 Unspecified asthma, uncomplicated: Secondary | ICD-10-CM | POA: Diagnosis not present

## 2022-08-14 DIAGNOSIS — I129 Hypertensive chronic kidney disease with stage 1 through stage 4 chronic kidney disease, or unspecified chronic kidney disease: Secondary | ICD-10-CM | POA: Diagnosis not present

## 2022-08-14 DIAGNOSIS — E1169 Type 2 diabetes mellitus with other specified complication: Secondary | ICD-10-CM | POA: Diagnosis not present

## 2022-08-14 DIAGNOSIS — E039 Hypothyroidism, unspecified: Secondary | ICD-10-CM | POA: Diagnosis not present

## 2022-08-19 DIAGNOSIS — I129 Hypertensive chronic kidney disease with stage 1 through stage 4 chronic kidney disease, or unspecified chronic kidney disease: Secondary | ICD-10-CM | POA: Diagnosis not present

## 2022-08-19 DIAGNOSIS — E1169 Type 2 diabetes mellitus with other specified complication: Secondary | ICD-10-CM | POA: Diagnosis not present

## 2022-08-19 DIAGNOSIS — E782 Mixed hyperlipidemia: Secondary | ICD-10-CM | POA: Diagnosis not present

## 2022-08-19 DIAGNOSIS — E039 Hypothyroidism, unspecified: Secondary | ICD-10-CM | POA: Diagnosis not present

## 2022-08-19 DIAGNOSIS — Z23 Encounter for immunization: Secondary | ICD-10-CM | POA: Diagnosis not present

## 2022-08-19 DIAGNOSIS — N183 Chronic kidney disease, stage 3 unspecified: Secondary | ICD-10-CM | POA: Diagnosis not present

## 2022-08-27 ENCOUNTER — Ambulatory Visit: Payer: Medicare Other | Admitting: Podiatry

## 2022-08-27 DIAGNOSIS — E1142 Type 2 diabetes mellitus with diabetic polyneuropathy: Secondary | ICD-10-CM | POA: Diagnosis not present

## 2022-08-27 DIAGNOSIS — L84 Corns and callosities: Secondary | ICD-10-CM | POA: Diagnosis not present

## 2022-08-27 DIAGNOSIS — B351 Tinea unguium: Secondary | ICD-10-CM | POA: Diagnosis not present

## 2022-08-27 DIAGNOSIS — M79674 Pain in right toe(s): Secondary | ICD-10-CM | POA: Diagnosis not present

## 2022-08-27 DIAGNOSIS — M79675 Pain in left toe(s): Secondary | ICD-10-CM | POA: Diagnosis not present

## 2022-08-27 NOTE — Progress Notes (Signed)
  Subjective:  Patient ID: [name] [name], female    DOB: January 02, 1941,  MRN: [id number]  Chief Complaint  Patient presents with   Nail Problem    Nail Trim    Callouses    Callous on right foot     81 y.o. female presents with the above complaint. History confirmed with patient. Patient presenting with pain related to dystrophic thickened elongated nails. Patient is unable to trim own nails related to nail dystrophy and/or mobility issues. Patient does  have a history of T2DM. She aslo has painful callus present plantar aspect of the bilateral 1st met head.   Objective:  Physical Exam: warm, good capillary refill nail exam onychomycosis of the toenails, onycholysis, and dystrophic nails DP pulses palpable, PT pulses palpable, and protective sensation absent Left Foot:  Pain with palpation of nails due to elongation and dystrophic growth. Hyperkeratotic lesion present sub 1st met head, no underlying ulceration Right Foot: Pain with palpation of nails due to elongation and dystrophic growth. Hyperkeratotic lesion present sub 1st met head, no underlying ulceration  Assessment:   1. Pain due to onychomycosis of toenails of both feet   2. Pre-ulcerative calluses   3. Diabetic polyneuropathy associated with type 2 diabetes mellitus ([place])      Plan:  Patient was evaluated and treated and all questions answered.  #pre ulcerative callus sub 1st met head bilateral All symptomatic hyperkeratoses x2 were safely debrided with a sterile #15 blade to patient's level of comfort without incident. We discussed preventative and palliative care of these lesions including supportive and accommodative shoegear, padding, prefabricated and custom molded accommodative orthoses, use of a pumice stone and lotions/creams daily.   #Onychomycosis with pain  -Nails palliatively debrided as below. -Educated on self-care  Procedure: Nail Debridement Rationale: Pain Type of Debridement: manual,  sharp debridement. Instrumentation: Nail nipper, rotary burr. Number of Nails: 10  Return in about 10 weeks (around 11/05/2022) for [facility].         Everitt Amber, DPM [facility]

## 2022-08-28 DIAGNOSIS — Z139 Encounter for screening, unspecified: Secondary | ICD-10-CM | POA: Diagnosis not present

## 2022-08-29 DIAGNOSIS — E1169 Type 2 diabetes mellitus with other specified complication: Secondary | ICD-10-CM | POA: Diagnosis not present

## 2022-08-29 DIAGNOSIS — I129 Hypertensive chronic kidney disease with stage 1 through stage 4 chronic kidney disease, or unspecified chronic kidney disease: Secondary | ICD-10-CM | POA: Diagnosis not present

## 2022-08-30 DIAGNOSIS — I129 Hypertensive chronic kidney disease with stage 1 through stage 4 chronic kidney disease, or unspecified chronic kidney disease: Secondary | ICD-10-CM | POA: Diagnosis not present

## 2022-08-30 DIAGNOSIS — E1169 Type 2 diabetes mellitus with other specified complication: Secondary | ICD-10-CM | POA: Diagnosis not present

## 2022-09-09 DIAGNOSIS — J45909 Unspecified asthma, uncomplicated: Secondary | ICD-10-CM | POA: Diagnosis not present

## 2022-09-28 DIAGNOSIS — E1169 Type 2 diabetes mellitus with other specified complication: Secondary | ICD-10-CM | POA: Diagnosis not present

## 2022-09-28 DIAGNOSIS — I129 Hypertensive chronic kidney disease with stage 1 through stage 4 chronic kidney disease, or unspecified chronic kidney disease: Secondary | ICD-10-CM | POA: Diagnosis not present

## 2022-11-05 ENCOUNTER — Ambulatory Visit: Payer: Self-pay | Admitting: Podiatry

## 2022-11-15 ENCOUNTER — Ambulatory Visit: Payer: Medicare HMO | Admitting: Podiatry

## 2022-11-15 DIAGNOSIS — B351 Tinea unguium: Secondary | ICD-10-CM

## 2022-11-15 DIAGNOSIS — M79674 Pain in right toe(s): Secondary | ICD-10-CM | POA: Diagnosis not present

## 2022-11-15 DIAGNOSIS — M79675 Pain in left toe(s): Secondary | ICD-10-CM | POA: Diagnosis not present

## 2022-11-15 DIAGNOSIS — L84 Corns and callosities: Secondary | ICD-10-CM | POA: Diagnosis not present

## 2022-11-15 DIAGNOSIS — E1142 Type 2 diabetes mellitus with diabetic polyneuropathy: Secondary | ICD-10-CM

## 2022-11-15 NOTE — Progress Notes (Signed)
  Subjective:  Patient ID: [name] [name], female    DOB: 1941-04-11,  MRN: [id number]  Chief Complaint  Patient presents with   Nail Problem    Diabetic Foot Care     82 y.o. female presents with the above complaint. History confirmed with patient. Patient presenting with pain related to dystrophic thickened elongated nails. Patient is unable to trim own nails related to nail dystrophy and/or mobility issues. Patient does  have a history of T2DM. She aslo has painful callus present plantar aspect of the bilateral 1st met head.   Objective:  Physical Exam: warm, good capillary refill nail exam onychomycosis of the toenails, onycholysis, and dystrophic nails DP pulses palpable, PT pulses palpable, and protective sensation absent Left Foot:  Pain with palpation of nails due to elongation and dystrophic growth. Hyperkeratotic lesion present sub 1st met head, no underlying ulceration Right Foot: Pain with palpation of nails due to elongation and dystrophic growth. Hyperkeratotic lesion present sub 1st met head, no underlying ulceration  Assessment:   1. Pain due to onychomycosis of toenails of both feet   2. Pre-ulcerative calluses   3. Diabetic polyneuropathy associated with type 2 diabetes mellitus ([place])       Plan:  Patient was evaluated and treated and all questions answered.  #pre ulcerative callus sub 1st met head bilateral All symptomatic hyperkeratoses x2 were safely debrided with a sterile #15 blade to patient's level of comfort without incident. We discussed preventative and palliative care of these lesions including supportive and accommodative shoegear, padding, prefabricated and custom molded accommodative orthoses, use of a pumice stone and lotions/creams daily.   #Onychomycosis with pain  -Nails palliatively debrided as below. -Educated on self-care  Procedure: Nail Debridement Rationale: Pain Type of Debridement: manual, sharp  debridement. Instrumentation: Nail nipper, rotary burr. Number of Nails: 10  Return in about 9 weeks (around 01/17/2023) for [facility]         Everitt Amber, DPM [facility] [place] [facility]

## 2022-11-21 ENCOUNTER — Emergency Department (HOSPITAL_COMMUNITY): Payer: Medicare HMO

## 2022-11-21 ENCOUNTER — Ambulatory Visit: Payer: Medicare HMO | Attending: Cardiology | Admitting: Cardiology

## 2022-11-21 ENCOUNTER — Other Ambulatory Visit: Payer: Self-pay

## 2022-11-21 ENCOUNTER — Encounter: Payer: Self-pay | Admitting: Cardiology

## 2022-11-21 ENCOUNTER — Observation Stay (HOSPITAL_COMMUNITY): Payer: Medicare HMO

## 2022-11-21 ENCOUNTER — Inpatient Hospital Stay (HOSPITAL_COMMUNITY)
Admission: EM | Admit: 2022-11-21 | Discharge: 2022-11-23 | DRG: 243 | Disposition: A | Discharge status: Home / Self Care | Payer: Medicare HMO | Attending: Cardiovascular Disease | Admitting: Cardiovascular Disease

## 2022-11-21 VITALS — BP 162/70 | HR 36 | Ht 64.0 in | Wt 199.0 lb

## 2022-11-21 DIAGNOSIS — N183 Chronic kidney disease, stage 3 unspecified: Secondary | ICD-10-CM

## 2022-11-21 DIAGNOSIS — J449 Chronic obstructive pulmonary disease, unspecified: Secondary | ICD-10-CM | POA: Diagnosis present

## 2022-11-21 DIAGNOSIS — R001 Bradycardia, unspecified: Secondary | ICD-10-CM | POA: Diagnosis not present

## 2022-11-21 DIAGNOSIS — Z880 Allergy status to penicillin: Secondary | ICD-10-CM

## 2022-11-21 DIAGNOSIS — E782 Mixed hyperlipidemia: Secondary | ICD-10-CM | POA: Diagnosis present

## 2022-11-21 DIAGNOSIS — I442 Atrioventricular block, complete: Secondary | ICD-10-CM | POA: Insufficient documentation

## 2022-11-21 DIAGNOSIS — Z833 Family history of diabetes mellitus: Secondary | ICD-10-CM

## 2022-11-21 DIAGNOSIS — H9190 Unspecified hearing loss, unspecified ear: Secondary | ICD-10-CM | POA: Diagnosis present

## 2022-11-21 DIAGNOSIS — E1169 Type 2 diabetes mellitus with other specified complication: Secondary | ICD-10-CM

## 2022-11-21 DIAGNOSIS — Z9011 Acquired absence of right breast and nipple: Secondary | ICD-10-CM

## 2022-11-21 DIAGNOSIS — I083 Combined rheumatic disorders of mitral, aortic and tricuspid valves: Secondary | ICD-10-CM | POA: Diagnosis present

## 2022-11-21 DIAGNOSIS — I350 Nonrheumatic aortic (valve) stenosis: Secondary | ICD-10-CM

## 2022-11-21 DIAGNOSIS — E1122 Type 2 diabetes mellitus with diabetic chronic kidney disease: Secondary | ICD-10-CM | POA: Diagnosis present

## 2022-11-21 DIAGNOSIS — Z888 Allergy status to other drugs, medicaments and biological substances status: Secondary | ICD-10-CM

## 2022-11-21 DIAGNOSIS — I129 Hypertensive chronic kidney disease with stage 1 through stage 4 chronic kidney disease, or unspecified chronic kidney disease: Secondary | ICD-10-CM | POA: Diagnosis not present

## 2022-11-21 DIAGNOSIS — Z79890 Hormone replacement therapy (postmenopausal): Secondary | ICD-10-CM

## 2022-11-21 DIAGNOSIS — E039 Hypothyroidism, unspecified: Secondary | ICD-10-CM | POA: Diagnosis present

## 2022-11-21 DIAGNOSIS — I5032 Chronic diastolic (congestive) heart failure: Secondary | ICD-10-CM | POA: Diagnosis present

## 2022-11-21 DIAGNOSIS — Z853 Personal history of malignant neoplasm of breast: Secondary | ICD-10-CM

## 2022-11-21 DIAGNOSIS — I130 Hypertensive heart and chronic kidney disease with heart failure and stage 1 through stage 4 chronic kidney disease, or unspecified chronic kidney disease: Secondary | ICD-10-CM | POA: Diagnosis present

## 2022-11-21 DIAGNOSIS — N1831 Chronic kidney disease, stage 3a: Secondary | ICD-10-CM | POA: Diagnosis present

## 2022-11-21 DIAGNOSIS — E1142 Type 2 diabetes mellitus with diabetic polyneuropathy: Secondary | ICD-10-CM | POA: Diagnosis present

## 2022-11-21 DIAGNOSIS — Z79899 Other long term (current) drug therapy: Secondary | ICD-10-CM

## 2022-11-21 DIAGNOSIS — Z96651 Presence of right artificial knee joint: Secondary | ICD-10-CM | POA: Diagnosis present

## 2022-11-21 DIAGNOSIS — Z8249 Family history of ischemic heart disease and other diseases of the circulatory system: Secondary | ICD-10-CM

## 2022-11-21 DIAGNOSIS — Z820 Family history of epilepsy and other diseases of the nervous system: Secondary | ICD-10-CM

## 2022-11-21 DIAGNOSIS — Z7982 Long term (current) use of aspirin: Secondary | ICD-10-CM

## 2022-11-21 LAB — CBC WITH DIFFERENTIAL/PLATELET
Abs Immature Granulocytes: 0.01 10*3/uL (ref 0.00–0.07)
Basophils Absolute: 0 10*3/uL (ref 0.0–0.1)
Basophils Relative: 1 %
Eosinophils Absolute: 0.2 10*3/uL (ref 0.0–0.5)
Eosinophils Relative: 4 %
HCT: 47.2 % — ABNORMAL HIGH (ref 36.0–46.0)
Hemoglobin: 15.2 g/dL — ABNORMAL HIGH (ref 12.0–15.0)
Immature Granulocytes: 0 %
Lymphocytes Relative: 37 %
Lymphs Abs: 1.5 10*3/uL (ref 0.7–4.0)
MCH: 32.6 pg (ref 26.0–34.0)
MCHC: 32.2 g/dL (ref 30.0–36.0)
MCV: 101.3 fL — ABNORMAL HIGH (ref 80.0–100.0)
Monocytes Absolute: 0.6 10*3/uL (ref 0.1–1.0)
Monocytes Relative: 16 %
Neutro Abs: 1.7 10*3/uL (ref 1.7–7.7)
Neutrophils Relative %: 42 %
Platelets: 103 10*3/uL — ABNORMAL LOW (ref 150–400)
RBC: 4.66 MIL/uL (ref 3.87–5.11)
RDW: 14.6 % (ref 11.5–15.5)
WBC: 4.1 10*3/uL (ref 4.0–10.5)
nRBC: 0 % (ref 0.0–0.2)

## 2022-11-21 LAB — ECHOCARDIOGRAM COMPLETE
AR max vel: 0.92 cm2
AV Area VTI: 0.9 cm2
AV Area mean vel: 0.9 cm2
AV Mean grad: 33 mmHg
AV Peak grad: 51.8 mmHg
Ao pk vel: 3.6 m/s
Area-P 1/2: 2.75 cm2
Est EF: 65
Height: 63.5 in
S' Lateral: 2.1 cm
Weight: 3184 oz

## 2022-11-21 LAB — PROTIME-INR
INR: 1 (ref 0.8–1.2)
Prothrombin Time: 13.3 seconds (ref 11.4–15.2)

## 2022-11-21 LAB — BASIC METABOLIC PANEL
Anion gap: 9 (ref 5–15)
BUN: 21 mg/dL (ref 8–23)
CO2: 25 mmol/L (ref 22–32)
Calcium: 9.1 mg/dL (ref 8.9–10.3)
Chloride: 106 mmol/L (ref 98–111)
Creatinine, Ser: 1.23 mg/dL — ABNORMAL HIGH (ref 0.44–1.00)
GFR, Estimated: 45 mL/min — ABNORMAL LOW (ref >60)
Glucose, Bld: 111 mg/dL — ABNORMAL HIGH (ref 70–99)
Potassium: 3.9 mmol/L (ref 3.5–5.1)
Sodium: 140 mmol/L (ref 135–145)

## 2022-11-21 LAB — MAGNESIUM: Magnesium: 2.6 mg/dL — ABNORMAL HIGH (ref 1.7–2.4)

## 2022-11-21 LAB — HEMOGLOBIN A1C
Hgb A1c MFr Bld: 5.9 % — ABNORMAL HIGH (ref 4.8–5.6)
Mean Plasma Glucose: 122.63 mg/dL

## 2022-11-21 LAB — SURGICAL PCR SCREEN
MRSA, PCR: NEGATIVE
Staphylococcus aureus: NEGATIVE

## 2022-11-21 LAB — GLUCOSE, CAPILLARY
Glucose-Capillary: 82 mg/dL (ref 70–99)
Glucose-Capillary: 108 mg/dL — ABNORMAL HIGH (ref 70–99)

## 2022-11-21 LAB — TROPONIN I (HIGH SENSITIVITY)
Troponin I (High Sensitivity): 25 ng/L — ABNORMAL HIGH (ref <18)
Troponin I (High Sensitivity): 26 ng/L — ABNORMAL HIGH (ref <18)

## 2022-11-21 MED ORDER — DAPAGLIFLOZIN PROPANEDIOL 10 MG PO TABS: 10.0000 mg | ORAL_TABLET | Freq: Every day | ORAL | Status: DC

## 2022-11-21 MED ORDER — DAPAGLIFLOZIN PROPANEDIOL 10 MG PO TABS
10.0000 mg | ORAL_TABLET | Freq: Every day | ORAL | Status: DC
  Administered 2022-11-21 – 2022-11-23 (×3): 10 mg via ORAL
  Filled 2022-11-21 (×3): qty 1

## 2022-11-21 MED ORDER — CHLORHEXIDINE GLUCONATE 4 % EX LIQD
60.0000 mL | Freq: Once | CUTANEOUS | Status: AC
  Administered 2022-11-21: 4 via TOPICAL
  Filled 2022-11-21: qty 60

## 2022-11-21 MED ORDER — FLUTICASONE FUROATE-VILANTEROL 100-25 MCG/ACT IN AEPB
1.0000 | INHALATION_SPRAY | Freq: Every day | RESPIRATORY_TRACT | Status: DC
  Administered 2022-11-22 – 2022-11-23 (×2): 1 via RESPIRATORY_TRACT
  Filled 2022-11-21 (×2): qty 28

## 2022-11-21 MED ORDER — LEVOTHYROXINE SODIUM 88 MCG PO TABS: 88.0000 ug | ORAL_TABLET | Freq: Every day | ORAL | Status: DC

## 2022-11-21 MED ORDER — ALBUTEROL SULFATE HFA 108 (90 BASE) MCG/ACT IN AERS: 2.0000 | INHALATION_SPRAY | Freq: Four times a day (QID) | RESPIRATORY_TRACT | Status: DC | PRN

## 2022-11-21 MED ORDER — LEVOTHYROXINE SODIUM 88 MCG PO TABS
88.0000 ug | ORAL_TABLET | Freq: Every day | ORAL | Status: DC
  Administered 2022-11-22 – 2022-11-23 (×2): 88 ug via ORAL
  Filled 2022-11-21 (×2): qty 1

## 2022-11-21 MED ORDER — SODIUM CHLORIDE 0.9% FLUSH
3.0000 mL | Freq: Two times a day (BID) | INTRAVENOUS | Status: DC
  Administered 2022-11-21 – 2022-11-22 (×3): 3 mL via INTRAVENOUS

## 2022-11-21 MED ORDER — GABAPENTIN 100 MG PO CAPS
100.0000 mg | ORAL_CAPSULE | Freq: Two times a day (BID) | ORAL | Status: DC
  Administered 2022-11-21 – 2022-11-23 (×4): 100 mg via ORAL
  Filled 2022-11-21 (×4): qty 1

## 2022-11-21 MED ORDER — ONDANSETRON HCL 4 MG/2ML IJ SOLN: 4.0000 mg | Freq: Four times a day (QID) | INTRAMUSCULAR | Status: DC | PRN

## 2022-11-21 MED ORDER — ALBUTEROL SULFATE (2.5 MG/3ML) 0.083% IN NEBU: 2.5000 mg | INHALATION_SOLUTION | Freq: Four times a day (QID) | RESPIRATORY_TRACT | Status: DC | PRN

## 2022-11-21 MED ORDER — SODIUM CHLORIDE 0.9 % IV SOLN: INTRAVENOUS | Status: DC

## 2022-11-21 MED ORDER — SODIUM CHLORIDE 0.9 % IV SOLN
80.0000 mg | INTRAVENOUS | Status: AC
  Administered 2022-11-22: 80 mg
  Filled 2022-11-21: qty 2

## 2022-11-21 MED ORDER — SODIUM CHLORIDE 0.9 % IV SOLN: 250.0000 mL | INTRAVENOUS | Status: DC

## 2022-11-21 MED ORDER — ROSUVASTATIN CALCIUM 5 MG PO TABS
10.0000 mg | ORAL_TABLET | Freq: Every day | ORAL | Status: DC
  Administered 2022-11-21 – 2022-11-23 (×3): 10 mg via ORAL
  Filled 2022-11-21 (×3): qty 2

## 2022-11-21 MED ORDER — SODIUM CHLORIDE 0.9% FLUSH: 3.0000 mL | INTRAVENOUS | Status: DC | PRN

## 2022-11-21 MED ORDER — VANCOMYCIN HCL IN DEXTROSE 1-5 GM/200ML-% IV SOLN
1000.0000 mg | INTRAVENOUS | Status: AC
  Administered 2022-11-22: 1000 mg via INTRAVENOUS

## 2022-11-21 MED ORDER — FLUTICASONE PROPIONATE 50 MCG/ACT NA SUSP
1.0000 | Freq: Every day | NASAL | Status: DC
  Administered 2022-11-21 – 2022-11-22 (×2): 1 via NASAL
  Administered 2022-11-23: 2 via NASAL
  Filled 2022-11-21: qty 16

## 2022-11-21 MED ORDER — INSULIN ASPART 100 UNIT/ML IJ SOLN
0.0000 [IU] | Freq: Three times a day (TID) | INTRAMUSCULAR | Status: DC
  Administered 2022-11-23: 1 [IU] via SUBCUTANEOUS

## 2022-11-21 MED ORDER — CHLORHEXIDINE GLUCONATE 4 % EX LIQD
60.0000 mL | Freq: Once | CUTANEOUS | Status: DC
  Filled 2022-11-21: qty 60

## 2022-11-21 MED ORDER — ACETAMINOPHEN 325 MG PO TABS: 650.0000 mg | ORAL_TABLET | ORAL | Status: DC | PRN

## 2022-11-21 MED ORDER — UMECLIDINIUM BROMIDE 62.5 MCG/ACT IN AEPB
1.0000 | INHALATION_SPRAY | Freq: Every day | RESPIRATORY_TRACT | Status: DC
  Administered 2022-11-22 – 2022-11-23 (×2): 1 via RESPIRATORY_TRACT
  Filled 2022-11-21 (×2): qty 7

## 2022-11-21 NOTE — Progress Notes (Signed)
Cardiology Office Note:    Date:  11/21/2022   ID:  [name] [name], [name] [name] 12/25/1940, MRN [id number]  PCP:  Marco Collie, MD  Cardiologist:  Jenne Campus, MD    Referring MD: Marco Collie, MD   No chief complaint on file.   History of Present Illness:    [name] [name] is a 82 y.o. female with past medical history significant for aortic stenosis, last assessment on 04/23/2022 when her aortic valve area was 1.5 cm with mean gradient of 26.5 mmHg, essential hypertension, COPD, chronic kidney failure.  She was referred to [place] because of slow heart rate.  Her EKG today show complete heart block, ventricular escape mechanism at 36 bpm, however it looks like junctional escape which she has right now QRS complex morphology is identical to her QRS complex that she had with normal AV conduction which is right bundle branch block left anterior fascicular block.  [name] is asymptomatic she said she get dizzy sometimes but denies having syncope.  She said she is still able to perform activitiesLiving but she is much weaker much more tired.  There is no palpitations no swelling of lower extremities.  Denies have any chest pain tightness squeezing pressure burning chest  Past Medical History:  Diagnosis Date   Aortic valve stenosis, moderate    Arthritis    Asthma    Benign hypertension with CKD (chronic kidney disease) stage III (HCC)    COPD (chronic obstructive pulmonary disease) (HCC)    Diabetic peripheral neuropathy (HCC)    Diastolic congestive heart failure ([place])    DM type 2 with diabetic mixed hyperlipidemia (HCC)    Hearing loss    History of breast cancer    HLD (hyperlipidemia)    HTN (hypertension)    Hypothyroid    Intraductal carcinoma in situ of right breast 04/27/2014   Mitral valve stenosis    Morbid obesity (HCC)    Muscle pain    Osteopenia after menopause 07/11/2017   Polyposis associated with heterozygous mutation in MUTYH gene 01/03/2022    Sinus problem    Swelling    Tubular adenoma of colon 01/03/2022   Multiple, MUTYH mutation    Past Surgical History:  Procedure Laterality Date   BACK SURGERY  02/14/2014   MASTECTOMY Right 2015   PARTIAL HYSTERECTOMY  1980   TONSILLECTOMY     TOTAL KNEE ARTHROPLASTY Right 02/07/2011    Current Medications: Current Meds  Medication Sig   Acetaminophen (TYLENOL 8 HOUR ARTHRITIS PAIN PO) Take 1-2 tablets by mouth as needed for pain.   albuterol (VENTOLIN HFA) 108 (90 Base) MCG/ACT inhaler Inhale 2 puffs into the lungs every 6 (six) hours as needed for wheezing or shortness of breath.   aspirin 81 MG EC tablet Take 81 mg by mouth daily. Swallow whole.   calcium carbonate (OSCAL) 1500 (600 Ca) MG TABS tablet Take 1 tablet by mouth daily with breakfast.   FARXIGA 10 MG TABS tablet Take 10 mg by mouth daily.   fluticasone (FLONASE) 50 MCG/ACT nasal spray Place 1-2 sprays into both nostrils as needed for allergies or rhinitis.   Fluticasone-Umeclidin-Vilant (TRELEGY ELLIPTA) 100-62.5-25 MCG/ACT AEPB Inhale 1 puff into the lungs daily.   furosemide (LASIX) 40 MG tablet Take 1 tablet (40 mg total) by mouth daily AND 1 tablet (40 mg total) every other day.   gabapentin (NEURONTIN) 100 MG capsule Take 100 mg by mouth 2 (two) times daily.   hydrocortisone cream 1 % Apply  1 application  topically daily as needed for itching.   levothyroxine (SYNTHROID) 88 MCG tablet Take 88 mcg by mouth daily before breakfast.   loratadine (CLARITIN) 10 MG tablet Take 10 mg by mouth daily.   Magnesium 250 MG TABS Take 250 mg by mouth daily.   OXYGEN Inhale 2 L into the lungs at bedtime.   polymixin-bacitracin (POLYSPORIN) 500-10000 UNIT/GM OINT ointment Apply 1 application  topically 2 (two) times daily.   potassium chloride (KLOR-CON) 10 MEQ tablet Take 1 tablet (10 mEq total) by mouth daily.   rosuvastatin (CRESTOR) 10 MG tablet Take 1 tablet (10 mg total) by mouth daily.     Allergies:   Darvon  [propoxyphene] and Penicillins   Social History   Socioeconomic History   Marital status: Widowed    Spouse name: Not on file   Number of children: 3   Years of education: Not on file   Highest education level: Not on file  Occupational History   Occupation: bakery    Employer: [id number]  Tobacco Use   Smoking status: Never    Passive exposure: Never   Smokeless tobacco: Never  Vaping Use   Vaping Use: Never used  Substance and Sexual Activity   Alcohol use: Yes    Comment: Minimal amount   Drug use: No   Sexual activity: Not on file  Other Topics Concern   Not on file  Social History Narrative   Not on file   Social Determinants of Health   Financial Resource Strain: Not on file  Food Insecurity: Not on file  Transportation Needs: Not on file  Physical Activity: Not on file  Stress: Not on file  Social Connections: Not on file     Family History: The patient's family history includes Alzheimer's disease in an other family member; Breast cancer in her sister; Diabetes Mellitus I in her mother; Heart disease in her mother. ROS:   Please see the history of present illness.    All 14 point review of systems negative except as described per history of present illness  EKGs/Labs/Other Studies Reviewed:      Recent Labs: No results found for requested labs within last 365 days.  Recent Lipid Panel No results found for: "CHOL", "TRIG", "HDL", "CHOLHDL", "VLDL", "LDLCALC", "LDLDIRECT"  Physical Exam:    VS:  BP (!) 162/70 (BP Location: Left Arm, Patient Position: Sitting)   Pulse (!) 36   Ht 5' 4"$  (1.626 m)   Wt 199 lb (90.3 kg)   SpO2 98%   BMI 34.16 kg/m     Wt Readings from Last 3 Encounters:  11/21/22 199 lb (90.3 kg)  04/30/22 202 lb 3.2 oz (91.7 kg)  12/17/21 209 lb 6.4 oz (95 kg)     GEN:  Well nourished, well developed in no acute distress HEENT: Normal NECK: No JVD; No carotid bruits LYMPHATICS: No lymphadenopathy CARDIAC: Bradycardic, RRR,  systolic ejection murmur grade 3 over right upper portion of the sternal mid-to-late peaking, no rubs, no gallops RESPIRATORY:  Clear to auscultation without rales, wheezing or rhonchi  ABDOMEN: Soft, non-tender, non-distended MUSCULOSKELETAL:  No edema; No deformity  SKIN: Warm and dry LOWER EXTREMITIES: no swelling NEUROLOGIC:  Alert and oriented x 3 PSYCHIATRIC:  Normal affect   ASSESSMENT:    1. Heart block AV complete ([place])   2. Aortic valve stenosis, moderate   3. Benign hypertension with CKD (chronic kidney disease) stage III ([place])   4. DM type 2 with diabetic mixed hyperlipidemia (  [place])    PLAN:    In order of problems listed above:  Complete heart block.  Likely patient does not have episode of syncope, sometimes dizziness.  She is not on any AV blocking agent, she needs to be paced.  She will be transferred to [facility] for pacemaker implantation.  I explained procedure to her including all risk benefits as well as alternatives.  Likely her escape mechanism look like it is junctional with baseline QRS complex morphology indicating right bundle branch block with left anterior hemiblock which is her baseline normal QRS complex. Aortic stenosis last evaluation in summer of last year obviously need to be repeated.  Most likely etiology of her complete heart block is aortic stenosis and calcification of the AV node.  Will continue monitoring.  At least in the summer she did not meet criteria for any valve surgery. Essential hypertension blood pressure elevated.  Will treat accordingly will avoid obviously an AV blocking agent. Diabetes that being followed by internal medicine team last hemoglobin A1c I have is from November of last year which was 6.0. Dyslipidemia I did review K PN which show me her LDL 79 HDL 45 this is from November of last year.  She is taking Crestor 10 which is moderate intensity statin that I will continue.  Ambulance has been summoned patient will be transferred  [facility] for pacemaker implantation   Medication Adjustments/Labs and Tests Ordered: Current medicines are reviewed at length with the patient today.  Concerns regarding medicines are outlined above.  No orders of the defined types were placed in this encounter.  Medication changes: No orders of the defined types were placed in this encounter.   Signed, Park Liter, MD, [facility] 11/21/2022 10:22 AM    Circle

## 2022-11-21 NOTE — ED Notes (Signed)
ED TO INPATIENT HANDOFF REPORT  ED Nurse Name and Phone #:  [place] RN [phone]  S Name/Age/Gender [name] [name] 82 y.o. female Room/Bed: RESUSC/RESUSC  Code Status   Code Status: Full Code  Home/SNF/Other Home Patient oriented to: self, place, time, and situation Is this baseline? Yes   Triage Complete: Triage complete  Chief Complaint Complete heart block [facility]) [I44.2]  Triage Note Pt BIB [name] from the [place] office. Chief complaint of Bradycardia. Patient reports feeling weak for about a week, known history of bradycardia. 35-38 HR. A-symptomatic. Pressure [id number] systolic. 20# L AC   Allergies Allergies  Allergen Reactions   Darvon [Propoxyphene] Swelling   Penicillins Swelling    Level of Care/Admitting Diagnosis ED Disposition     ED Disposition  Admit   Condition  --   Comment  Hospital Area: [facility] [id number]  Level of Care: Telemetry Cardiac [[id number]]  May place patient in observation at [facility] or [facility] if equivalent level of care is available:: No  Covid Evaluation: Asymptomatic - no recent exposure (last 10 days) testing not required  Diagnosis: Complete heart block [facility]) [id number]  Admitting Physician: Melida Quitter [id number]  Attending Physician: Melida Quitter [id number]          B Medical/Surgery History Past Medical History:  Diagnosis Date   Aortic valve stenosis, moderate    Arthritis    Asthma    Benign hypertension with CKD (chronic kidney disease) stage III (HCC)    COPD (chronic obstructive pulmonary disease) ([place])    Diabetic peripheral neuropathy (HCC)    Diastolic congestive heart failure ([place])    DM type 2 with diabetic mixed hyperlipidemia (HCC)    Hearing loss    History of breast cancer    HLD (hyperlipidemia)    HTN (hypertension)    Hypothyroid    Intraductal carcinoma in situ of right breast 04/27/2014   Mitral valve stenosis    Morbid obesity ([place])    Muscle  pain    Osteopenia after menopause 07/11/2017   Polyposis associated with heterozygous mutation in MUTYH gene 01/03/2022   Sinus problem    Swelling    Tubular adenoma of colon 01/03/2022   Multiple, MUTYH mutation   Past Surgical History:  Procedure Laterality Date   BACK SURGERY  02/14/2014   MASTECTOMY Right 2015   PARTIAL HYSTERECTOMY  1980   TONSILLECTOMY     TOTAL KNEE ARTHROPLASTY Right 02/07/2011     A IV Location/Drains/Wounds Patient Lines/Drains/Airways Status     Active Line/Drains/Airways     Name Placement date Placement time Site Days   Peripheral IV 11/21/22 20 G Right Antecubital 11/21/22  1144  Antecubital  less than 1            Intake/Output Last 24 hours No intake or output data in the 24 hours ending 11/21/22 1519  Labs/Imaging Results for orders placed or performed during the hospital encounter of 11/21/22 (from the past 48 hour(s))  Basic metabolic panel     Status: Abnormal   Collection Time: 11/21/22 11:48 AM  Result Value Ref Range   Sodium 140 135 - 145 mmol/L   Potassium 3.9 3.5 - 5.1 mmol/L   Chloride 106 98 - 111 mmol/L   CO2 25 22 - 32 mmol/L   Glucose, Bld 111 (H) 70 - 99 mg/dL    Comment: Glucose reference range applies only to samples taken after fasting for at least 8 hours.  BUN 21 8 - 23 mg/dL   Creatinine, Ser 1.23 (H) 0.44 - 1.00 mg/dL   Calcium 9.1 8.9 - 10.3 mg/dL   GFR, Estimated 45 (L) >60 mL/min    Comment: (NOTE) Calculated using the CKD-EPI Creatinine Equation (2021)    Anion gap 9 5 - 15    Comment: Performed at [facility] [address] [place], [place] [address]  Troponin I (High Sensitivity)     Status: Abnormal   Collection Time: 11/21/22 11:48 AM  Result Value Ref Range   Troponin I (High Sensitivity) 25 (H) <18 ng/L    Comment: (NOTE) Elevated high sensitivity troponin I (hsTnI) values and significant  changes across serial measurements may suggest ACS but many other  chronic and acute  conditions are known to elevate hsTnI results.  Refer to the "Links" section for chest pain algorithms and additional  guidance. Performed at [facility], [place] [address] [place], [place] [address]   CBC with Differential     Status: Abnormal   Collection Time: 11/21/22 11:48 AM  Result Value Ref Range   WBC 4.1 4.0 - 10.5 K/uL   RBC 4.66 3.87 - 5.11 MIL/uL   Hemoglobin 15.2 (H) 12.0 - 15.0 g/dL   HCT 47.2 (H) 36.0 - 46.0 %   MCV 101.3 (H) 80.0 - 100.0 fL   MCH 32.6 26.0 - 34.0 pg   MCHC 32.2 30.0 - 36.0 g/dL   RDW 14.6 11.5 - 15.5 %   Platelets 103 (L) 150 - 400 K/uL    Comment: REPEATED TO VERIFY   nRBC 0.0 0.0 - 0.2 %   Neutrophils Relative % 42 %   Neutro Abs 1.7 1.7 - 7.7 K/uL   Lymphocytes Relative 37 %   Lymphs Abs 1.5 0.7 - 4.0 K/uL   Monocytes Relative 16 %   Monocytes Absolute 0.6 0.1 - 1.0 K/uL   Eosinophils Relative 4 %   Eosinophils Absolute 0.2 0.0 - 0.5 K/uL   Basophils Relative 1 %   Basophils Absolute 0.0 0.0 - 0.1 K/uL   Immature Granulocytes 0 %   Abs Immature Granulocytes 0.01 0.00 - 0.07 K/uL    Comment: Performed at [facility], [place] [address], [place], [place] [address]  Magnesium     Status: Abnormal   Collection Time: 11/21/22 11:48 AM  Result Value Ref Range   Magnesium 2.6 (H) 1.7 - 2.4 mg/dL    Comment: Performed at [facility] [address], [place], [place] [address]  Protime-INR     Status: None   Collection Time: 11/21/22 11:48 AM  Result Value Ref Range   Prothrombin Time 13.3 11.4 - 15.2 seconds   INR 1.0 0.8 - 1.2    Comment: (NOTE) INR goal varies based on device and disease states. Performed at [facility], [place] [address], [place], [place] [address]   Troponin I (High Sensitivity)     Status: Abnormal   Collection Time: 11/21/22  1:46 PM  Result Value Ref Range   Troponin I (High Sensitivity) 26 (H) <18 ng/L    Comment: (NOTE) Elevated high sensitivity troponin I (hsTnI) values and significant   changes across serial measurements may suggest ACS but many other  chronic and acute conditions are known to elevate hsTnI results.  Refer to the "Links" section for chest pain algorithms and additional  guidance. Performed at [facility], [place] [address] [place], [place] [address]  DG Chest Port 1 View  Result Date: 11/21/2022 CLINICAL DATA:  Weakness EXAM: PORTABLE CHEST 1 VIEW COMPARISON:  09/06/2021 FINDINGS: Enlargement of the cardiac silhouette, which has increased from prior. Aortic atherosclerosis. Pulmonary vascular congestion. Minimal streaky left basilar opacity. No pleural effusion or pneumothorax. IMPRESSION: 1. Enlargement of the cardiac silhouette, which has increased from prior. Pericardial effusion is not excluded. 2. Pulmonary vascular congestion. Electronically Signed   By: Davina Poke D.O.   On: 11/21/2022 12:31    Pending Labs Unresulted Labs (From admission, onward)     Start     Ordered   11/22/22 [id number]  Basic metabolic panel  Tomorrow morning,   R        11/21/22 1418   11/21/22 1503  Surgical PCR screen  (Screening)  Once,   R        11/21/22 1503            Vitals/Pain Today's Vitals   11/21/22 1345 11/21/22 1346 11/21/22 1430 11/21/22 1500  BP:   (!) 158/58 (!) 157/55  Pulse: (!) 34  (!) 34 (!) 34  Resp: 16  17 17  $ Temp:      TempSrc:      SpO2: 99%  97% 96%  Weight:      Height:      PainSc:  0-No pain      Isolation Precautions No active isolations  Medications Medications  acetaminophen (TYLENOL) tablet 650 mg (has no administration in time range)  ondansetron (ZOFRAN) injection 4 mg (has no administration in time range)  0.9 %  sodium chloride infusion (has no administration in time range)  gentamicin (GARAMYCIN) 80 mg in sodium chloride 0.9 % 500 mL irrigation (has no administration in time range)  chlorhexidine (HIBICLENS) 4 % liquid 4 Application (has no administration in time range)  chlorhexidine (HIBICLENS) 4 %  liquid 4 Application (has no administration in time range)  sodium chloride flush (NS) 0.9 % injection 3 mL (3 mLs Intravenous Not Given 11/21/22 1451)  sodium chloride flush (NS) 0.9 % injection 3 mL (has no administration in time range)  0.9 %  sodium chloride infusion (has no administration in time range)  vancomycin (VANCOCIN) IVPB 1000 mg/200 mL premix (has no administration in time range)  rosuvastatin (CRESTOR) tablet 10 mg (has no administration in time range)  dapagliflozin propanediol (FARXIGA) tablet 10 mg (has no administration in time range)  levothyroxine (SYNTHROID) tablet 88 mcg (has no administration in time range)  gabapentin (NEURONTIN) capsule 100 mg (has no administration in time range)  albuterol (VENTOLIN HFA) 108 (90 Base) MCG/ACT inhaler 2 puff (has no administration in time range)  fluticasone (FLONASE) 50 MCG/ACT nasal spray 1-2 spray (has no administration in time range)  fluticasone furoate-vilanterol (BREO ELLIPTA) 100-25 MCG/ACT 1 puff (has no administration in time range)    And  umeclidinium bromide (INCRUSE ELLIPTA) 62.5 MCG/ACT 1 puff (has no administration in time range)    Mobility walks     Focused Assessments Cardiac Assessment Handoff:  Cardiac Rhythm: Sinus bradycardia, Heart block  Does the Patient currently have chest pain? No  BP 35, no chest pain, endorses generalized weakness   R Recommendations: See Admitting Provider Note  Report given to:   Additional Notes: Pt A&Ox4, GCS 15

## 2022-11-21 NOTE — ED Notes (Signed)
Cardiology at bedside.

## 2022-11-21 NOTE — ED Triage Notes (Signed)
Pt BIB [facility] from the [place] office. Chief complaint of Bradycardia. Patient reports feeling weak for about a week, known history of bradycardia. 35-38 HR. A-symptomatic. Pressure [id number] systolic. 20# L AC

## 2022-11-21 NOTE — ED Notes (Signed)
Echo tech at bedside.

## 2022-11-21 NOTE — ED Provider Notes (Signed)
[facility] Provider Note   CSN: [id number] Arrival date & time: 11/21/22  1134     History  Chief Complaint  Patient presents with   Bradycardia    Geneta Mudge Kaelynn Fenton is a 82 y.o. female.  She has a history of breast cancer congestive heart failure COPD.  She has had a slow heart rate since November and has been feeling progressively weak and tired.  She was at her cardiologist office today and found to be in possibly complete heart block and was transferred here for further evaluation by cardiology.  She does not have any fevers chills cough chest pain abdominal pain vomiting diarrhea.  It does not look like she is on any AV nodal blockers.  No dizziness syncope  The history is provided by the patient.  Weakness Severity:  Moderate Onset quality:  Gradual Timing:  Constant Progression:  Unchanged Chronicity:  New Relieved by:  Nothing Worsened by:  Activity Ineffective treatments:  Rest Associated symptoms: no abdominal pain, no chest pain, no cough, no diarrhea, no dysuria, no fever, no shortness of breath, no stroke symptoms and no syncope   Risk factors: congestive heart failure        Home Medications Prior to Admission medications   Medication Sig Start Date End Date Taking? Authorizing Provider  Acetaminophen (TYLENOL 8 HOUR ARTHRITIS PAIN PO) Take 1-2 tablets by mouth as needed for pain.    [provider]  albuterol (VENTOLIN HFA) 108 (90 Base) MCG/ACT inhaler Inhale 2 puffs into the lungs every 6 (six) hours as needed for wheezing or shortness of breath. 07/20/20   [provider]  aspirin 81 MG EC tablet Take 81 mg by mouth daily. Swallow whole.    [provider]  calcium carbonate (OSCAL) 1500 (600 Ca) MG TABS tablet Take 1 tablet by mouth daily with breakfast.    [provider]  FARXIGA 10 MG TABS tablet Take 10 mg by mouth daily. 10/25/21   [provider]   fluticasone (FLONASE) 50 MCG/ACT nasal spray Place 1-2 sprays into both nostrils as needed for allergies or rhinitis.    [provider]  Fluticasone-Umeclidin-Vilant (TRELEGY ELLIPTA) 100-62.5-25 MCG/ACT AEPB Inhale 1 puff into the lungs daily.    [provider]  furosemide (LASIX) 40 MG tablet Take 1 tablet (40 mg total) by mouth daily AND 1 tablet (40 mg total) every other day. 07/18/22   Richardo Priest, MD  gabapentin (NEURONTIN) 100 MG capsule Take 100 mg by mouth 2 (two) times daily.    [provider]  hydrocortisone cream 1 % Apply 1 application  topically daily as needed for itching.    [provider]  levothyroxine (SYNTHROID) 88 MCG tablet Take 88 mcg by mouth daily before breakfast. 08/18/19   [provider]  loratadine (CLARITIN) 10 MG tablet Take 10 mg by mouth daily.    [provider]  Magnesium 250 MG TABS Take 250 mg by mouth daily.    [provider]  OXYGEN Inhale 2 L into the lungs at bedtime.    [provider]  pioglitazone (ACTOS) 30 MG tablet Take 30 mg by mouth daily. Patient not taking: Reported on 11/21/2022 03/25/22   [provider]  polymixin-bacitracin (POLYSPORIN) 500-10000 UNIT/GM OINT ointment Apply 1 application  topically 2 (two) times daily.    [provider]  potassium chloride (KLOR-CON) 10 MEQ tablet Take 1 tablet (10 mEq total) by mouth  daily. 07/18/22   Richardo Priest, MD  rosuvastatin (CRESTOR) 10 MG tablet Take 1 tablet (10 mg total) by mouth daily. 04/30/22   Richardo Priest, MD      Allergies    Darvon [propoxyphene] and Penicillins    Review of Systems   Review of Systems  Constitutional:  Negative for fever.  HENT:  Negative for sore throat.   Respiratory:  Negative for cough and shortness of breath.   Cardiovascular:  Negative for chest pain and syncope.  Gastrointestinal:  Negative for abdominal pain and diarrhea.  Genitourinary:  Negative for  dysuria.  Skin:  Negative for rash.  Neurological:  Positive for weakness.    Physical Exam Updated Vital Signs BP (!) 147/87   Pulse (!) 35   Temp (!) 97.5 F (36.4 C) (Oral)   Resp 14   Ht 5' 3.5" (1.613 m)   Wt 90.3 kg   SpO2 99%   BMI 34.70 kg/m  Physical Exam Vitals and nursing note reviewed.  Constitutional:      General: She is not in acute distress.    Appearance: Normal appearance. She is well-developed.  HENT:     Head: Normocephalic and atraumatic.  Eyes:     Conjunctiva/sclera: Conjunctivae normal.  Cardiovascular:     Rate and Rhythm: Regular rhythm. Bradycardia present.     Heart sounds: No murmur heard. Pulmonary:     Effort: Pulmonary effort is normal. No respiratory distress.     Breath sounds: Normal breath sounds.  Abdominal:     Palpations: Abdomen is soft.     Tenderness: There is no abdominal tenderness. There is no guarding or rebound.  Musculoskeletal:        General: No deformity.     Cervical back: Neck supple.  Skin:    General: Skin is warm and dry.     Capillary Refill: Capillary refill takes less than 2 seconds.  Neurological:     General: No focal deficit present.     Mental Status: She is alert.     ED Results / Procedures / Treatments   Labs (all labs ordered are listed, but only abnormal results are displayed) Labs Reviewed  BASIC METABOLIC PANEL - Abnormal; Notable for the following components:      Result Value   Glucose, Bld 111 (*)    Creatinine, Ser 1.23 (*)    GFR, Estimated 45 (*)    All other components within normal limits  CBC WITH DIFFERENTIAL/PLATELET - Abnormal; Notable for the following components:   Hemoglobin 15.2 (*)    HCT 47.2 (*)    MCV 101.3 (*)    Platelets 103 (*)    All other components within normal limits  MAGNESIUM - Abnormal; Notable for the following components:   Magnesium 2.6 (*)    All other components within normal limits  TROPONIN I (HIGH SENSITIVITY) - Abnormal; Notable for the  following components:   Troponin I (High Sensitivity) 25 (*)    All other components within normal limits  TROPONIN I (HIGH SENSITIVITY) - Abnormal; Notable for the following components:   Troponin I (High Sensitivity) 26 (*)    All other components within normal limits  SURGICAL PCR SCREEN  PROTIME-INR    EKG EKG Interpretation  Date/Time:  Thursday November 21 2022 11:42:26 EST Ventricular Rate:  35 PR Interval:  333 QRS Duration: 172 QT Interval:  590 QTC Calculation: 451 R Axis:   -68 Text Interpretation: Sinus bradycardia Prolonged PR interval Right  bundle branch block LVH with IVCD and secondary repol abnrm new from prior 10/11 Confirmed by Aletta Edouard [id number]) on 11/21/2022 11:53:00 AM  Radiology ECHOCARDIOGRAM COMPLETE  Result Date: 11/21/2022    ECHOCARDIOGRAM REPORT   Patient Name:   Maylia Karapetyan Date of Exam: 11/21/2022 Medical Rec #:  [id number]                    Height:       63.5 in Accession #:    [id number]                   Weight:       199.0 lb Date of Birth:  Aug 02, 1941                   BSA:          1.941 m Patient Age:    30 years                     BP:           143/69 mmHg Patient Gender: F                            HR:           34 bpm. Exam Location:  Inpatient Procedure: 2D Echo, Cardiac Doppler and Color Doppler Indications:    Bradycardia  History:        Patient has prior history of Echocardiogram examinations. CHF,                 COPD, Aortic Valve Disease and Mitral Valve Disease; Risk                 Factors:Diabetes, Hypertension and Dyslipidemia.  Sonographer:    Meagan Baucom RDCS, FE, PE Referring Phys: RENEE LYNN URSUY IMPRESSIONS  1. Left ventricular ejection fraction, by estimation, is 65%. The left ventricle has normal function. The left ventricle has no regional wall motion abnormalities. There is severe left ventricular hypertrophy. Left ventricular diastolic parameters are indeterminate.  2. Right ventricular systolic function  is normal. The right ventricular size is normal. There is moderately elevated pulmonary artery systolic pressure. The estimated right ventricular systolic pressure is [id number] mmHg.  3. Left atrial size was severely dilated.  4. Right atrial size was mildly dilated.  5. The mitral valve is degenerative. Mild systolic and diastolic mitral valve regurgitation. Mild to moderate mitral stenosis. The mean mitral valve gradient is 5.0 mmHg with average heart rate of 35 bpm. Severe, exuberant mitral annular calcification.  6. Tricuspid valve regurgitation is mild to moderate.  7. The aortic valve is abnormal. There is severe calcifcation of the aortic valve. Aortic valve regurgitation is trivial. Moderate to severe aortic valve stenosis. Aortic valve mean gradient measures 33.0 mmHg. Aortic valve Vmax measures 3.60 m/s. Valve  area and DVI calculation impacted by high LVOT TVI.  8. The inferior vena cava is dilated in size with >50% respiratory variability, suggesting right atrial pressure of 8 mmHg.  9. Trivial pericardial effusion. FINDINGS  Left Ventricle: Left ventricular ejection fraction, by estimation, is 65%. The left ventricle has normal function. The left ventricle has no regional wall motion abnormalities. The left ventricular internal cavity size was normal in size. There is severe left ventricular hypertrophy. Left ventricular diastolic function could not be evaluated due to mitral annular calcification (moderate or greater). Left ventricular diastolic  parameters are indeterminate. Right Ventricle: The right ventricular size is normal. No increase in right ventricular wall thickness. Right ventricular systolic function is normal. There is moderately elevated pulmonary artery systolic pressure. The tricuspid regurgitant velocity is 3.16 m/s, and with an assumed right atrial pressure of 8 mmHg, the estimated right ventricular systolic pressure is [id number] mmHg. Left Atrium: Left atrial size was severely dilated. Right  Atrium: Right atrial size was mildly dilated. Pericardium: There is no evidence of pericardial effusion. Mitral Valve: The mitral valve is degenerative in appearance. Severe mitral annular calcification. Mild systolic and diastolic mitral valve regurgitation. Mild to moderate mitral valve stenosis. MV peak gradient, 16.2 mmHg. The mean mitral valve gradient  is 5.0 mmHg with average heart rate of 35 bpm. Tricuspid Valve: The tricuspid valve is normal in structure. Tricuspid valve regurgitation is mild to moderate. No evidence of tricuspid stenosis. Aortic Valve: The aortic valve is abnormal. There is severe calcifcation of the aortic valve. Aortic valve regurgitation is trivial. Moderate to severe aortic stenosis is present. Aortic valve mean gradient measures 33.0 mmHg. Aortic valve peak gradient measures 51.8 mmHg. Aortic valve area, by VTI measures 0.90 cm. Pulmonic Valve: The pulmonic valve was not well visualized. Pulmonic valve regurgitation is trivial. No evidence of pulmonic stenosis. Aorta: The aortic root is normal in size and structure. Venous: The inferior vena cava is dilated in size with greater than 50% respiratory variability, suggesting right atrial pressure of 8 mmHg. IAS/Shunts: The atrial septum is grossly normal.  LEFT VENTRICLE PLAX 2D LVIDd:         3.50 cm   Diastology LVIDs:         2.10 cm   LV e' medial:    7.07 cm/s LV PW:         1.30 cm   LV E/e' medial:  35.6 LV IVS:        1.50 cm   LV e' lateral:   4.79 cm/s LVOT diam:     1.60 cm   LV E/e' lateral: 52.6 LV SV:         103 LV SV Index:   53 LVOT Area:     2.01 cm  RIGHT VENTRICLE RV S prime:     10.00 cm/s TAPSE (M-mode): 1.6 cm LEFT ATRIUM              Index        RIGHT ATRIUM           Index LA diam:        5.00 cm  2.58 cm/m   RA Area:     20.50 cm LA Vol (A2C):   178.0 ml 91.71 ml/m  RA Volume:   61.60 ml  31.74 ml/m LA Vol (A4C):   167.0 ml 86.04 ml/m LA Biplane Vol: 173.0 ml 89.13 ml/m  AORTIC VALVE AV Area (Vmax):     0.92 cm AV Area (Vmean):   0.90 cm AV Area (VTI):     0.90 cm AV Vmax:           360.00 cm/s AV Vmean:          260.000 cm/s AV VTI:            1.140 m AV Peak Grad:      51.8 mmHg AV Mean Grad:      33.0 mmHg LVOT Vmax:         164.00 cm/s LVOT Vmean:        117.000 cm/s  LVOT VTI:          0.511 m LVOT/AV VTI ratio: 0.45  AORTA Ao Root diam: 2.90 cm MITRAL VALVE               TRICUSPID VALVE MV Area (PHT): 2.75 cm    TV Peak grad:   24.0 mmHg MV Peak grad:  16.2 mmHg   TV Vmax:        2.45 m/s MV Mean grad:  5.0 mmHg    TR Peak grad:   39.9 mmHg MV Vmax:       2.01 m/s    TR Vmax:        316.00 cm/s MV Vmean:      101.0 cm/s MV Decel Time: 276 msec    SHUNTS                            Systemic VTI:  0.51 m                            Systemic Diam: 1.60 cm Cherlynn Kaiser MD Electronically signed by Cherlynn Kaiser MD Signature Date/Time: 11/21/2022/4:28:09 PM    Final    DG Chest Port 1 View  Result Date: 11/21/2022 CLINICAL DATA:  Weakness EXAM: PORTABLE CHEST 1 VIEW COMPARISON:  09/06/2021 FINDINGS: Enlargement of the cardiac silhouette, which has increased from prior. Aortic atherosclerosis. Pulmonary vascular congestion. Minimal streaky left basilar opacity. No pleural effusion or pneumothorax. IMPRESSION: 1. Enlargement of the cardiac silhouette, which has increased from prior. Pericardial effusion is not excluded. 2. Pulmonary vascular congestion. Electronically Signed   By: Davina Poke D.O.   On: 11/21/2022 12:31    Procedures .Critical Care  Performed by: Hayden Rasmussen, MD Authorized by: Hayden Rasmussen, MD   Critical care provider statement:    Critical care time (minutes):  45   Critical care time was exclusive of:  Separately billable procedures and treating other patients   Critical care was necessary to treat or prevent imminent or life-threatening deterioration of the following conditions:  Cardiac failure   Critical care was time spent personally by me on the following  activities:  Development of treatment plan with patient or surrogate, discussions with consultants, evaluation of patient's response to treatment, examination of patient, obtaining history from patient or surrogate, ordering and performing treatments and interventions, ordering and review of laboratory studies, ordering and review of radiographic studies, pulse oximetry, re-evaluation of patient's condition and review of old charts   I assumed direction of critical care for this patient from another provider in my specialty: no       Medications Ordered in ED Medications  acetaminophen (TYLENOL) tablet 650 mg (has no administration in time range)  ondansetron (ZOFRAN) injection 4 mg (has no administration in time range)  0.9 %  sodium chloride infusion (0 mLs Intravenous Hold 11/21/22 1536)  gentamicin (GARAMYCIN) 80 mg in sodium chloride 0.9 % 500 mL irrigation (0 mg Irrigation Hold 11/21/22 1538)  chlorhexidine (HIBICLENS) 4 % liquid 4 Application (has no administration in time range)  chlorhexidine (HIBICLENS) 4 % liquid 4 Application (has no administration in time range)  sodium chloride flush (NS) 0.9 % injection 3 mL (3 mLs Intravenous Not Given 11/21/22 1451)  sodium chloride flush (NS) 0.9 % injection 3 mL (has no administration in time range)  0.9 %  sodium chloride infusion (0 mLs Intravenous Hold 11/21/22 1536)  vancomycin (VANCOCIN) IVPB 1000 mg/200 mL premix (0 mg Intravenous Hold 11/21/22 1535)  rosuvastatin (CRESTOR) tablet 10 mg (has no administration in time range)  gabapentin (NEURONTIN) capsule 100 mg (has no administration in time range)  fluticasone (FLONASE) 50 MCG/ACT nasal spray 1-2 spray (has no administration in time range)  fluticasone furoate-vilanterol (BREO ELLIPTA) 100-25 MCG/ACT 1 puff (1 puff Inhalation Not Given 11/21/22 1530)    And  umeclidinium bromide (INCRUSE ELLIPTA) 62.5 MCG/ACT 1 puff (1 puff Inhalation Not Given 11/21/22 1530)  dapagliflozin propanediol  (FARXIGA) tablet 10 mg (has no administration in time range)  albuterol (PROVENTIL) (2.5 MG/3ML) 0.083% nebulizer solution 2.5 mg (has no administration in time range)  levothyroxine (SYNTHROID) tablet 88 mcg (has no administration in time range)    ED Course/ Medical Decision Making/ A&P Clinical Course as of 11/21/22 1628  Thu Nov 21, 2022  1153 Discussed with Trish from cardiology who will have somebody come and see the patient. [MB]    Clinical Course User Index [MB] Hayden Rasmussen, MD                             Medical Decision Making Amount and/or Complexity of Data Reviewed Labs: ordered. Radiology: ordered.  Risk Decision regarding hospitalization.   This patient complains of fatigue abnormal heart rhythm; this involves an extensive number of treatment Options and is a complaint that carries with it a high risk of complications and morbidity. The differential includes heart block, metabolic derangement, angina, ACS  I ordered, reviewed and interpreted labs, which included CBC with normal white count normal hemoglobin platelets slightly low, chemistries with elevated creatinine, troponins mildly elevated I ordered imaging studies which included chest x-ray chest x-ray and I independently    visualized and interpreted imaging which showed  cardiomegaly Additional history obtained from  EMS Previous records obtained and reviewed  recent cardiology notes I consulted cardiology and discussed lab and imaging findings and discussed disposition.  Cardiac monitoring reviewed, bradycardia possible ventricular escape Social determinants considered, no significant barriers Critical Interventions: Patient Fonnie Mu cardiac monitoring  After the interventions stated above, I reevaluated the patient and found patient to be significantly bradycardic in the 30s with normal blood pressure.  She is established IV and pacer pads on her.  Have not initiated pacing as her blood pressures  remained stable. Admission and further testing considered, cardiology is evaluating for admission and likely will need pacemaker.         Final Clinical Impression(s) / ED Diagnoses Final diagnoses:  Complete heart block (HCC)  Symptomatic bradycardia    Rx / DC Orders ED Discharge Orders     None         Hayden Rasmussen, MD 11/21/22 [phone]

## 2022-11-21 NOTE — H&P (Signed)
Cardiology Admission History and Physical   Patient ID: [name] [name] MRN: [id number]; DOB: [birth year]   Admission date: 11/21/2022  PCP:  Marco Collie, [place] [facility] Cardiologist:  Dr. Bettina Gavia   Chief Complaint:  heart block  Patient Profile:   [name] [name] is a 82 y.o. female with HTN, COPD, VHD w/AS, CKD (III), DM, HLD, hypothyroid, who is being seen 11/21/2022 for the evaluation of advanced heart block.  History of Present Illness:   Ms. [name] [name] saw Dr. Bettina Gavia in August, she was doing well, he was watching her echos for some AS, also known to have chronic diastolic HF, HTN/hypertensive heart disease. She denied any symptoms. Described baseline RBBB/bifascicular block  She saw Dr. Agustin Cree today as an add on with reports of slow HRs, reporting ADLs though more tired and weak, no CP, SOB, no syncope. He found he in CHB rate 36 without change in her baseline QRS morphology. No nodal blocking agents noted Sent to the ER via EMS for EP evaluation and PPM   LABS K+ 3.9 BUN/Creat 21/1.23 Mag 2.6 WBC 4.1 H/H 15/42 Plts 103  HS Trop 25  She reports weeks of an observed slow HR apparently, perhaps via one of her doctors, monitoring at home  The patient reports unusual fatigue, lack of energy, no CP, SOB No syncope   Past Medical History:  Diagnosis Date   Aortic valve stenosis, moderate    Arthritis    Asthma    Benign hypertension with CKD (chronic kidney disease) stage III (HCC)    COPD (chronic obstructive pulmonary disease) (HCC)    Diabetic peripheral neuropathy (HCC)    Diastolic congestive heart failure ([place])    DM type 2 with diabetic mixed hyperlipidemia (HCC)    Hearing loss    History of breast cancer    HLD (hyperlipidemia)    HTN (hypertension)    Hypothyroid    Intraductal carcinoma in situ of right breast 04/27/2014   Mitral valve stenosis    Morbid obesity ([place])    Muscle pain     Osteopenia after menopause 07/11/2017   Polyposis associated with heterozygous mutation in MUTYH gene 01/03/2022   Sinus problem    Swelling    Tubular adenoma of colon 01/03/2022   Multiple, MUTYH mutation    Past Surgical History:  Procedure Laterality Date   BACK SURGERY  02/14/2014   MASTECTOMY Right 2015   PARTIAL HYSTERECTOMY  1980   TONSILLECTOMY     TOTAL KNEE ARTHROPLASTY Right 02/07/2011     Medications Prior to Admission: Prior to Admission medications   Medication Sig Start Date End Date Taking? Authorizing Provider  Acetaminophen (TYLENOL 8 HOUR ARTHRITIS PAIN PO) Take 1-2 tablets by mouth as needed for pain.    [provider]  albuterol (VENTOLIN HFA) 108 (90 Base) MCG/ACT inhaler Inhale 2 puffs into the lungs every 6 (six) hours as needed for wheezing or shortness of breath. 07/20/20   [provider]  aspirin 81 MG EC tablet Take 81 mg by mouth daily. Swallow whole.    [provider]  calcium carbonate (OSCAL) 1500 (600 Ca) MG TABS tablet Take 1 tablet by mouth daily with breakfast.    [provider]  FARXIGA 10 MG TABS tablet Take 10 mg by mouth daily. 10/25/21   [provider]  fluticasone (FLONASE) 50 MCG/ACT nasal spray Place 1-2 sprays into both nostrils as needed for allergies or rhinitis.  [provider]  Fluticasone-Umeclidin-Vilant (TRELEGY ELLIPTA) 100-62.5-25 MCG/ACT AEPB Inhale 1 puff into the lungs daily.    [provider]  furosemide (LASIX) 40 MG tablet Take 1 tablet (40 mg total) by mouth daily AND 1 tablet (40 mg total) every other day. 07/18/22   Richardo Priest, MD  gabapentin (NEURONTIN) 100 MG capsule Take 100 mg by mouth 2 (two) times daily.    [provider]  hydrocortisone cream 1 % Apply 1 application  topically daily as needed for itching.    [provider]  levothyroxine (SYNTHROID) 88 MCG tablet Take 88 mcg by mouth daily before breakfast. 08/18/19    [provider]  loratadine (CLARITIN) 10 MG tablet Take 10 mg by mouth daily.    [provider]  Magnesium 250 MG TABS Take 250 mg by mouth daily.    [provider]  OXYGEN Inhale 2 L into the lungs at bedtime.    [provider]  pioglitazone (ACTOS) 30 MG tablet Take 30 mg by mouth daily. Patient not taking: Reported on 11/21/2022 03/25/22   [provider]  polymixin-bacitracin (POLYSPORIN) 500-10000 UNIT/GM OINT ointment Apply 1 application  topically 2 (two) times daily.    [provider]  potassium chloride (KLOR-CON) 10 MEQ tablet Take 1 tablet (10 mEq total) by mouth daily. 07/18/22   Richardo Priest, MD  rosuvastatin (CRESTOR) 10 MG tablet Take 1 tablet (10 mg total) by mouth daily. 04/30/22   Richardo Priest, MD     Allergies:    Allergies  Allergen Reactions   Darvon [Propoxyphene] Swelling   Penicillins Swelling    Social History:   Social History   Socioeconomic History   Marital status: Widowed    Spouse name: Not on file   Number of children: 3   Years of education: Not on file   Highest education level: Not on file  Occupational History   Occupation: bakery    Employer: [id number]  Tobacco Use   Smoking status: Never    Passive exposure: Never   Smokeless tobacco: Never  Vaping Use   Vaping Use: Never used  Substance and Sexual Activity   Alcohol use: Yes    Comment: Minimal amount   Drug use: No   Sexual activity: Not on file  Other Topics Concern   Not on file  Social History Narrative   Not on file   Social Determinants of Health   Financial Resource Strain: Not on file  Food Insecurity: Not on file  Transportation Needs: Not on file  Physical Activity: Not on file  Stress: Not on file  Social Connections: Not on file  Intimate Partner Violence: Not on file    Family History:   The patient's family history includes Alzheimer's disease in an other family member; Breast cancer in her sister;  Diabetes Mellitus I in her mother; Heart disease in her mother.    ROS:  Please see the history of present illness.  All other ROS reviewed and negative.     Physical Exam/Data:   Vitals:   11/21/22 1138 11/21/22 1141 11/21/22 1145 11/21/22 1215  BP:  (!) 147/87 (!) 133/50 (!) 147/62  Pulse:  (!) 35 (!) 35 (!) 34  Resp:  '14 10 15  '$ Temp:  (!) 97.5 F (36.4 C)    TempSrc:  Oral    SpO2:  99% 99% 100%  Weight: 90.3 kg     Height: 5' 3.5" (1.613 m)  No intake or output data in the 24 hours ending 11/21/22 1326    11/21/2022   11:38 AM 11/21/2022    9:51 AM 04/30/2022    3:24 PM  Last 3 Weights  Weight (lbs) 199 lb 199 lb 202 lb 3.2 oz  Weight (kg) 90.266 kg 90.266 kg 91.717 kg     Body mass index is 34.7 kg/m.  General:  Well nourished, well developed, in no acute distress HEENT: normal Neck: no JVD Vascular: No carotid bruits; Distal pulses 2+ bilaterally   Cardiac RRR; bradycardic, no murmurs, gallops or rubs Lungs:  clear to auscultation bilaterally, no wheezing, rhonchi or rales  Abd: soft, nontender Ext: no edema Musculoskeletal:  No deformities Skin: warm and dry  Neuro:  no focal abnormalities noted Psych:  Normal affect    EKG:  personally reviewed and demonstrates  Outpt today CHB junctional escape 36bpm, baseline RBBB, LAD 2:1 AVblock 35bpm, RBBB, LAD  OLD 04/30/22: SR 77, RBBB, LAD   Relevant CV Studies:  04/23/22: TTE 1. Left ventricular ejection fraction, by estimation, is 60 to 65%. The  left ventricle has normal function. The left ventricle has no regional  wall motion abnormalities. There is moderate left ventricular hypertrophy.  Left ventricular diastolic  parameters are consistent with Grade I diastolic dysfunction (impaired  relaxation).   2. Right ventricular systolic function is normal. The right ventricular  size is normal. There is normal pulmonary artery systolic pressure.   3. The mitral valve is normal in structure. No evidence of  mitral valve  regurgitation. Mild to moderate mitral stenosis.   4. The aortic valve is normal in structure. There is mild calcification  of the aortic valve. There is mild thickening of the aortic valve. Aortic  valve regurgitation is not visualized. Mild to moderate aortic valve  stenosis. Aortic valve area, by VTI  measures 1.51 cm. Aortic valve mean gradient measures 26.5 mmHg.   5. The inferior vena cava is normal in size with greater than 50%  respiratory variability, suggesting right atrial pressure of 3 mmHg.   Laboratory Data:  High Sensitivity Troponin:   Recent Labs  Lab 11/21/22 1148  TROPONINIHS 25*      Chemistry Recent Labs  Lab 11/21/22 1148  NA 140  K 3.9  CL 106  CO2 25  GLUCOSE 111*  BUN 21  CREATININE 1.23*  CALCIUM 9.1  MG 2.6*  GFRNONAA 45*  ANIONGAP 9    No results for input(s): "PROT", "ALBUMIN", "AST", "ALT", "ALKPHOS", "BILITOT" in the last 168 hours. Lipids No results for input(s): "CHOL", "TRIG", "HDL", "LABVLDL", "LDLCALC", "CHOLHDL" in the last 168 hours. Hematology Recent Labs  Lab 11/21/22 1148  WBC 4.1  RBC 4.66  HGB 15.2*  HCT 47.2*  MCV 101.3*  MCH 32.6  MCHC 32.2  RDW 14.6  PLT 103*   Thyroid No results for input(s): "TSH", "FREET4" in the last 168 hours. BNPNo results for input(s): "BNP", "PROBNP" in the last 168 hours.  DDimer No results for input(s): "DDIMER" in the last 168 hours.   Radiology/Studies:   DG Chest Port 1 View Result Date: 11/21/2022 CLINICAL DATA:  Weakness EXAM: PORTABLE CHEST 1 VIEW COMPARISON:  09/06/2021 FINDINGS: Enlargement of the cardiac silhouette, which has increased from prior. Aortic atherosclerosis. Pulmonary vascular congestion. Minimal streaky left basilar opacity. No pleural effusion or pneumothorax. IMPRESSION: 1. Enlargement of the cardiac silhouette, which has increased from prior. Pericardial effusion is not excluded. 2. Pulmonary vascular congestion. Electronically Signed   By:  Nicholas  Plundo D.O.   On: 11/21/2022 12:31     Assessment and Plan:   CHB Baseline conduction system disease No reversible causes No nodal blocking agents on home meds Will get a stat limited echo to evaluate for any pericardial effusion given CXR read Exam is stable, nothing to suggest tamponade, no rub is appreciate  She will need PPM Dr. Myles Gip discussed with he, rational, the procedure, potential risks/benefits, she is agreeable to proceed.  We will work to get this done today, possibly tomorrow Pt is aware NPO  VHD Mod AS, by her last echo  HTN Stable BP Hold meds  DM SSI  COPD Home regime  Risk Assessment/Risk Scores:      For questions or updates, please contact [place] Please consult [email or web address] for contact info under     Signed, Baldwin Jamaica, PA-C  11/21/2022 1:26 PM

## 2022-11-21 NOTE — Plan of Care (Signed)
  Problem: Clinical Measurements: Goal: Cardiovascular complication will be avoided Outcome: Progressing   Problem: Nutrition: Goal: Adequate nutrition will be maintained 11/21/2022 1740 by Arna Medici, RN Outcome: Progressing 11/21/2022 1739 by Arna Medici, RN Outcome: Progressing   Problem: Coping: Goal: Level of anxiety will decrease Outcome: Progressing

## 2022-11-22 ENCOUNTER — Other Ambulatory Visit: Payer: Self-pay

## 2022-11-22 ENCOUNTER — Encounter (HOSPITAL_COMMUNITY)
Admission: EM | Disposition: A | Discharge status: Home / Self Care | Payer: Self-pay | Source: Home / Self Care | Attending: Cardiovascular Disease

## 2022-11-22 DIAGNOSIS — Z880 Allergy status to penicillin: Secondary | ICD-10-CM | POA: Diagnosis not present

## 2022-11-22 DIAGNOSIS — R001 Bradycardia, unspecified: Secondary | ICD-10-CM | POA: Diagnosis not present

## 2022-11-22 DIAGNOSIS — H9190 Unspecified hearing loss, unspecified ear: Secondary | ICD-10-CM | POA: Diagnosis present

## 2022-11-22 DIAGNOSIS — Z96651 Presence of right artificial knee joint: Secondary | ICD-10-CM | POA: Diagnosis present

## 2022-11-22 DIAGNOSIS — E1122 Type 2 diabetes mellitus with diabetic chronic kidney disease: Secondary | ICD-10-CM | POA: Diagnosis present

## 2022-11-22 DIAGNOSIS — Z9011 Acquired absence of right breast and nipple: Secondary | ICD-10-CM | POA: Diagnosis not present

## 2022-11-22 DIAGNOSIS — I5032 Chronic diastolic (congestive) heart failure: Secondary | ICD-10-CM | POA: Diagnosis present

## 2022-11-22 DIAGNOSIS — I442 Atrioventricular block, complete: Secondary | ICD-10-CM | POA: Diagnosis present

## 2022-11-22 DIAGNOSIS — E1142 Type 2 diabetes mellitus with diabetic polyneuropathy: Secondary | ICD-10-CM | POA: Diagnosis present

## 2022-11-22 DIAGNOSIS — I083 Combined rheumatic disorders of mitral, aortic and tricuspid valves: Secondary | ICD-10-CM | POA: Diagnosis present

## 2022-11-22 DIAGNOSIS — Z79899 Other long term (current) drug therapy: Secondary | ICD-10-CM | POA: Diagnosis not present

## 2022-11-22 DIAGNOSIS — I130 Hypertensive heart and chronic kidney disease with heart failure and stage 1 through stage 4 chronic kidney disease, or unspecified chronic kidney disease: Secondary | ICD-10-CM | POA: Diagnosis present

## 2022-11-22 DIAGNOSIS — N1831 Chronic kidney disease, stage 3a: Secondary | ICD-10-CM | POA: Diagnosis present

## 2022-11-22 DIAGNOSIS — E039 Hypothyroidism, unspecified: Secondary | ICD-10-CM | POA: Diagnosis present

## 2022-11-22 DIAGNOSIS — Z888 Allergy status to other drugs, medicaments and biological substances status: Secondary | ICD-10-CM | POA: Diagnosis not present

## 2022-11-22 DIAGNOSIS — Z833 Family history of diabetes mellitus: Secondary | ICD-10-CM | POA: Diagnosis not present

## 2022-11-22 DIAGNOSIS — Z7982 Long term (current) use of aspirin: Secondary | ICD-10-CM | POA: Diagnosis not present

## 2022-11-22 DIAGNOSIS — Z820 Family history of epilepsy and other diseases of the nervous system: Secondary | ICD-10-CM | POA: Diagnosis not present

## 2022-11-22 DIAGNOSIS — Z853 Personal history of malignant neoplasm of breast: Secondary | ICD-10-CM | POA: Diagnosis not present

## 2022-11-22 DIAGNOSIS — E782 Mixed hyperlipidemia: Secondary | ICD-10-CM | POA: Diagnosis present

## 2022-11-22 DIAGNOSIS — J449 Chronic obstructive pulmonary disease, unspecified: Secondary | ICD-10-CM | POA: Diagnosis present

## 2022-11-22 DIAGNOSIS — Z8249 Family history of ischemic heart disease and other diseases of the circulatory system: Secondary | ICD-10-CM | POA: Diagnosis not present

## 2022-11-22 DIAGNOSIS — Z79890 Hormone replacement therapy (postmenopausal): Secondary | ICD-10-CM | POA: Diagnosis not present

## 2022-11-22 HISTORY — PX: PACEMAKER IMPLANT: EP1218

## 2022-11-22 LAB — BASIC METABOLIC PANEL
Anion gap: 9 (ref 5–15)
BUN: 22 mg/dL (ref 8–23)
CO2: 23 mmol/L (ref 22–32)
Calcium: 8.9 mg/dL (ref 8.9–10.3)
Chloride: 105 mmol/L (ref 98–111)
Creatinine, Ser: 1.15 mg/dL — ABNORMAL HIGH (ref 0.44–1.00)
GFR, Estimated: 48 mL/min — ABNORMAL LOW (ref >60)
Glucose, Bld: 103 mg/dL — ABNORMAL HIGH (ref 70–99)
Potassium: 3.9 mmol/L (ref 3.5–5.1)
Sodium: 137 mmol/L (ref 135–145)

## 2022-11-22 LAB — GLUCOSE, CAPILLARY
Glucose-Capillary: 97 mg/dL (ref 70–99)
Glucose-Capillary: 92 mg/dL (ref 70–99)
Glucose-Capillary: 96 mg/dL (ref 70–99)
Glucose-Capillary: 129 mg/dL — ABNORMAL HIGH (ref 70–99)

## 2022-11-22 SURGERY — PACEMAKER IMPLANT

## 2022-11-22 MED ORDER — VANCOMYCIN HCL IN DEXTROSE 1-5 GM/200ML-% IV SOLN
1000.0000 mg | Freq: Two times a day (BID) | INTRAVENOUS | Status: AC
  Administered 2022-11-23: 1000 mg via INTRAVENOUS
  Filled 2022-11-22 (×2): qty 200

## 2022-11-22 MED ORDER — SODIUM CHLORIDE 0.9 % IV SOLN
INTRAVENOUS | Status: AC
  Filled 2022-11-22: qty 2

## 2022-11-22 MED ORDER — ACETAMINOPHEN 325 MG PO TABS: 325.0000 mg | ORAL_TABLET | ORAL | Status: DC | PRN

## 2022-11-22 MED ORDER — LIDOCAINE HCL (PF) 1 % IJ SOLN
INTRAMUSCULAR | Status: AC
  Filled 2022-11-22: qty 60

## 2022-11-22 MED ORDER — VANCOMYCIN HCL IN DEXTROSE 1-5 GM/200ML-% IV SOLN
INTRAVENOUS | Status: AC
  Filled 2022-11-22: qty 200

## 2022-11-22 MED ORDER — LIDOCAINE HCL (PF) 1 % IJ SOLN
INTRAMUSCULAR | Status: DC | PRN
  Administered 2022-11-22: 50 mL

## 2022-11-22 MED ORDER — FENTANYL CITRATE (PF) 100 MCG/2ML IJ SOLN
INTRAMUSCULAR | Status: AC
  Filled 2022-11-22: qty 2

## 2022-11-22 MED ORDER — MIDAZOLAM HCL 5 MG/5ML IJ SOLN
INTRAMUSCULAR | Status: AC
  Filled 2022-11-22: qty 5

## 2022-11-22 MED ORDER — HEPARIN (PORCINE) IN NACL 1000-0.9 UT/500ML-% IV SOLN
INTRAVENOUS | Status: DC | PRN
  Administered 2022-11-22: 500 mL

## 2022-11-22 MED ORDER — MIDAZOLAM HCL 5 MG/5ML IJ SOLN
INTRAMUSCULAR | Status: DC | PRN
  Administered 2022-11-22: 1 mg via INTRAVENOUS

## 2022-11-22 MED ORDER — ACETAMINOPHEN 325 MG PO TABS
325.0000 mg | ORAL_TABLET | ORAL | Status: DC | PRN
  Administered 2022-11-22: 650 mg via ORAL
  Administered 2022-11-23: 325 mg via ORAL
  Administered 2022-11-23: 650 mg via ORAL
  Filled 2022-11-22 (×3): qty 2

## 2022-11-22 MED ORDER — FENTANYL CITRATE (PF) 100 MCG/2ML IJ SOLN
INTRAMUSCULAR | Status: DC | PRN
  Administered 2022-11-22: 25 ug via INTRAVENOUS

## 2022-11-22 SURGICAL SUPPLY — 13 items
CABLE SURGICAL S-101-97-12 (CABLE) ×1 IMPLANT
CATH RIGHTSITE C315HIS02 (CATHETERS) IMPLANT
IPG PACE AZUR XT DR MRI W1DR01 (Pacemaker) IMPLANT
KIT MICROPUNCTURE NIT STIFF (SHEATH) IMPLANT
LEAD CAPSURE NOVUS 5076-52CM (Lead) IMPLANT
LEAD SELECT SECURE 3830 383069 (Lead) IMPLANT
PACE AZURE XT DR MRI W1DR01 (Pacemaker) ×1 IMPLANT
PAD DEFIB RADIO PHYSIO CONN (PAD) ×1 IMPLANT
SELECT SECURE 3830 383069 (Lead) ×1 IMPLANT
SHEATH 7FR PRELUDE SNAP 13 (SHEATH) IMPLANT
SLITTER 6232ADJ (MISCELLANEOUS) IMPLANT
TRAY PACEMAKER INSERTION (PACKS) ×1 IMPLANT
WIRE HI TORQ VERSACORE-J 145CM (WIRE) IMPLANT

## 2022-11-22 NOTE — Progress Notes (Signed)
   11/22/22 1425  Spiritual Encounters  Type of Visit Initial  Care provided to: Patient  Conversation partners present during encounter Nurse  Referral source Nurse (RN/NT/LPN)  Reason for visit Advance directives  OnCall Visit Yes  Spiritual Framework  Presenting Themes Meaning/purpose/sources of inspiration;Rituals and practive  Patient Stress Factors None identified  Family Stress Factors None identified  Goals  Self/Personal Goals recovery  Advance Directives (For Healthcare)  Does Patient Have a Medical Advance Directive? No  Would patient like information on creating a medical advance directive? Yes (Inpatient - patient defers creating a medical advance directive and declines information at this time)   Visited patient for advance directive. Explained directive. Patient has not discussed with her sons and would like to talk to them first. Provided her with fors as she stated she could have her bank notarize them and the return.

## 2022-11-22 NOTE — TOC Progression Note (Signed)
Transition of Care [facility]) - Progression Note    Patient Details  Name: [name] [name] MRN: [id number] Date of Birth: 1941-09-15  [facility]) CM/SW Contact  Zenon Mayo, RN Phone Number: 11/22/2022, 11:15 AM  Clinical Narrative:    Patient states she would like to have Advance Directives done.  NCM paged Chaplain for patient.        Expected Discharge Plan and Services                                               Social Determinants of Health (SDOH) Interventions SDOH Screenings   Tobacco Use: Low Risk  (11/21/2022)    Readmission Risk Interventions     No data to display

## 2022-11-22 NOTE — Discharge Instructions (Signed)
     Supplemental Discharge Instructions for  Pacemaker/Defibrillator Patients   Activity No heavy lifting or vigorous activity with your left/right arm for 6 to 8 weeks.  Do not raise your left/right arm above your head for one week.  Gradually raise your affected arm as drawn below.              11/27/22                   11/28/22                   11/29/22                      11/30/22 __  NO DRIVING for  1 week   ; you may begin driving on  [id number]   .  WOUND CARE Keep the wound area clean and dry.  Do not get this area wet , no showers for one week; you may shower on   11/30/22  . The tape/steri-strips on your wound will fall off; do not pull them off.  No bandage is needed on the site.  DO  NOT apply any creams, oils, or ointments to the wound area. If you notice any drainage or discharge from the wound, any swelling or bruising at the site, or you develop a fever > 101? F after you are discharged home, call the office at once.  Special Instructions You are still able to use cellular telephones; use the ear opposite the side where you have your pacemaker/defibrillator.  Avoid carrying your cellular phone near your device. When traveling through airports, show security personnel your identification card to avoid being screened in the metal detectors.  Ask the security personnel to use the hand wand. Avoid arc welding equipment, MRI testing (magnetic resonance imaging), TENS units (transcutaneous nerve stimulators).  Call the office for questions about other devices. Avoid electrical appliances that are in poor condition or are not properly grounded. Microwave ovens are safe to be near or to operate.

## 2022-11-22 NOTE — Care Management Obs Status (Signed)
[place] NOTIFICATION   Patient Details  Name: [name] [name] MRN: [id number] Date of Birth: 1940-10-26   Medicare Observation Status Notification Given:  Yes    Zenon Mayo, RN 11/22/2022, 11:10 AM

## 2022-11-22 NOTE — Progress Notes (Addendum)
Rounding Note    Patient Name: [name] [name] Date of Encounter: 11/22/2022  [place] Cardiologist: Dr. Bettina Gavia  Subjective   Feels well, no symptoms at baseline  Inpatient Medications    Scheduled Meds:  chlorhexidine  60 mL Topical Once   dapagliflozin propanediol  10 mg Oral Daily   fluticasone  1-2 spray Each Nare Daily   fluticasone furoate-vilanterol  1 puff Inhalation Daily   And   umeclidinium bromide  1 puff Inhalation Daily   gabapentin  100 mg Oral BID   gentamicin (GARAMYCIN) 80 mg in sodium chloride 0.9 % 500 mL irrigation  80 mg Irrigation On Call   insulin aspart  0-9 Units Subcutaneous TID WC   levothyroxine  88 mcg Oral QAC breakfast   rosuvastatin  10 mg Oral Daily   sodium chloride flush  3 mL Intravenous Q12H   Continuous Infusions:  sodium chloride Stopped (11/21/22 1536)   sodium chloride Stopped (11/21/22 1536)   vancomycin Stopped (11/21/22 1535)   PRN Meds: acetaminophen, albuterol, ondansetron (ZOFRAN) IV, sodium chloride flush   Vital Signs    Vitals:   11/22/22 0300 11/22/22 0435 11/22/22 0820 11/22/22 1101  BP: (!) 131/57  131/79 (!) 147/53  Pulse: (!) 36 (!) 35 (!) 35 (!) 34  Resp: '19 15 18 17  '$ Temp: 98.1 F (36.7 C)  98.1 F (36.7 C) 97.8 F (36.6 C)  TempSrc: Oral  Oral Oral  SpO2: 90% 96% 96% 96%  Weight:      Height:        Intake/Output Summary (Last 24 hours) at 11/22/2022 1111 Last data filed at 11/21/2022 2330 Gross per 24 hour  Intake 610 ml  Output 250 ml  Net 360 ml      11/21/2022    4:00 PM 11/21/2022   11:38 AM 11/21/2022    9:51 AM  Last 3 Weights  Weight (lbs) 198 lb 10.2 oz 199 lb 199 lb  Weight (kg) 90.1 kg 90.266 kg 90.266 kg      Telemetry    2:1 and CHB, rates 30's - Personally Reviewed  ECG    No new EKGs - Personally Reviewed  Physical Exam   GEN: No acute distress.   Neck: No JVD Cardiac: RRR, bradycardic,  2/6 SM, no rubs, or gallops.  Respiratory: CTA  b/l. GI: Soft, nontender, non-distended  MS: No edema; No deformity. Neuro:  Nonfocal  Psych: Normal affect   Labs    High Sensitivity Troponin:   Recent Labs  Lab 11/21/22 1148 11/21/22 1346  TROPONINIHS 25* 26*     Chemistry Recent Labs  Lab 11/21/22 1148 11/22/22 0013  NA 140 137  K 3.9 3.9  CL 106 105  CO2 25 23  GLUCOSE 111* 103*  BUN 21 22  CREATININE 1.23* 1.15*  CALCIUM 9.1 8.9  MG 2.6*  --   GFRNONAA 45* 48*  ANIONGAP 9 9    Lipids No results for input(s): "CHOL", "TRIG", "HDL", "LABVLDL", "LDLCALC", "CHOLHDL" in the last 168 hours.  Hematology Recent Labs  Lab 11/21/22 1148  WBC 4.1  RBC 4.66  HGB 15.2*  HCT 47.2*  MCV 101.3*  MCH 32.6  MCHC 32.2  RDW 14.6  PLT 103*   Thyroid No results for input(s): "TSH", "FREET4" in the last 168 hours.  BNPNo results for input(s): "BNP", "PROBNP" in the last 168 hours.  DDimer No results for input(s): "DDIMER" in the last 168 hours.   Radiology  Cardiac Studies    11/21/22: TTE 1. Left ventricular ejection fraction, by estimation, is 65%. The left  ventricle has normal function. The left ventricle has no regional wall  motion abnormalities. There is severe left ventricular hypertrophy. Left  ventricular diastolic parameters are  indeterminate.   2. Right ventricular systolic function is normal. The right ventricular  size is normal. There is moderately elevated pulmonary artery systolic  pressure. The estimated right ventricular systolic pressure is [id number] mmHg.   3. Left atrial size was severely dilated.   4. Right atrial size was mildly dilated.   5. The mitral valve is degenerative. Mild systolic and diastolic mitral  valve regurgitation. Mild to moderate mitral stenosis. The mean mitral  valve gradient is 5.0 mmHg with average heart rate of 35 bpm. Severe,  exuberant mitral annular calcification.   6. Tricuspid valve regurgitation is mild to moderate.   7. The aortic valve is abnormal. There  is severe calcifcation of the  aortic valve. Aortic valve regurgitation is trivial. Moderate to severe  aortic valve stenosis. Aortic valve mean gradient measures 33.0 mmHg.  Aortic valve Vmax measures 3.60 m/s. Valve   area and DVI calculation impacted by high LVOT TVI.   8. The inferior vena cava is dilated in size with >50% respiratory  variability, suggesting right atrial pressure of 8 mmHg.   9. Trivial pericardial effusion.     04/23/22: TTE 1. Left ventricular ejection fraction, by estimation, is 60 to 65%. The  left ventricle has normal function. The left ventricle has no regional  wall motion abnormalities. There is moderate left ventricular hypertrophy.  Left ventricular diastolic  parameters are consistent with Grade I diastolic dysfunction (impaired  relaxation).   2. Right ventricular systolic function is normal. The right ventricular  size is normal. There is normal pulmonary artery systolic pressure.   3. The mitral valve is normal in structure. No evidence of mitral valve  regurgitation. Mild to moderate mitral stenosis.   4. The aortic valve is normal in structure. There is mild calcification  of the aortic valve. There is mild thickening of the aortic valve. Aortic  valve regurgitation is not visualized. Mild to moderate aortic valve  stenosis. Aortic valve area, by VTI  measures 1.51 cm. Aortic valve mean gradient measures 26.5 mmHg.   5. The inferior vena cava is normal in size with greater than 50%  respiratory variability, suggesting right atrial pressure of 3 mmHg.   Patient Profile     82 y.o. female  with HTN, COPD, VHD w/AS, CKD (III), DM, HLD, hypothyroid, chronic CHF (diastolic), admitted with CHB  Assessment & Plan    CHB Baseline conduction system disease No reversible causes No nodal blocking agents on home meds  For PPM today She remains agreeable Dr. Myles Gip has seen her this morning   VHD Mild-mod MS Mod-sever AS Will need outpt follow  up with Dr. Bettina Gavia for surveillance/management    HTN Stable BP Hold meds   DM SSI   COPD Home regime No SOB No wheezing  7.  Chronic CHF (diastolic) She appears compensated Severe LVH Indeterminate LVEDP RV 47.51mHg  For questions or updates, [place] [email or web address] for contact info under        Signed, RBaldwin Jamaica PA-C  11/22/2022, 11:11 AM    I saw and examined the patient today. She remains bradycardic with 2:1 AV conduction without reversible cause. We will proceed with pacemaker placement today. I discussed  the indication for the procedure and the logistics, risks, potential benefit, and after care. I specifically explained that risks include but are not limited to infection, bleeding,damage to blood vessels, lung, and the heart -- but risk of prolonged hospitalization, need for surgery, or the event of stroke, heart attack, or death are low but not zero.

## 2022-11-23 ENCOUNTER — Encounter (HOSPITAL_COMMUNITY): Payer: Self-pay | Admitting: Cardiovascular Disease

## 2022-11-23 ENCOUNTER — Inpatient Hospital Stay (HOSPITAL_COMMUNITY): Payer: Medicare HMO

## 2022-11-23 DIAGNOSIS — I442 Atrioventricular block, complete: Secondary | ICD-10-CM | POA: Diagnosis not present

## 2022-11-23 LAB — GLUCOSE, CAPILLARY
Glucose-Capillary: 129 mg/dL — ABNORMAL HIGH (ref 70–99)
Glucose-Capillary: 95 mg/dL (ref 70–99)
Glucose-Capillary: 121 mg/dL — ABNORMAL HIGH (ref 70–99)

## 2022-11-23 MED ORDER — YOU HAVE A PACEMAKER BOOK
Freq: Once | Status: AC
  Filled 2022-11-23: qty 1

## 2022-11-23 NOTE — Progress Notes (Signed)
Rounding Note    Patient Name: [name] [name] Date of Encounter: 11/23/2022  [place] Cardiologist: None Mealor  Subjective   No chest pain or sob. Wants to go home.  Inpatient Medications    Scheduled Meds:  dapagliflozin propanediol  10 mg Oral Daily   fluticasone  1-2 spray Each Nare Daily   fluticasone furoate-vilanterol  1 puff Inhalation Daily   And   umeclidinium bromide  1 puff Inhalation Daily   gabapentin  100 mg Oral BID   insulin aspart  0-9 Units Subcutaneous TID WC   levothyroxine  88 mcg Oral QAC breakfast   rosuvastatin  10 mg Oral Daily   sodium chloride flush  3 mL Intravenous Q12H   Continuous Infusions:  PRN Meds: acetaminophen, albuterol, ondansetron (ZOFRAN) IV   Vital Signs    Vitals:   11/22/22 1751 11/22/22 2314 11/23/22 0320 11/23/22 0757  BP: (!) 146/57 (!) 178/72 (!) 140/61 (!) 107/50  Pulse: 61 72 61   Resp: 16 (!) 22 18   Temp: 98.6 F (37 C) 99.5 F (37.5 C) 99.3 F (37.4 C) 98.2 F (36.8 C)  TempSrc: Oral Oral Oral Oral  SpO2: 95% 97% 96%   Weight:      Height:        Intake/Output Summary (Last 24 hours) at 11/23/2022 1048 Last data filed at 11/23/2022 1000 Gross per 24 hour  Intake 800 ml  Output 1900 ml  Net -1100 ml      11/21/2022    4:00 PM 11/21/2022   11:38 AM 11/21/2022    9:51 AM  Last 3 Weights  Weight (lbs) 198 lb 10.2 oz 199 lb 199 lb  Weight (kg) 90.1 kg 90.266 kg 90.266 kg      Telemetry    Nsr with AV pacing - Personally Reviewed  ECG    AV paced - Personally Reviewed  Physical Exam   GEN: No acute distress.   Neck: No JVD Cardiac: RRR, no murmurs, rubs, or gallops.  Respiratory: Clear to auscultation bilaterally. GI: Soft, nontender, non-distended  MS: No edema; No deformity. Neuro:  Nonfocal  Psych: Normal affect   Labs    High Sensitivity Troponin:   Recent Labs  Lab 11/21/22 1148 11/21/22 1346  TROPONINIHS 25* 26*     Chemistry Recent Labs  Lab  11/21/22 1148 11/22/22 0013  NA 140 137  K 3.9 3.9  CL 106 105  CO2 25 23  GLUCOSE 111* 103*  BUN 21 22  CREATININE 1.23* 1.15*  CALCIUM 9.1 8.9  MG 2.6*  --   GFRNONAA 45* 48*  ANIONGAP 9 9    Lipids No results for input(s): "CHOL", "TRIG", "HDL", "LABVLDL", "LDLCALC", "CHOLHDL" in the last 168 hours.  Hematology Recent Labs  Lab 11/21/22 1148  WBC 4.1  RBC 4.66  HGB 15.2*  HCT 47.2*  MCV 101.3*  MCH 32.6  MCHC 32.2  RDW 14.6  PLT 103*   Thyroid No results for input(s): "TSH", "FREET4" in the last 168 hours.  BNPNo results for input(s): "BNP", "PROBNP" in the last 168 hours.  DDimer No results for input(s): "DDIMER" in the last 168 hours.   Radiology    DG Chest 2 View  Result Date: 11/23/2022 CLINICAL DATA:  [id number] with cardiac device in place. EXAM: CHEST - 2 VIEW COMPARISON:  Portable 11/21/2022. FINDINGS: The heart is moderately enlarged, without overt findings of CHF. No pleural effusion is seen. The lungs are clear. There is  new demonstration of a left chest dual pacing system with 1 wire terminating in the right atrium and the other in the right ventricle. there is no pneumothorax. There is osteopenia, thoracic spondylosis and a reverse left shoulder arthroplasty. IMPRESSION: 1. No evidence of acute chest disease. 2. Cardiomegaly. 3. New demonstration of a left chest dual pacing system. Electronically Signed   By: Telford Nab M.D.   On: 11/23/2022 06:40   EP PPM/ICD IMPLANT  Result Date: 11/22/2022 Table formatting from the original result was not included.  SURGEON:  Doralee Albino, MD    PREPROCEDURE DIAGNOSES:  1. Irreversible symptomatic bradycardia due to complete heart block    POSTPROCEDURE DIAGNOSES:  1. Irreversible symptomatic bradycardia due to complete heart block    PROCEDURES:   1. Dual chamber permanent pacemaker implantation    INTRODUCTION: [name] [name] is an 82 y.o. patient who presents to the EP lab for dual chamber permanent  pacemaker implantation.    DESCRIPTION OF PROCEDURE:  Informed written consent was obtained and the patient was brought to the electrophysiology lab in the fasting state. The patient was adequately sedated with intravenous Versed, and fentanyl as outlined in the nursing report.  The patient's left chest was prepped and draped in the usual sterile fashion by the EP lab staff.  The skin overlying the left deltopectoral region was infiltrated with lidocaine for local analgesia.  An incision was created over the left deltopectoral region.  A left subcutaneous pocket was fashioned using a combination of sharp and blunt dissection immediately superficial to the pectoral fascia.  Electrocautery was used to assure hemostasis. Lead Placement: The left axillary vein was cannulated using modified seldinger techniques.  Through the left axillary vein, an RV pace/sense lead was advanced to the RV mid septum and advanced deeply, pausing intermittently to assess lead parameters. The lead was secured to the pectoral fascia. Next, the right atrial lead was advanced to the RA appendage. It was secured to the pectoral fascia.  Lead parameters are detailed below.  RA RV Model [id number] Serial # [id number] V Amplitude 1.3 mV NA mV Threshold 2.0 V @ 0.4 mS 0.75 V @ 0.4 mS Impedence 551 ohms 798 ohms The pocket was irrigated with copious gentamicin solution.  The leads were then  connected to a pulse generator (model Azure, serial [id number]). The pocket was closed in layers of absorbable suture. EBL < 39m. Steri-strips and a sterile dressing were applied.  During this procedure the patient is administered Versed and Fentanyl by a trained provider under my direct supervision to achieve and maintain moderate conscious sedation.  The patient's heart rate, blood pressure, and oxygen saturation are monitored continuously during the procedure. The period of conscious sedation is 73 minutes, of which I was present face-to-face 100% of  this time.    CONCLUSIONS:  1. Successful dual chamber permanent pacemaker implantation  3.  No early apparent complications. ADoralee Albino MD Cardiac Electrophysiology  ECHOCARDIOGRAM COMPLETE  Result Date: 11/21/2022    ECHOCARDIOGRAM REPORT   Patient Name:   [name] SamaDate of Exam: 11/21/2022 Medical Rec #:  [id number]                   Height:       63.5 in Accession #:    [id number]                  Weight:       199.0 lb Date of  Birth:  1940/11/24                   BSA:          1.941 m Patient Age:    31 years                     BP:           143/69 mmHg Patient Gender: F                            HR:           34 bpm. Exam Location:  Inpatient Procedure: 2D Echo, Cardiac Doppler and Color Doppler Indications:    Bradycardia  History:        Patient has prior history of Echocardiogram examinations. CHF,                 COPD, Aortic Valve Disease and Mitral Valve Disease; Risk                 Factors:Diabetes, Hypertension and Dyslipidemia.  Sonographer:    Meagan Baucom RDCS, FE, PE Referring Phys: RENEE LYNN URSUY IMPRESSIONS  1. Left ventricular ejection fraction, by estimation, is 65%. The left ventricle has normal function. The left ventricle has no regional wall motion abnormalities. There is severe left ventricular hypertrophy. Left ventricular diastolic parameters are indeterminate.  2. Right ventricular systolic function is normal. The right ventricular size is normal. There is moderately elevated pulmonary artery systolic pressure. The estimated right ventricular systolic pressure is [id number] mmHg.  3. Left atrial size was severely dilated.  4. Right atrial size was mildly dilated.  5. The mitral valve is degenerative. Mild systolic and diastolic mitral valve regurgitation. Mild to moderate mitral stenosis. The mean mitral valve gradient is 5.0 mmHg with average heart rate of 35 bpm. Severe, exuberant mitral annular calcification.  6. Tricuspid valve regurgitation is mild to  moderate.  7. The aortic valve is abnormal. There is severe calcifcation of the aortic valve. Aortic valve regurgitation is trivial. Moderate to severe aortic valve stenosis. Aortic valve mean gradient measures 33.0 mmHg. Aortic valve Vmax measures 3.60 m/s. Valve  area and DVI calculation impacted by high LVOT TVI.  8. The inferior vena cava is dilated in size with >50% respiratory variability, suggesting right atrial pressure of 8 mmHg.  9. Trivial pericardial effusion. FINDINGS  Left Ventricle: Left ventricular ejection fraction, by estimation, is 65%. The left ventricle has normal function. The left ventricle has no regional wall motion abnormalities. The left ventricular internal cavity size was normal in size. There is severe left ventricular hypertrophy. Left ventricular diastolic function could not be evaluated due to mitral annular calcification (moderate or greater). Left ventricular diastolic parameters are indeterminate. Right Ventricle: The right ventricular size is normal. No increase in right ventricular wall thickness. Right ventricular systolic function is normal. There is moderately elevated pulmonary artery systolic pressure. The tricuspid regurgitant velocity is 3.16 m/s, and with an assumed right atrial pressure of 8 mmHg, the estimated right ventricular systolic pressure is [id number] mmHg. Left Atrium: Left atrial size was severely dilated. Right Atrium: Right atrial size was mildly dilated. Pericardium: There is no evidence of pericardial effusion. Mitral Valve: The mitral valve is degenerative in appearance. Severe mitral annular calcification. Mild systolic and diastolic mitral valve regurgitation. Mild to moderate mitral valve stenosis. MV peak gradient, 16.2 mmHg. The mean mitral valve gradient  is 5.0 mmHg with average heart rate of 35 bpm. Tricuspid Valve: The tricuspid valve is normal in structure. Tricuspid valve regurgitation is mild to moderate. No evidence of tricuspid stenosis. Aortic  Valve: The aortic valve is abnormal. There is severe calcifcation of the aortic valve. Aortic valve regurgitation is trivial. Moderate to severe aortic stenosis is present. Aortic valve mean gradient measures 33.0 mmHg. Aortic valve peak gradient measures 51.8 mmHg. Aortic valve area, by VTI measures 0.90 cm. Pulmonic Valve: The pulmonic valve was not well visualized. Pulmonic valve regurgitation is trivial. No evidence of pulmonic stenosis. Aorta: The aortic root is normal in size and structure. Venous: The inferior vena cava is dilated in size with greater than 50% respiratory variability, suggesting right atrial pressure of 8 mmHg. IAS/Shunts: The atrial septum is grossly normal.  LEFT VENTRICLE PLAX 2D LVIDd:         3.50 cm   Diastology LVIDs:         2.10 cm   LV e' medial:    7.07 cm/s LV PW:         1.30 cm   LV E/e' medial:  35.6 LV IVS:        1.50 cm   LV e' lateral:   4.79 cm/s LVOT diam:     1.60 cm   LV E/e' lateral: 52.6 LV SV:         103 LV SV Index:   53 LVOT Area:     2.01 cm  RIGHT VENTRICLE RV S prime:     10.00 cm/s TAPSE (M-mode): 1.6 cm LEFT ATRIUM              Index        RIGHT ATRIUM           Index LA diam:        5.00 cm  2.58 cm/m   RA Area:     20.50 cm LA Vol (A2C):   178.0 ml 91.71 ml/m  RA Volume:   61.60 ml  31.74 ml/m LA Vol (A4C):   167.0 ml 86.04 ml/m LA Biplane Vol: 173.0 ml 89.13 ml/m  AORTIC VALVE AV Area (Vmax):    0.92 cm AV Area (Vmean):   0.90 cm AV Area (VTI):     0.90 cm AV Vmax:           360.00 cm/s AV Vmean:          260.000 cm/s AV VTI:            1.140 m AV Peak Grad:      51.8 mmHg AV Mean Grad:      33.0 mmHg LVOT Vmax:         164.00 cm/s LVOT Vmean:        117.000 cm/s LVOT VTI:          0.511 m LVOT/AV VTI ratio: 0.45  AORTA Ao Root diam: 2.90 cm MITRAL VALVE               TRICUSPID VALVE MV Area (PHT): 2.75 cm    TV Peak grad:   24.0 mmHg MV Peak grad:  16.2 mmHg   TV Vmax:        2.45 m/s MV Mean grad:  5.0 mmHg    TR Peak grad:   39.9 mmHg MV  Vmax:       2.01 m/s    TR Vmax:        316.00 cm/s MV Vmean:  101.0 cm/s MV Decel Time: 276 msec    SHUNTS                            Systemic VTI:  0.51 m                            Systemic Diam: 1.60 cm Cherlynn Kaiser MD Electronically signed by Cherlynn Kaiser MD Signature Date/Time: 11/21/2022/4:28:09 PM    Final    DG Chest Port 1 View  Result Date: 11/21/2022 CLINICAL DATA:  Weakness EXAM: PORTABLE CHEST 1 VIEW COMPARISON:  09/06/2021 FINDINGS: Enlargement of the cardiac silhouette, which has increased from prior. Aortic atherosclerosis. Pulmonary vascular congestion. Minimal streaky left basilar opacity. No pleural effusion or pneumothorax. IMPRESSION: 1. Enlargement of the cardiac silhouette, which has increased from prior. Pericardial effusion is not excluded. 2. Pulmonary vascular congestion. Electronically Signed   By: Davina Poke D.O.   On: 11/21/2022 12:31    Cardiac Studies   See above  Patient Profile     82 y.o. female admitted with CHB, s/p PPM   Assessment & Plan    CHB - she is asymptomatic s/p PPM insertion PPM - her medtronic DDD PM is working normally.  Disp - ok to dc home. Usual followup. [place] will sign off.   Medication Recommendations:  none Other recommendations (labs, testing, etc):  none Follow up as an outpatient:  2 weeks in PM clinic and 3 months with Dr. Myles Gip  For questions or updates, please contact [place] Please consult [email or web address] for contact info under     Signed, Cristopher Peru, MD  11/23/2022, 10:48 AM

## 2022-11-23 NOTE — Discharge Summary (Cosign Needed)
Discharge Summary    Patient ID: [name] [name] MRN: [id number]; DOB: [birth year]  Admit date: 11/21/2022 Discharge date: 11/23/2022  PCP:  Marco Collie, [place] Providers Cardiologist:  None  Electrophysiologist:  Melida Quitter, MD       Discharge Diagnoses    Principal Problem:   Complete heart block [facility])    Diagnostic Studies/Procedures    ECHO: 11/22/2022   1. Left ventricular ejection fraction, by estimation, is 65%. The left ventricle has normal function. The left ventricle has no regional wall motion abnormalities. There is severe left ventricular hypertrophy. Left ventricular diastolic parameters are  indeterminate.   2. Right ventricular systolic function is normal. The right ventricular size is normal. There is moderately elevated pulmonary artery systolic pressure. The estimated right ventricular systolic pressure is [id number] mmHg.   3. Left atrial size was severely dilated.   4. Right atrial size was mildly dilated.   5. The mitral valve is degenerative. Mild systolic and diastolic mitral valve regurgitation. Mild to moderate mitral stenosis. The mean mitral valve gradient is 5.0 mmHg with average heart rate of 35 bpm. Severe, exuberant mitral annular calcification.   6. Tricuspid valve regurgitation is mild to moderate.   7. The aortic valve is abnormal. There is severe calcifcation of the aortic valve. Aortic valve regurgitation is trivial. Moderate to severe aortic valve stenosis. Aortic valve mean gradient measures 33.0 mmHg. Aortic valve Vmax measures 3.60 m/s. Valve  area and DVI calculation impacted by high LVOT TVI.   8. The inferior vena cava is dilated in size with >50% respiratory  variability, suggesting right atrial pressure of 8 mmHg.   9. Trivial pericardial effusion.    PACEMAKER IMPLANTATION 11/22/2022 PREPROCEDURE DIAGNOSES:   1. Irreversible symptomatic bradycardia due to complete heart block     POSTPROCEDURE  DIAGNOSES:   1. Irreversible symptomatic bradycardia due to complete heart block     PROCEDURES:    1. Dual chamber permanent pacemaker implantation     INTRODUCTION: [name] [name] is a 82 y.o. patient who presents to the EP lab for dual chamber permanent pacemaker implantation.      DESCRIPTION OF PROCEDURE:  Informed written consent was obtained and the patient was brought to the electrophysiology lab in the fasting state. The patient was adequately sedated with intravenous Versed, and fentanyl as outlined in the nursing report.  The patient's left chest was prepped and draped in the usual sterile fashion by the EP lab staff.  The skin overlying the left deltopectoral region was infiltrated with lidocaine for local analgesia.  An incision was created over the left deltopectoral region.  A left subcutaneous pocket was fashioned using a combination of sharp and blunt dissection immediately superficial to the pectoral fascia.  Electrocautery was used to assure hemostasis.    Lead Placement: The left axillary vein was cannulated using modified seldinger techniques.  Through the left axillary vein, an RV pace/sense lead was advanced to the RV mid septum and advanced deeply, pausing intermittently to assess lead parameters. The lead was secured to the pectoral fascia. Next, the right atrial lead was advanced to the RA appendage. It was secured to the pectoral fascia.  Lead parameters are detailed below.     RA RV  Model 6076 3830  Serial # [id number]  Amplitude 1.3 mV NA mV  Threshold 2.0 V @ 0.4 mS 0.75 V @ 0.4 mS  Impedence 551 ohms 798 ohms  The pocket was irrigated with copious gentamicin solution.  The leads were then  connected to a pulse generator (model Azure, serial [id number]). The pocket was closed in layers of absorbable suture. EBL < 74m. Steri-strips and a sterile dressing were applied.   During this procedure the patient is administered Versed and  Fentanyl by a trained provider under my direct supervision to achieve and maintain moderate conscious sedation.  The patient's heart rate, blood pressure, and oxygen saturation are monitored continuously during the procedure. The period of conscious sedation is 73 minutes, of which I was present face-to-face 100% of this time.       CONCLUSIONS:   1. Successful dual chamber permanent pacemaker implantation  3.  No early apparent complications.      ADoralee Albino MD Cardiac Electrophysiology _____________   History of Present Illness     [name] a 82y.o. female with  HTN, COPD, VHD w/AS, CKD (III), DM, HLD, hypothyroid, who was admitted 11/21/2022 for the evaluation of advanced heart block.   Hospital Course     Consultants: None   She was admitted by EP, Dr MMyles Gip  It was felt there was no reversible cause to her heart block and a PPM was indicated.  She had a Medtronic Azure dual-lead PPM inserted on [id number] without complication.  On 02/24, she was seen by Dr TLovena Leand all data were reviewed.  Her CXR was without PTX. Her site was stable and she ambulated well.  She is therefore considered stable for discharge, to follow up as an outpatient.       Did the patient have an acute coronary syndrome (MI, NSTEMI, STEMI, etc) this admission?:  No                               Did the patient have a percutaneous coronary intervention (stent / angioplasty)?:  No.      _____________  Discharge Vitals Blood pressure 139/70, pulse 64, temperature 97.9 F (36.6 C), temperature source Oral, resp. rate 20, height 5' 3.5" (1.613 m), weight 90.1 kg, SpO2 94 %.  Filed Weights   11/21/22 1138 11/21/22 1600  Weight: 90.3 kg 90.1 kg    Labs & Radiologic Studies    CBC Recent Labs    11/21/22 1148  WBC 4.1  NEUTROABS 1.7  HGB 15.2*  HCT 47.2*  MCV 101.3*  PLT [id number]   Basic Metabolic Panel Recent Labs    11/21/22 1148 11/22/22 0013  NA 140 137   K 3.9 3.9  CL 106 105  CO2 25 23  GLUCOSE 111* 103*  BUN 21 22  CREATININE 1.23* 1.15*  CALCIUM 9.1 8.9  MG 2.6*  --    Liver Function Tests No results for input(s): "AST", "ALT", "ALKPHOS", "BILITOT", "PROT", "ALBUMIN" in the last 72 hours. No results for input(s): "LIPASE", "AMYLASE" in the last 72 hours. High Sensitivity Troponin:   Recent Labs  Lab 11/21/22 1148 11/21/22 1346  TROPONINIHS 25* 26*    BNP Invalid input(s): "POCBNP" D-Dimer No results for input(s): "DDIMER" in the last 72 hours. Hemoglobin A1C Recent Labs    11/21/22 1905  HGBA1C 5.9*   Fasting Lipid Panel No results for input(s): "CHOL", "HDL", "LDLCALC", "TRIG", "CHOLHDL", "LDLDIRECT" in the last 72 hours. Thyroid Function Tests No results for input(s): "TSH", "T4TOTAL", "T3FREE", "THYROIDAB" in the last 72 hours.  Invalid input(s): "FREET3" _____________  DG Chest 2 View  Result Date: 11/23/2022 CLINICAL DATA:  [id number] with cardiac device in place. EXAM: CHEST - 2 VIEW COMPARISON:  Portable 11/21/2022. FINDINGS: The heart is moderately enlarged, without overt findings of CHF. No pleural effusion is seen. The lungs are clear. There is new demonstration of a left chest dual pacing system with 1 wire terminating in the right atrium and the other in the right ventricle. there is no pneumothorax. There is osteopenia, thoracic spondylosis and a reverse left shoulder arthroplasty. IMPRESSION: 1. No evidence of acute chest disease. 2. Cardiomegaly. 3. New demonstration of a left chest dual pacing system. Electronically Signed   By: Telford Nab M.D.   On: 11/23/2022 06:40   EP PPM/ICD IMPLANT  Result Date: 11/22/2022 Table formatting from the original result was not included.  SURGEON:  Doralee Albino, MD    PREPROCEDURE DIAGNOSES:  1. Irreversible symptomatic bradycardia due to complete heart block    POSTPROCEDURE DIAGNOSES:  1. Irreversible symptomatic bradycardia due to complete heart block     PROCEDURES:   1. Dual chamber permanent pacemaker implantation    INTRODUCTION: Prentiss Creer is a 82 y.o. patient who presents to the EP lab for dual chamber permanent pacemaker implantation.    DESCRIPTION OF PROCEDURE:  Informed written consent was obtained and the patient was brought to the electrophysiology lab in the fasting state. The patient was adequately sedated with intravenous Versed, and fentanyl as outlined in the nursing report.  The patient's left chest was prepped and draped in the usual sterile fashion by the EP lab staff.  The skin overlying the left deltopectoral region was infiltrated with lidocaine for local analgesia.  An incision was created over the left deltopectoral region.  A left subcutaneous pocket was fashioned using a combination of sharp and blunt dissection immediately superficial to the pectoral fascia.  Electrocautery was used to assure hemostasis. Lead Placement: The left axillary vein was cannulated using modified seldinger techniques.  Through the left axillary vein, an RV pace/sense lead was advanced to the RV mid septum and advanced deeply, pausing intermittently to assess lead parameters. The lead was secured to the pectoral fascia. Next, the right atrial lead was advanced to the RA appendage. It was secured to the pectoral fascia.  Lead parameters are detailed below.  RA RV Model 6076 3830 Serial # [id number] Amplitude 1.3 mV NA mV Threshold 2.0 V @ 0.4 mS 0.75 V @ 0.4 mS Impedence 551 ohms 798 ohms The pocket was irrigated with copious gentamicin solution.  The leads were then  connected to a pulse generator (model Azure, serial [id number]). The pocket was closed in layers of absorbable suture. EBL < 34m. Steri-strips and a sterile dressing were applied.  During this procedure the patient is administered Versed and Fentanyl by a trained provider under my direct supervision to achieve and maintain moderate conscious sedation.  The patient's heart  rate, blood pressure, and oxygen saturation are monitored continuously during the procedure. The period of conscious sedation is 73 minutes, of which I was present face-to-face 100% of this time.    CONCLUSIONS:  1. Successful dual chamber permanent pacemaker implantation  3.  No early apparent complications. ADoralee Albino MD Cardiac Electrophysiology  ECHOCARDIOGRAM COMPLETE  Result Date: 11/21/2022    ECHOCARDIOGRAM REPORT   Patient Name:   BAlira SquicciariniDate of Exam: 11/21/2022 Medical Rec #:  [id number]                   Height:  63.5 in Accession #:    [id number]                   Weight:       199.0 lb Date of Birth:  1940/12/25                   BSA:          1.941 m Patient Age:    82 years                     BP:           143/69 mmHg Patient Gender: F                            HR:           34 bpm. Exam Location:  Inpatient Procedure: 2D Echo, Cardiac Doppler and Color Doppler Indications:    Bradycardia  History:        Patient has prior history of Echocardiogram examinations. CHF,                 COPD, Aortic Valve Disease and Mitral Valve Disease; Risk                 Factors:Diabetes, Hypertension and Dyslipidemia.  Sonographer:    Meagan Baucom RDCS, FE, PE Referring Phys: RENEE LYNN URSUY IMPRESSIONS  1. Left ventricular ejection fraction, by estimation, is 65%. The left ventricle has normal function. The left ventricle has no regional wall motion abnormalities. There is severe left ventricular hypertrophy. Left ventricular diastolic parameters are indeterminate.  2. Right ventricular systolic function is normal. The right ventricular size is normal. There is moderately elevated pulmonary artery systolic pressure. The estimated right ventricular systolic pressure is A999333 mmHg.  3. Left atrial size was severely dilated.  4. Right atrial size was mildly dilated.  5. The mitral valve is degenerative. Mild systolic and diastolic mitral valve regurgitation. Mild to moderate  mitral stenosis. The mean mitral valve gradient is 5.0 mmHg with average heart rate of 35 bpm. Severe, exuberant mitral annular calcification.  6. Tricuspid valve regurgitation is mild to moderate.  7. The aortic valve is abnormal. There is severe calcifcation of the aortic valve. Aortic valve regurgitation is trivial. Moderate to severe aortic valve stenosis. Aortic valve mean gradient measures 33.0 mmHg. Aortic valve Vmax measures 3.60 m/s. Valve  area and DVI calculation impacted by high LVOT TVI.  8. The inferior vena cava is dilated in size with >50% respiratory variability, suggesting right atrial pressure of 8 mmHg.  9. Trivial pericardial effusion. FINDINGS  Left Ventricle: Left ventricular ejection fraction, by estimation, is 65%. The left ventricle has normal function. The left ventricle has no regional wall motion abnormalities. The left ventricular internal cavity size was normal in size. There is severe left ventricular hypertrophy. Left ventricular diastolic function could not be evaluated due to mitral annular calcification (moderate or greater). Left ventricular diastolic parameters are indeterminate. Right Ventricle: The right ventricular size is normal. No increase in right ventricular wall thickness. Right ventricular systolic function is normal. There is moderately elevated pulmonary artery systolic pressure. The tricuspid regurgitant velocity is 3.16 m/s, and with an assumed right atrial pressure of 8 mmHg, the estimated right ventricular systolic pressure is [id number] mmHg. Left Atrium: Left atrial size was severely dilated. Right Atrium: Right atrial size was mildly dilated. Pericardium: There is no evidence of pericardial  effusion. Mitral Valve: The mitral valve is degenerative in appearance. Severe mitral annular calcification. Mild systolic and diastolic mitral valve regurgitation. Mild to moderate mitral valve stenosis. MV peak gradient, 16.2 mmHg. The mean mitral valve gradient  is 5.0 mmHg  with average heart rate of 35 bpm. Tricuspid Valve: The tricuspid valve is normal in structure. Tricuspid valve regurgitation is mild to moderate. No evidence of tricuspid stenosis. Aortic Valve: The aortic valve is abnormal. There is severe calcifcation of the aortic valve. Aortic valve regurgitation is trivial. Moderate to severe aortic stenosis is present. Aortic valve mean gradient measures 33.0 mmHg. Aortic valve peak gradient measures 51.8 mmHg. Aortic valve area, by VTI measures 0.90 cm. Pulmonic Valve: The pulmonic valve was not well visualized. Pulmonic valve regurgitation is trivial. No evidence of pulmonic stenosis. Aorta: The aortic root is normal in size and structure. Venous: The inferior vena cava is dilated in size with greater than 50% respiratory variability, suggesting right atrial pressure of 8 mmHg. IAS/Shunts: The atrial septum is grossly normal.  LEFT VENTRICLE PLAX 2D LVIDd:         3.50 cm   Diastology LVIDs:         2.10 cm   LV e' medial:    7.07 cm/s LV PW:         1.30 cm   LV E/e' medial:  35.6 LV IVS:        1.50 cm   LV e' lateral:   4.79 cm/s LVOT diam:     1.60 cm   LV E/e' lateral: 52.6 LV SV:         103 LV SV Index:   53 LVOT Area:     2.01 cm  RIGHT VENTRICLE RV S prime:     10.00 cm/s TAPSE (M-mode): 1.6 cm LEFT ATRIUM              Index        RIGHT ATRIUM           Index LA diam:        5.00 cm  2.58 cm/m   RA Area:     20.50 cm LA Vol (A2C):   178.0 ml 91.71 ml/m  RA Volume:   61.60 ml  31.74 ml/m LA Vol (A4C):   167.0 ml 86.04 ml/m LA Biplane Vol: 173.0 ml 89.13 ml/m  AORTIC VALVE AV Area (Vmax):    0.92 cm AV Area (Vmean):   0.90 cm AV Area (VTI):     0.90 cm AV Vmax:           360.00 cm/s AV Vmean:          260.000 cm/s AV VTI:            1.140 m AV Peak Grad:      51.8 mmHg AV Mean Grad:      33.0 mmHg LVOT Vmax:         164.00 cm/s LVOT Vmean:        117.000 cm/s LVOT VTI:          0.511 m LVOT/AV VTI ratio: 0.45  AORTA Ao Root diam: 2.90 cm MITRAL VALVE                TRICUSPID VALVE MV Area (PHT): 2.75 cm    TV Peak grad:   24.0 mmHg MV Peak grad:  16.2 mmHg   TV Vmax:        2.45 m/s MV Mean grad:  5.0  mmHg    TR Peak grad:   39.9 mmHg MV Vmax:       2.01 m/s    TR Vmax:        316.00 cm/s MV Vmean:      101.0 cm/s MV Decel Time: 276 msec    SHUNTS                            Systemic VTI:  0.51 m                            Systemic Diam: 1.60 cm Cherlynn Kaiser MD Electronically signed by Cherlynn Kaiser MD Signature Date/Time: 11/21/2022/4:28:09 PM    Final    DG Chest Port 1 View  Result Date: 11/21/2022 CLINICAL DATA:  Weakness EXAM: PORTABLE CHEST 1 VIEW COMPARISON:  09/06/2021 FINDINGS: Enlargement of the cardiac silhouette, which has increased from prior. Aortic atherosclerosis. Pulmonary vascular congestion. Minimal streaky left basilar opacity. No pleural effusion or pneumothorax. IMPRESSION: 1. Enlargement of the cardiac silhouette, which has increased from prior. Pericardial effusion is not excluded. 2. Pulmonary vascular congestion. Electronically Signed   By: Davina Poke D.O.   On: 11/21/2022 12:31   Disposition   Pt is being discharged home today in good condition.  Follow-up Plans & Appointments     Discharge Instructions     Diet - low sodium heart healthy   Complete by: As directed    Increase activity slowly   Complete by: As directed         Discharge Medications   Allergies as of 11/23/2022       Reactions   Darvon [propoxyphene] Swelling   Penicillins Swelling        Medication List     TAKE these medications    albuterol 108 (90 Base) MCG/ACT inhaler Commonly known as: VENTOLIN HFA Inhale 2 puffs into the lungs every 6 (six) hours as needed for wheezing or shortness of breath.   aspirin EC 81 MG tablet Take 81 mg by mouth daily. Swallow whole.   calcium carbonate 1500 (600 Ca) MG Tabs tablet Commonly known as: OSCAL Take 1 tablet by mouth daily with breakfast.   Farxiga 10 MG Tabs  tablet Generic drug: dapagliflozin propanediol Take 10 mg by mouth daily.   fluticasone 50 MCG/ACT nasal spray Commonly known as: FLONASE Place 1-2 sprays into both nostrils as needed for allergies or rhinitis.   furosemide 40 MG tablet Commonly known as: LASIX Take 40 mg by mouth daily. What changed: Another medication with the same name was removed. Continue taking this medication, and follow the directions you see here.   gabapentin 100 MG capsule Commonly known as: NEURONTIN Take 100 mg by mouth 2 (two) times daily.   hydrocortisone cream 1 % Apply 1 application  topically daily as needed for itching.   levothyroxine 88 MCG tablet Commonly known as: SYNTHROID Take 88 mcg by mouth daily before breakfast.   loratadine 10 MG tablet Commonly known as: CLARITIN Take 10 mg by mouth daily.   Magnesium 250 MG Tabs Take 250 mg by mouth daily.   OXYGEN Inhale 2 L into the lungs at bedtime.   polymixin-bacitracin 500-10000 UNIT/GM Oint ointment Apply 1 application  topically 2 (two) times daily.   potassium chloride 10 MEQ tablet Commonly known as: KLOR-CON Take 1 tablet (10 mEq total) by mouth daily.   rosuvastatin 10 MG tablet  Commonly known as: CRESTOR Take 1 tablet (10 mg total) by mouth daily.   Trelegy Ellipta 100-62.5-25 MCG/ACT Aepb Generic drug: Fluticasone-Umeclidin-Vilant Inhale 1 puff into the lungs daily.   TYLENOL 8 HOUR ARTHRITIS PAIN PO Take 1-2 tablets by mouth as needed for pain.           Outstanding Labs/Studies   None  Duration of Discharge Encounter   Greater than 30 minutes including physician time.  Signed, Rosaria Ferries, PA-C 11/23/2022, 4:54 PM  EP Attending  Patient seen and examined. Agree with the findings as noted above. See my note as well. She is stable for DC with the usual followup. Her PM interrogation under my direction demonstrates normal DDD PM function.   Carleene Overlie Taylor,MD

## 2022-11-23 NOTE — Progress Notes (Signed)
Nsg Discharge Note  Admit Date:  11/21/2022 Discharge date: 11/23/2022   Kaziya Yzaguirre to be D/C'd Home per MD order.  AVS completed.   Patient/caregiver able to verbalize understanding.  Discharge Medication: Allergies as of 11/23/2022       Reactions   Darvon [propoxyphene] Swelling   Penicillins Swelling        Medication List     TAKE these medications    albuterol 108 (90 Base) MCG/ACT inhaler Commonly known as: VENTOLIN HFA Inhale 2 puffs into the lungs every 6 (six) hours as needed for wheezing or shortness of breath.   aspirin EC 81 MG tablet Take 81 mg by mouth daily. Swallow whole.   calcium carbonate 1500 (600 Ca) MG Tabs tablet Commonly known as: OSCAL Take 1 tablet by mouth daily with breakfast.   Farxiga 10 MG Tabs tablet Generic drug: dapagliflozin propanediol Take 10 mg by mouth daily.   fluticasone 50 MCG/ACT nasal spray Commonly known as: FLONASE Place 1-2 sprays into both nostrils as needed for allergies or rhinitis.   furosemide 40 MG tablet Commonly known as: LASIX Take 40 mg by mouth daily. What changed: Another medication with the same name was removed. Continue taking this medication, and follow the directions you see here.   gabapentin 100 MG capsule Commonly known as: NEURONTIN Take 100 mg by mouth 2 (two) times daily.   hydrocortisone cream 1 % Apply 1 application  topically daily as needed for itching.   levothyroxine 88 MCG tablet Commonly known as: SYNTHROID Take 88 mcg by mouth daily before breakfast.   loratadine 10 MG tablet Commonly known as: CLARITIN Take 10 mg by mouth daily.   Magnesium 250 MG Tabs Take 250 mg by mouth daily.   OXYGEN Inhale 2 L into the lungs at bedtime.   polymixin-bacitracin 500-10000 UNIT/GM Oint ointment Apply 1 application  topically 2 (two) times daily.   potassium chloride 10 MEQ tablet Commonly known as: KLOR-CON Take 1 tablet (10 mEq total) by mouth daily.    rosuvastatin 10 MG tablet Commonly known as: CRESTOR Take 1 tablet (10 mg total) by mouth daily.   Trelegy Ellipta 100-62.5-25 MCG/ACT Aepb Generic drug: Fluticasone-Umeclidin-Vilant Inhale 1 puff into the lungs daily.   TYLENOL 8 HOUR ARTHRITIS PAIN PO Take 1-2 tablets by mouth as needed for pain.        Discharge Assessment: Vitals:   11/23/22 1128 11/23/22 1557  BP: 116/71 139/70  Pulse: 60 64  Resp: (!) 21 20  Temp: 97.7 F (36.5 C) 97.9 F (36.6 C)  SpO2: 94%    Skin clean, dry and intact without evidence of skin break down, no evidence of skin tears noted. IV catheter discontinued intact. Site without signs and symptoms of complications - no redness or edema noted at insertion site, patient denies c/o pain - only slight tenderness at site.  Dressing with slight pressure applied.  D/c Instructions-Education: Discharge instructions given to patient/family with verbalized understanding. D/c education completed with patient/family including follow up instructions, medication list, d/c activities limitations if indicated, with other d/c instructions as indicated by MD - patient able to verbalize understanding, all questions fully answered. Patient instructed to return to ED, call 911, or call MD for any changes in condition.  Patient escorted via [place], and D/C home via private auto.  Donnice Nielsen, Jolene Schimke, RN 11/23/2022 5:12 PM

## 2022-11-25 ENCOUNTER — Encounter (HOSPITAL_COMMUNITY): Payer: Self-pay | Admitting: Cardiovascular Disease

## 2022-12-05 ENCOUNTER — Ambulatory Visit: Payer: Medicare HMO | Attending: Cardiovascular Disease

## 2022-12-05 DIAGNOSIS — I442 Atrioventricular block, complete: Secondary | ICD-10-CM

## 2022-12-05 LAB — CUP PACEART INCLINIC DEVICE CHECK
Battery Remaining Longevity: 124 mo
Battery Voltage: 3.21 V
Brady Statistic AP VP Percent: 38.23 %
Brady Statistic AP VS Percent: 0 %
Brady Statistic AS VP Percent: 61.7 %
Brady Statistic AS VS Percent: 0.07 %
Brady Statistic RA Percent Paced: 38.23 %
Brady Statistic RV Percent Paced: 99.93 %
Date Time Interrogation Session: 20240307165645
Implantable Lead Connection Status: 753985
Implantable Lead Connection Status: 753985
Implantable Lead Implant Date: 20240223
Implantable Lead Implant Date: 20240223
Implantable Lead Location: 753859
Implantable Lead Location: 753860
Implantable Lead Model: 3830
Implantable Lead Model: 5076
Implantable Pulse Generator Implant Date: 20240223
Lead Channel Impedance Value: 418 Ohm
Lead Channel Impedance Value: 418 Ohm
Lead Channel Impedance Value: 570 Ohm
Lead Channel Impedance Value: 589 Ohm
Lead Channel Pacing Threshold Amplitude: 1 V
Lead Channel Pacing Threshold Amplitude: 1 V
Lead Channel Pacing Threshold Amplitude: 1.25 V
Lead Channel Pacing Threshold Pulse Width: 0.4 ms
Lead Channel Pacing Threshold Pulse Width: 0.4 ms
Lead Channel Pacing Threshold Pulse Width: 0.4 ms
Lead Channel Sensing Intrinsic Amplitude: 22 mV
Lead Channel Sensing Intrinsic Amplitude: 3.5 mV
Lead Channel Sensing Intrinsic Amplitude: 4.125 mV
Lead Channel Setting Pacing Amplitude: 3.5 V
Lead Channel Setting Pacing Amplitude: 3.75 V
Lead Channel Setting Pacing Pulse Width: 0.4 ms
Lead Channel Setting Sensing Sensitivity: 1.2 mV
Zone Setting Status: 755011
Zone Setting Status: 755011

## 2022-12-05 NOTE — Patient Instructions (Signed)
   After Your Pacemaker   Monitor your pacemaker site for redness, swelling, and drainage. Call the device clinic at [phone] if you experience these symptoms or fever/chills.  Your incision was closed with Steri-strips or staples:  You may shower 7 days after your procedure and wash your incision with soap and water. Avoid lotions, ointments, or perfumes over your incision until it is well-healed.  You may use a hot tub or a pool after your wound check appointment if the incision is completely closed.  Do not lift, push or pull greater than 10 pounds with the affected arm until 6 weeks after your procedure. UNTIL AFTER APRIL 6TH. There are no other restrictions in arm movement after your wound check appointment.  You may drive, unless driving has been restricted by your healthcare providers.  Remote monitoring is used to monitor your pacemaker from home. This monitoring is scheduled every 91 days by our office. It allows [place] to keep an eye on the functioning of your device to ensure it is working properly. You will routinely see your Electrophysiologist annually (more often if necessary).

## 2022-12-05 NOTE — Progress Notes (Signed)
Wound check appointment. Steri-strips removed. Wound without redness or edema. Incision edges approximated, wound well healed. Normal device function. Thresholds, sensing, and impedances consistent with implant measurements. Device programmed at 3.5V/auto capture programmed on for extra safety margin until 3 month visit. Histogram distribution appropriate for patient and level of activity. No mode switches or high ventricular rates noted. Patient educated about wound care, arm mobility, lifting restrictions. ROV in 3 months with implanting physician.  Patient reports she was given a prescription for polysporin when she left the hospital but has no idea why.  There are no open or wound areas of concern where this would be used.  I reviewed with Dr. Myles Gip who is also unsure and states that patient does not need to use it.  Patient verbalizes understanding and I have removed from r/x list.

## 2022-12-18 ENCOUNTER — Ambulatory Visit: Payer: Medicare Other | Admitting: Oncology

## 2022-12-24 IMAGING — US US EXTREM  UP VENOUS*L*
1 series · 13 of 24 positions shown · non-contrast
Comparison: None Available.

CLINICAL DATA: Left upper extremity pain and edema. Evaluate for
DVT.



[Series 1: us extrem up venous*left* · 0.05mm/px · 13 of 36 slices shown]
[im 1/36]
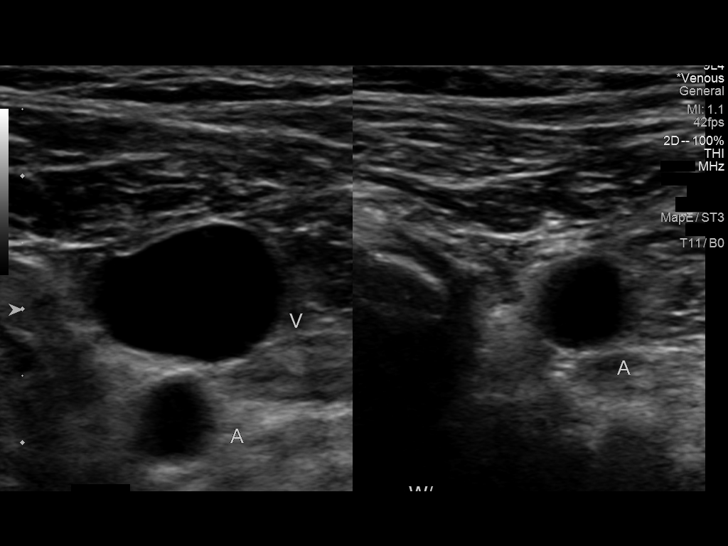
[im 4/36]
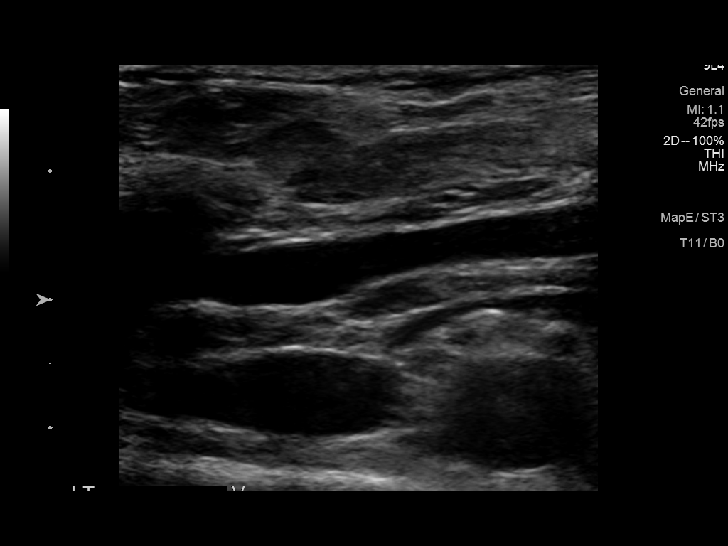
[im 7/36]
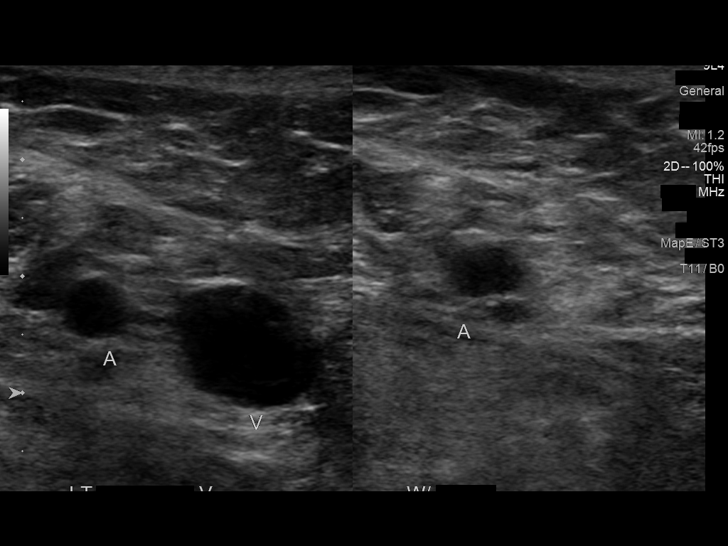
[im 10/36]
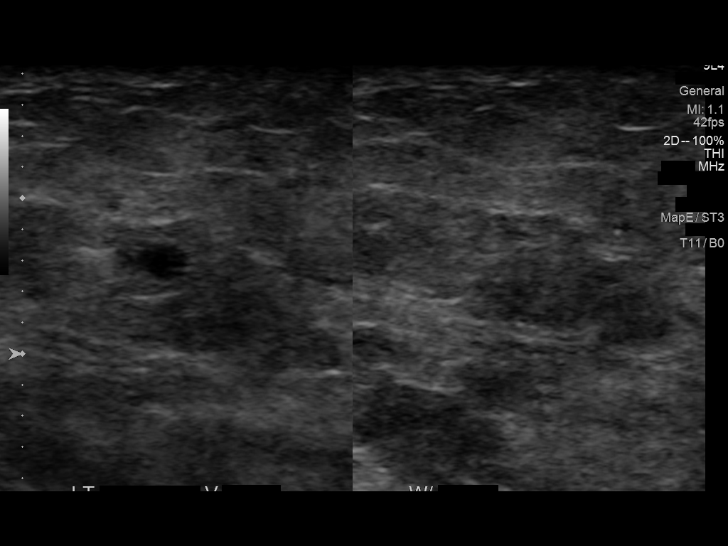
[im 13/36]
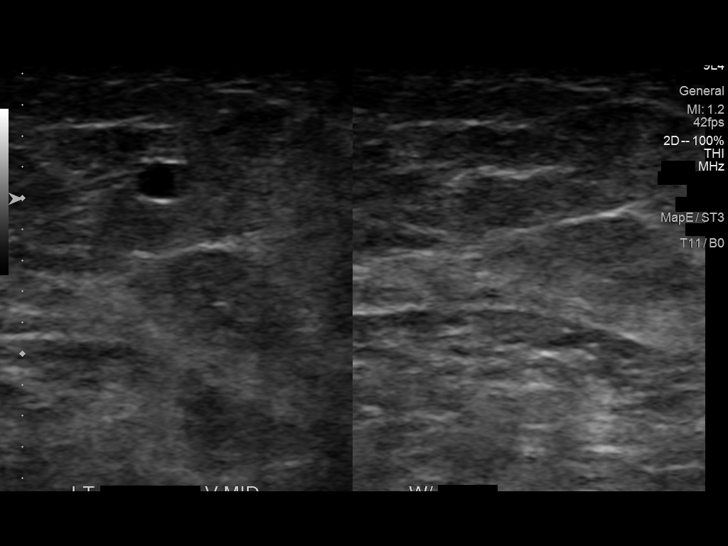
[im 16/36]
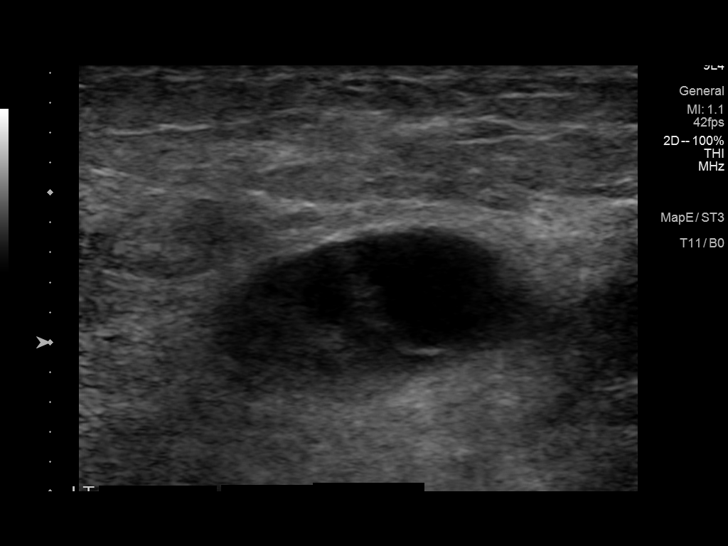
[im 19/36]
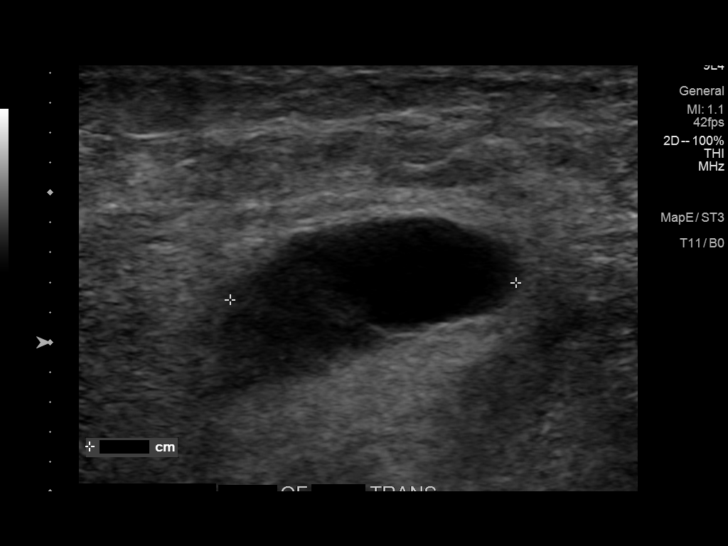
[im 20/36]
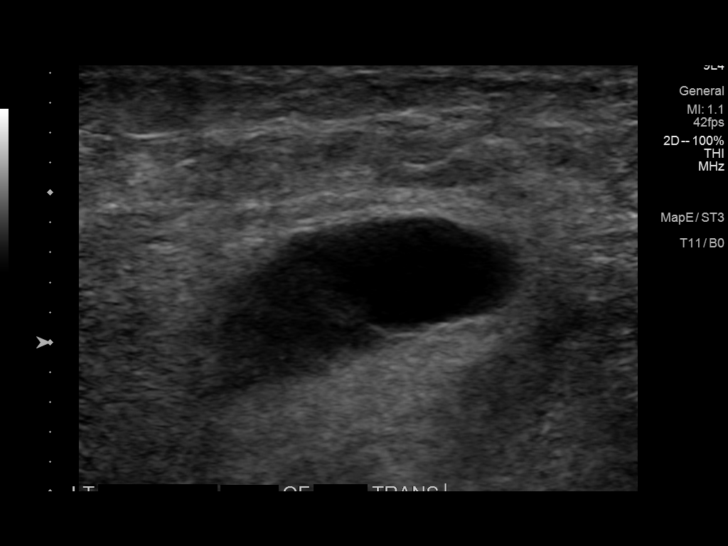
[im 23/36]
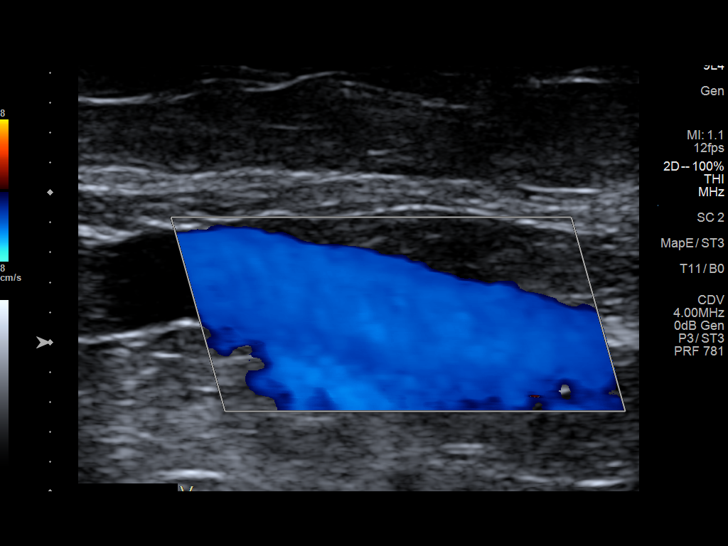
[im 26/36]
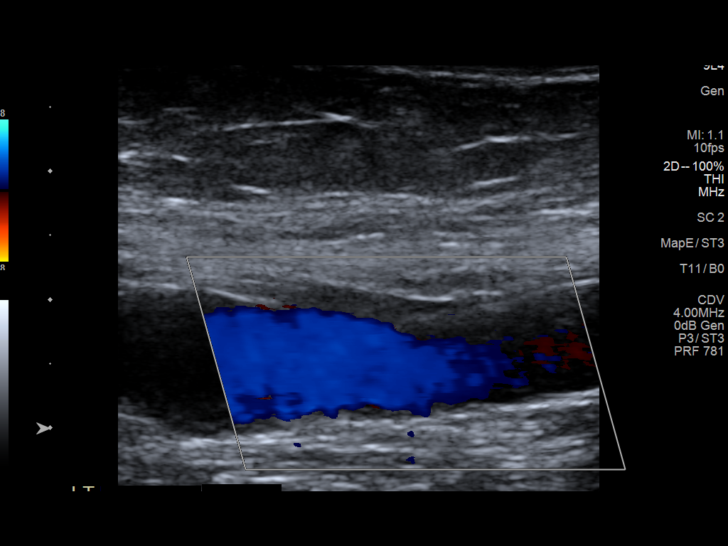
[im 29/36]
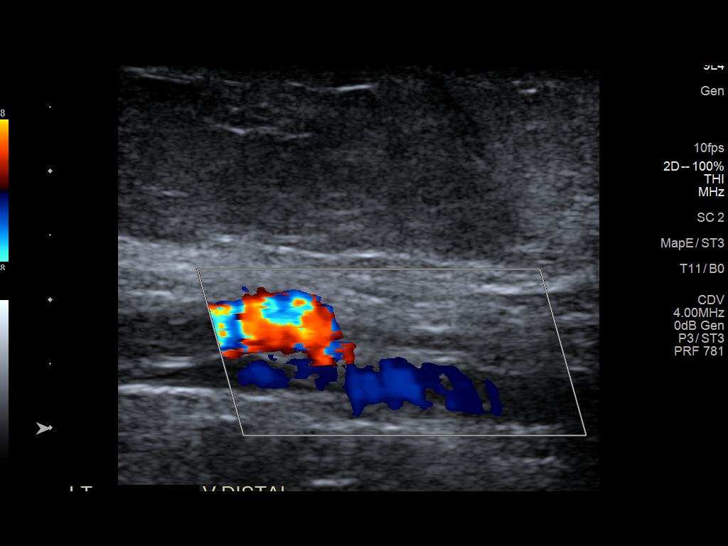
[im 32/36]
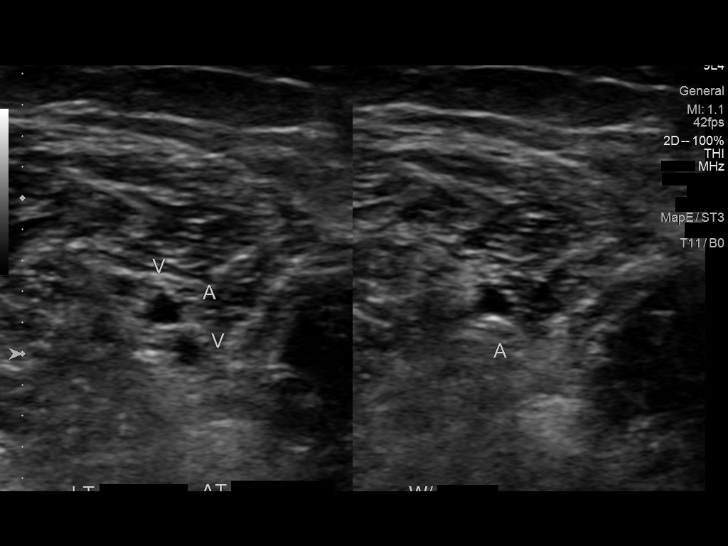
[im 36/36]
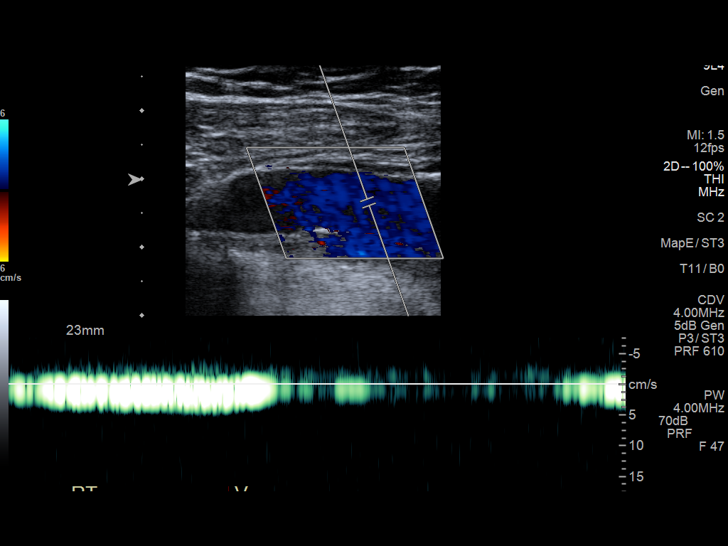

[13 of 24 positions shown; findings below may reference images not displayed]

FINDINGS: Contralateral Subclavian Vein: Respiratory phasicity is normal and
symmetric with the symptomatic side. No evidence of thrombus. Normal
compressibility.

Internal Jugular Vein: No evidence of thrombus. Normal
compressibility, respiratory phasicity and response to augmentation.

Subclavian Vein: No evidence of thrombus. Normal compressibility,
respiratory phasicity and response to augmentation.

Axillary Vein: No evidence of thrombus. Normal compressibility,
respiratory phasicity and response to augmentation.

Cephalic Vein: No evidence of thrombus. Normal compressibility,
respiratory phasicity and response to augmentation.

Basilic Vein: No evidence of thrombus. Normal compressibility,
respiratory phasicity and response to augmentation.

Brachial Veins: No evidence of thrombus. Normal compressibility,
respiratory phasicity and response to augmentation.

Radial Veins: No evidence of thrombus. Normal compressibility,
respiratory phasicity and response to augmentation.

Ulnar Veins: No evidence of thrombus. Normal compressibility,
respiratory phasicity and response to augmentation.

Venous Reflux:  None visualized.

Other Findings: Sonographic evaluation of patient's palpable area of
concern involving the superior aspect left upper arm correlates with
a approximately 2.4 x 0.9 x 1.9 cm minimally complex subcutaneous
apparent fluid collection (images 17 through 21). This collection
does not appear to communicate with the overlying dermal surface or
any adjacent vascular structure.
IMPRESSION: 1. No evidence of DVT within the left upper extremity.
2. Sonographic evaluation of patient's palpable area of concern
involving the superior aspect the left upper arm correlates with an
approximately 2.4 cm minimally complex fluid collection,
indeterminate with broad differential considerations including
hematoma, seroma and abscess. Clinical correlation is advised.

## 2023-01-03 LAB — HM MAMMOGRAPHY

## 2023-01-08 NOTE — Progress Notes (Signed)
[facility]  [address] [place],  [place]  [address] [phone]  Clinic Day: 01/09/23  Referring physician: Abner Greenspan, MD  CHIEF COMPLAINT:  CC:   Ductal carcinoma in situ of the breast  Current Treatment:   Observation   HISTORY OF PRESENT ILLNESS:  [name] [name] is a 82 y.o. female with a history of stage 0 (TIS N0 M0) hormone receptor positive right breast cancer diagnosed in July 2015. Pathology revealed a 3.5 cm, grade 2, ductal carcinoma in situ.  She was treated with right mastectomy.  She was placed on chemoprevention with tamoxifen 20 mg daily, but discontinued this on her own.  When we saw her in February 2016, we recommended that the patient resume the tamoxifen.  We also recommended follow-up in 3 months, however, the patient did not schedule follow up until January of 2017 due to other medical problems and financial reasons. She stated she was taking tamoxifen daily at that time and remained without evidence of disease.  Bone density scan in October 2018 revealed a 6.1% improvement in her osteopenia with a T-score of -1.7 in the femur.  Screening mammogram in January 2019 revealed a possible mass in the left breast.  Diagnostic mammogram revealed a focally dilated duct in the subareolar area accounting for the finding on screening mammogram.  This was confirmed on ultrasound.  There was no evidence of malignancy  Colonoscopy in June 2011 revealed multiple polyps which were removed, including 4 tubular adenomas and 2 tubulovillous adenomas.   She did not go for follow-up colonoscopy after that, so we referred her back to Dr. Jennye Boroughs. Colonoscopy in March 2017 revealeda tubular adenoma and so he recommended a repeat in 1 year.  Due to her personal and family history of malignancy and colon polyps, we recommended testing for hereditary cancer syndromes with the Myriad myRisk Hereditary Cancer Panel test.  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  Due to this finding, colonoscopy at least every 5 years is recommended.  The patient had repeat colonoscopy in January 2020 and had removal of tubular adenomas once again.  She had a left shoulder replacement in September 2020 due to pain and humerus fracture. She has had bilateral knee surgeries. Bone density from October 2020 revealed worsening osteopenia with a T-score of -2.1 of the left femur neck, previously -1.7.  The left forearm radius was still within the normal range with a T-score of 0.8, previously 0.5.  She is taking calcium/vitamin D. She had a large soft mass of the right buttock excised by Dr. Lequita Halt.  Pathology revealed a soft tissue lipoma.  She also had a punch biopsy of a left shoulder lesion which revealed a junctional dysplastic melanocytic nevus with moderate to severe atypia.  This lesion was excised in October of 2021.  Residual dysplastic nevus was seen but the margins were free of involvement.  She was hospitalized in December of 2022, and was told she has congestive heart failure. Chest xray does show cardiomegaly. Repeat Echocardiogram in December of 2022 revealed normal left ventricular systolic function with an EF of 60-65%, but she does have moderate aortic stenosis and moderate mitral stenosis.   INTERVAL HISTORY:   [name] [name] is here for follow up for history of stage 0 (TIS N0 M0) hormone receptor positive right breast cancer. Patient states that she feels well and complains of a head cold, hoarse voice  from allergies, and a sore knot on her butt. She does have a history of CHF and sees Dr. Dulce Sellar. She informed me that she had a pacemaker put in 6 weeks ago as her pulse kept dropping to the point that she was admitted to the ER. Her screening mammogram on 01/03/2023 was clear. She had her last colonoscopy in June, 2023 and it was free of any polyps. Dr. Jennye Boroughs recommended a repeat in  2 years due to her Lynch syndrome.  I will see her back in 1 year with annual screening left mammogram. She denies signs of infection such as sore throat, sinus drainage, cough, or urinary symptoms.  She denies fevers or recurrent chills. She denies pain. She denies nausea, vomiting, chest pain, dyspnea or cough. Her appetite is good and her weight has been stable.  REVIEW OF SYSTEMS:  Review of Systems  Constitutional: Negative.  Negative for appetite change, chills, diaphoresis, fatigue, fever and unexpected weight change.  HENT:   Positive for voice change (allergies). Negative for hearing loss, lump/mass, mouth sores, nosebleeds, sore throat, tinnitus and trouble swallowing.   Eyes:  Positive for eye problems (watery eyes). Negative for icterus.  Respiratory: Negative.  Negative for chest tightness, cough, hemoptysis, shortness of breath and wheezing.   Cardiovascular:  Positive for chest pain (at the site of her newly put pacemaker). Negative for leg swelling and palpitations.  Gastrointestinal: Negative.  Negative for abdominal distention, abdominal pain, blood in stool, constipation, diarrhea, nausea, rectal pain and vomiting.  Endocrine: Negative.  Negative for hot flashes.  Genitourinary: Negative.  Negative for bladder incontinence, difficulty urinating, dyspareunia, dysuria, frequency, hematuria, menstrual problem, nocturia, pelvic pain, vaginal bleeding and vaginal discharge.   Musculoskeletal:  Positive for back pain (Sore "knot" on her butt). Negative for arthralgias, flank pain, gait problem, myalgias, neck pain and neck stiffness.  Skin: Negative.  Negative for itching, rash and wound.  Neurological:  Negative for dizziness, extremity weakness, gait problem, headaches, light-headedness, numbness, seizures and speech difficulty.  Hematological: Negative.  Negative for adenopathy. Does not bruise/bleed easily.  Psychiatric/Behavioral: Negative.  Negative for confusion, decreased  concentration, depression, sleep disturbance and suicidal ideas. The patient is not nervous/anxious.      VITALS:  Blood pressure 120/63, pulse 87, temperature 98.2 F (36.8 C), temperature source Oral, resp. rate 18, height 5\' 4"  (1.626 m), weight 199 lb (90.3 kg), SpO2 95 %.  Wt Readings from Last 3 Encounters:  01/10/23 199 lb (90.3 kg)  01/09/23 199 lb (90.3 kg)  11/21/22 198 lb 10.2 oz (90.1 kg)    Body mass index is 34.16 kg/m.  Performance status (ECOG): 1 - Symptomatic but completely ambulatory  PHYSICAL EXAM:  Physical Exam Vitals and nursing note reviewed.  Constitutional:      General: She is not in acute distress.    Appearance: Normal appearance. She is normal weight. She is not ill-appearing, toxic-appearing or diaphoretic.  HENT:     Head: Normocephalic and atraumatic.     Right Ear: Tympanic membrane, ear canal and external ear normal. There is no impacted cerumen.     Left Ear: Tympanic membrane, ear canal and external ear normal. There is no impacted cerumen.     Nose: Nose normal. No congestion or rhinorrhea.     Mouth/Throat:     Mouth: Mucous membranes are moist.     Pharynx: Oropharynx is clear. No oropharyngeal exudate or posterior oropharyngeal erythema.  Eyes:     General: No scleral icterus.  Right eye: No discharge.        Left eye: No discharge.     Extraocular Movements: Extraocular movements intact.     Conjunctiva/sclera: Conjunctivae normal.     Pupils: Pupils are equal, round, and reactive to light.  Neck:     Vascular: No carotid bruit.  Cardiovascular:     Rate and Rhythm: Normal rate and regular rhythm.     Pulses: Normal pulses.     Heart sounds: Murmur (2/6 systolic murmur) heard.     No friction rub. No gallop.  Pulmonary:     Effort: Pulmonary effort is normal. No respiratory distress.     Breath sounds: Normal breath sounds. No stridor. No wheezing, rhonchi or rales.  Chest:     Chest wall: No tenderness.  Breasts:     Right: Absent. No swelling, bleeding, inverted nipple, mass, nipple discharge, skin change or tenderness.     Left: Normal.     Comments: Right mastectomy site is negative.   Left breast is without masses. She has a pacemaker in the left upper chest wall, which is well healed.   Abdominal:     General: Bowel sounds are normal. There is no distension.     Palpations: Abdomen is soft. There is no hepatomegaly, splenomegaly or mass.     Tenderness: There is no abdominal tenderness. There is no right CVA tenderness, left CVA tenderness, guarding or rebound.     Hernia: No hernia is present.  Musculoskeletal:        General: No swelling, tenderness, deformity or signs of injury. Normal range of motion.     Cervical back: Normal range of motion and neck supple. No rigidity or tenderness.     Right lower leg: No edema.     Left lower leg: No edema.  Lymphadenopathy:     Cervical: No cervical adenopathy.     Right cervical: No superficial, deep or posterior cervical adenopathy.    Left cervical: No superficial, deep or posterior cervical adenopathy.     Upper Body:     Right upper body: No supraclavicular, axillary or pectoral adenopathy.     Left upper body: No supraclavicular, axillary or pectoral adenopathy.     Lower Body: No right inguinal adenopathy. No left inguinal adenopathy.  Skin:    General: Skin is warm and dry.     Coloration: Skin is not jaundiced or pale.     Findings: No bruising, erythema, lesion or rash.  Neurological:     General: No focal deficit present.     Mental Status: She is alert and oriented to person, place, and time. Mental status is at baseline.     Cranial Nerves: No cranial nerve deficit.     Sensory: No sensory deficit.     Motor: No weakness.     Coordination: Coordination normal.     Gait: Gait normal.     Deep Tendon Reflexes: Reflexes normal.  Psychiatric:        Mood and Affect: Mood normal.        Behavior: Behavior normal.        Thought  Content: Thought content normal.        Judgment: Judgment normal.    LABS:      Latest Ref Rng & Units 11/21/2022   11:48 AM 08/02/2010    4:00 AM 08/01/2010    4:15 AM  CBC  WBC 4.0 - 10.5 K/uL 4.1  9.6  7.4   Hemoglobin 12.0 -  15.0 g/dL 47.8  29.5  62.1   Hematocrit 36.0 - 46.0 % 47.2  31.3  34.4   Platelets 150 - 400 K/uL 103  152  165       Latest Ref Rng & Units 11/22/2022   12:13 AM 11/21/2022   11:48 AM 08/02/2010    4:00 AM  CMP  Glucose 70 - 99 mg/dL 308  657  846   BUN 8 - 23 mg/dL 22  21  7    Creatinine 0.44 - 1.00 mg/dL 9.62  9.52  8.41   Sodium 135 - 145 mmol/L 137  140  135   Potassium 3.5 - 5.1 mmol/L 3.9  3.9  4.1   Chloride 98 - 111 mmol/L 105  106  100   CO2 22 - 32 mmol/L 23  25  30    Calcium 8.9 - 10.3 mg/dL 8.9  9.1  7.8    Component Ref Range & Units 11/21/2022  Magnesium 1.7 - 2.4 mg/dL 2.6 High      Component Ref Range & Units 11/21/2022  Hgb A1c MFr Bld 4.8 - 5.6 % 5.9 High   Mean Plasma Glucose mg/dL 324.40   Component Ref Range & Units 11/21/2022 12 yr ago 13 yr ago  Prothrombin Time 11.4 - 15.2 seconds 13.3 13.3 R 13.2 R  INR 0.8 - 1.2 1.0 0.99 R 1.01 R   Component Ref Range & Units 11/21/2022  Troponin I (High Sensitivity) <18 ng/L 25 High     STUDIES:  Exam: 01/03/23 Digital Screening Unilateral Left Mammogram with CAD and Tomosynthesis Impression: No mammographic evidence of malignancy.   Exam: Colonoscopy Impression: Diverticular disease mi left colon Tattoo from prior propped polypectomy unremarkable.       HISTORY:   Past Medical History:  Diagnosis Date   Aortic valve stenosis, moderate    Arthritis    Asthma    Benign hypertension with CKD (chronic kidney disease) stage III (HCC)    COPD (chronic obstructive pulmonary disease) (HCC)    Diabetic peripheral neuropathy (HCC)    Diastolic congestive heart failure (HCC)    DM type 2 with diabetic mixed hyperlipidemia (HCC)    Hearing loss    History of  breast cancer    HLD (hyperlipidemia)    HTN (hypertension)    Hypothyroid    Intraductal carcinoma in situ of right breast 04/27/2014   Mitral valve stenosis    Morbid obesity (HCC)    Muscle pain    Osteopenia after menopause 07/11/2017   Polyposis associated with heterozygous mutation in MUTYH gene 01/03/2022   Sinus problem    Swelling    Tubular adenoma of colon 01/03/2022   Multiple, MUTYH mutation    Past Surgical History:  Procedure Laterality Date   BACK SURGERY  02/14/2014   MASTECTOMY Right 2015   PACEMAKER IMPLANT N/A 11/22/2022   Procedure: PACEMAKER IMPLANT;  Surgeon: Maurice Small, MD;  Location: [facility];  Service: Cardiovascular;  Laterality: N/A;   PARTIAL HYSTERECTOMY  1980   TONSILLECTOMY     TOTAL KNEE ARTHROPLASTY Right 02/07/2011    Family History  Problem Relation Age of Onset   Diabetes Mellitus I Mother    Heart disease Mother    Breast cancer Sister    Alzheimer's disease Other     Social History:  reports that she has never smoked. She has never been exposed to tobacco smoke. She has never used smokeless tobacco. She reports current alcohol use. She reports  that she does not use drugs.The patient is alone today.  Allergies:  Allergies  Allergen Reactions   Darvon [Propoxyphene] Swelling   Penicillins Swelling    Current Medications: Current Outpatient Medications  Medication Sig Dispense Refill   Acetaminophen (TYLENOL 8 HOUR ARTHRITIS PAIN PO) Take 1-2 tablets by mouth as needed for pain.     albuterol (VENTOLIN HFA) 108 (90 Base) MCG/ACT inhaler Inhale 2 puffs into the lungs every 6 (six) hours as needed for wheezing or shortness of breath.     aspirin 81 MG EC tablet Take 81 mg by mouth daily. Swallow whole.     calcium carbonate (OSCAL) 1500 (600 Ca) MG TABS tablet Take 1 tablet by mouth daily with breakfast.     FARXIGA 10 MG TABS tablet Take 10 mg by mouth daily.     fluticasone (FLONASE) 50 MCG/ACT nasal spray Place 1-2  sprays into both nostrils as needed for allergies or rhinitis.     Fluticasone-Umeclidin-Vilant (TRELEGY ELLIPTA) 100-62.5-25 MCG/ACT AEPB Inhale 1 puff into the lungs daily.     furosemide (LASIX) 40 MG tablet Take 40 mg by mouth daily.     gabapentin (NEURONTIN) 100 MG capsule Take 100 mg by mouth 2 (two) times daily.     hydrocortisone cream 1 % Apply 1 application  topically daily as needed for itching.     levothyroxine (SYNTHROID) 88 MCG tablet Take 88 mcg by mouth daily before breakfast.     loratadine (CLARITIN) 10 MG tablet Take 10 mg by mouth daily.     Magnesium 250 MG TABS Take 250 mg by mouth daily.     OXYGEN Inhale 2 L into the lungs at bedtime.     potassium chloride (KLOR-CON) 10 MEQ tablet Take 1 tablet (10 mEq total) by mouth daily. 90 tablet 2   rosuvastatin (CRESTOR) 10 MG tablet Take 1 tablet (10 mg total) by mouth daily. 90 tablet 3   No current facility-administered medications for this visit.     ASSESSMENT & PLAN:  Assessment: 1. History of hormone receptor positive ductal carcinoma in situ treated with mastectomy and chemoprevention with tamoxifen for 5 years.  The patient remains without evidence of recurrence.  2. Monoallelic mutation in the MUTYH gene, which is associated with an increased risk of polyps and colon cancer.  She has had multiple adenomatous polyps. Fortunately her last colonoscopy June 2023 was clear.   3. Osteopenia, with improvement of the left femur on last year's bone density scan.    Plan:     She does have a history of CHF and sees Dr. Dulce Sellar. She informed me that she had a pacemaker put in 6 weeks ago as her pulse kept dropping to the point that she was admitted to the ER. Her screening mammogram on 01/03/2023 was clear. Her last colonoscopy in June, 2023 was clear and Dr. Jennye Boroughs recommends a repeat in 2 years due to her MUTYH associated polyposis. I will see her back in 1 year with annual screening unilateral left mammogram. The  patient understands the plans discussed today and is in agreement with them.  She knows to contact our office if she develops concerns prior to her next appointment.  I provided 15 minutes of face-to-face time during this this encounter and > 50% was spent counseling as documented under my assessment and plan.    Dellia Beckwith, MD     I,Jasmine M Lassiter,acting as a scribe for Dellia Beckwith, MD.,have documented all relevant documentation  on the behalf of Dellia Beckwith, MD,as directed by  Dellia Beckwith, MD while in the presence of Dellia Beckwith, MD.

## 2023-01-09 ENCOUNTER — Other Ambulatory Visit: Payer: Self-pay | Admitting: Oncology

## 2023-01-09 ENCOUNTER — Inpatient Hospital Stay: Payer: Medicare HMO | Attending: Oncology | Admitting: Oncology

## 2023-01-09 VITALS — BP 120/63 | HR 87 | Temp 98.2°F | Resp 18 | Ht 64.0 in | Wt 199.0 lb

## 2023-01-09 DIAGNOSIS — D126 Benign neoplasm of colon, unspecified: Secondary | ICD-10-CM

## 2023-01-09 DIAGNOSIS — D0511 Intraductal carcinoma in situ of right breast: Secondary | ICD-10-CM

## 2023-01-09 NOTE — Progress Notes (Signed)
Cardiology Office Note:    Date:  01/10/2023   ID:  [name], [place] [birth year], MRN [id number]  PCP:  Abner Greenspan, MD  Cardiologist:  Norman Herrlich, MD    Referring MD: Abner Greenspan, MD    ASSESSMENT:    1. Aortic valve stenosis, moderate   2. Nonrheumatic mitral valve stenosis   3. Heart block AV complete   4. Pacemaker   5. Hypertensive heart disease with heart failure    PLAN:    In order of problems listed above:  Her aortic stenosis is not critical I suspect gradient was exaggerated in heart block high cardiac output hyperdynamic ventricle will recheck an echocardiogram in 6 months and follow closely for symptoms for her aortic and mitral valve disease Proved following her pacemaker Stable no fluid overload continue her loop diuretic antihypertensive agents   Next appointment: 6 months   Medication Adjustments/Labs and Tests Ordered: Current medicines are reviewed at length with the patient today.  Concerns regarding medicines are outlined above.  Orders Placed This Encounter  Procedures   ECHOCARDIOGRAM COMPLETE   No orders of the defined types were placed in this encounter.   Chief Complaint  Patient presents with   Follow-up   Aortic Stenosis   Mitral Valve Prolapse    History of Present Illness:    [name] [name] is a 82 y.o. female with a hx of aortic stenosis and calcific mitral stenosis hypertensive heart disease with heart failure stage III CKD and COPD last seen 04/30/2022.  Most recent echocardiogram performed with heart block 11/22/2022 showed mild aortic stenosis mean gradient 5 mmHg mild mitral regurgitation aortic stenosis defined as moderate to severe with a mean gradient 33 mmHg.  She had a recent permanent pacemaker inserted [facility] 11/22/2022.  I reviewed the echocardiogram and the stroke-volume index is normal I would interpret this as moderate stenosis and in fact the left ventricle is quite  hyperdynamic echocardiogram was done during 2 1 heart block aortic valve does not appear severely restricted short axis and we may be overestimating the degree of aortic stenosis.  Compliance with diet, lifestyle and medications: Yes  She had preceding bifascicular heart block She feels improved is able to engage in activities painting her home heavy housework No exercise intolerance edema shortness of breath chest pain palpitation or syncope  Past Medical History:  Diagnosis Date   Aortic valve stenosis, moderate    Arthritis    Asthma    Benign hypertension with CKD (chronic kidney disease) stage III    COPD (chronic obstructive pulmonary disease)    Diabetic peripheral neuropathy    Diastolic congestive heart failure    DM type 2 with diabetic mixed hyperlipidemia    Hearing loss    History of breast cancer    HLD (hyperlipidemia)    HTN (hypertension)    Hypothyroid    Intraductal carcinoma in situ of right breast 04/27/2014   Mitral valve stenosis    Morbid obesity    Muscle pain    Osteopenia after menopause 07/11/2017   Polyposis associated with heterozygous mutation in MUTYH gene 01/03/2022   Sinus problem    Swelling    Tubular adenoma of colon 01/03/2022   Multiple, MUTYH mutation    Past Surgical History:  Procedure Laterality Date   BACK SURGERY  02/14/2014   MASTECTOMY Right 2015   PACEMAKER IMPLANT N/A 11/22/2022   Procedure: PACEMAKER IMPLANT;  Surgeon: Maurice Small, MD;  [facility]  INVASIVE CV LAB;  Service: Cardiovascular;  Laterality: N/A;   PARTIAL HYSTERECTOMY  1980   TONSILLECTOMY     TOTAL KNEE ARTHROPLASTY Right 02/07/2011    Current Medications: Current Meds  Medication Sig   Acetaminophen (TYLENOL 8 HOUR ARTHRITIS PAIN PO) Take 1-2 tablets by mouth as needed for pain.   albuterol (VENTOLIN HFA) 108 (90 Base) MCG/ACT inhaler Inhale 2 puffs into the lungs every 6 (six) hours as needed for wheezing or shortness of breath.   aspirin 81 MG EC  tablet Take 81 mg by mouth daily. Swallow whole.   calcium carbonate (OSCAL) 1500 (600 Ca) MG TABS tablet Take 1 tablet by mouth daily with breakfast.   FARXIGA 10 MG TABS tablet Take 10 mg by mouth daily.   fluticasone (FLONASE) 50 MCG/ACT nasal spray Place 1-2 sprays into both nostrils as needed for allergies or rhinitis.   Fluticasone-Umeclidin-Vilant (TRELEGY ELLIPTA) 100-62.5-25 MCG/ACT AEPB Inhale 1 puff into the lungs daily.   furosemide (LASIX) 40 MG tablet Take 40 mg by mouth daily.   gabapentin (NEURONTIN) 100 MG capsule Take 100 mg by mouth 2 (two) times daily.   hydrocortisone cream 1 % Apply 1 application  topically daily as needed for itching.   levothyroxine (SYNTHROID) 88 MCG tablet Take 88 mcg by mouth daily before breakfast.   loratadine (CLARITIN) 10 MG tablet Take 10 mg by mouth daily.   Magnesium 250 MG TABS Take 250 mg by mouth daily.   OXYGEN Inhale 2 L into the lungs at bedtime.   potassium chloride (KLOR-CON) 10 MEQ tablet Take 1 tablet (10 mEq total) by mouth daily.   [DISCONTINUED] rosuvastatin (CRESTOR) 10 MG tablet Take 1 tablet (10 mg total) by mouth daily.     Allergies:   Darvon [propoxyphene] and Penicillins   Social History   Socioeconomic History   Marital status: Widowed    Spouse name: Not on file   Number of children: 3   Years of education: Not on file   Highest education level: Not on file  Occupational History   Occupation: bakery    Employer: [employer]  Tobacco Use   Smoking status: Never    Passive exposure: Never   Smokeless tobacco: Never  Vaping Use   Vaping Use: Never used  Substance and Sexual Activity   Alcohol use: Yes    Comment: Minimal amount   Drug use: No   Sexual activity: Not on file  Other Topics Concern   Not on file  Social History Narrative   Not on file   Social Determinants of Health   Financial Resource Strain: Not on file  Food Insecurity: No Food Insecurity (11/23/2022)   Hunger Vital Sign    Worried  About Running Out of Food in the Last Year: Never true    Ran Out of Food in the Last Year: Never true  Transportation Needs: No Transportation Needs (11/23/2022)   PRAPARE - Administrator, Civil Service (Medical): No    Lack of Transportation (Non-Medical): No  Physical Activity: Not on file  Stress: Not on file  Social Connections: Not on file     Family History: The patient's family history includes Alzheimer's disease in an other family member; Breast cancer in her sister; Diabetes Mellitus I in her mother; Heart disease in her mother. ROS:   Please see the history of present illness.    All other systems reviewed and are negative.  EKGs/Labs/Other Studies Reviewed:    The following studies  were reviewed today:  Cardiac Studies & Procedures       ECHOCARDIOGRAM  ECHOCARDIOGRAM COMPLETE 11/21/2022  Narrative ECHOCARDIOGRAM REPORT    Patient Name:   Ladean Steinmeyer Date of Exam: 11/21/2022 Medical Rec #:  [id number]                    Height:       63.5 in Accession #:    [id number]                   Weight:       199.0 lb Date of Birth:  1941/02/22                   BSA:          1.941 m Patient Age:    81 years                     BP:           143/69 mmHg Patient Gender: F                            HR:           34 bpm. Exam Location:  Inpatient  Procedure: 2D Echo, Cardiac Doppler and Color Doppler  Indications:    Bradycardia  History:        Patient has prior history of Echocardiogram examinations. CHF, COPD, Aortic Valve Disease and Mitral Valve Disease; Risk Factors:Diabetes, Hypertension and Dyslipidemia.  Sonographer:    Meagan Baucom RDCS, FE, PE Referring Phys: RENEE LYNN URSUY  IMPRESSIONS   1. Left ventricular ejection fraction, by estimation, is 65%. The left ventricle has normal function. The left ventricle has no regional wall motion abnormalities. There is severe left ventricular hypertrophy. Left ventricular diastolic  parameters are indeterminate. 2. Right ventricular systolic function is normal. The right ventricular size is normal. There is moderately elevated pulmonary artery systolic pressure. The estimated right ventricular systolic pressure is 47.9 mmHg. 3. Left atrial size was severely dilated. 4. Right atrial size was mildly dilated. 5. The mitral valve is degenerative. Mild systolic and diastolic mitral valve regurgitation. Mild to moderate mitral stenosis. The mean mitral valve gradient is 5.0 mmHg with average heart rate of 35 bpm. Severe, exuberant mitral annular calcification. 6. Tricuspid valve regurgitation is mild to moderate. 7. The aortic valve is abnormal. There is severe calcifcation of the aortic valve. Aortic valve regurgitation is trivial. Moderate to severe aortic valve stenosis. Aortic valve mean gradient measures 33.0 mmHg. Aortic valve Vmax measures 3.60 m/s. Valve area and DVI calculation impacted by high LVOT TVI. 8. The inferior vena cava is dilated in size with >50% respiratory variability, suggesting right atrial pressure of 8 mmHg. 9. Trivial pericardial effusion.  FINDINGS Left Ventricle: Left ventricular ejection fraction, by estimation, is 65%. The left ventricle has normal function. The left ventricle has no regional wall motion abnormalities. The left ventricular internal cavity size was normal in size. There is severe left ventricular hypertrophy. Left ventricular diastolic function could not be evaluated due to mitral annular calcification (moderate or greater). Left ventricular diastolic parameters are indeterminate.  Right Ventricle: The right ventricular size is normal. No increase in right ventricular wall thickness. Right ventricular systolic function is normal. There is moderately elevated pulmonary artery systolic pressure. The tricuspid regurgitant velocity is 3.16 m/s, and with an assumed right atrial pressure of 8  mmHg, the estimated right ventricular systolic  pressure is 47.9 mmHg.  Left Atrium: Left atrial size was severely dilated.  Right Atrium: Right atrial size was mildly dilated.  Pericardium: There is no evidence of pericardial effusion.  Mitral Valve: The mitral valve is degenerative in appearance. Severe mitral annular calcification. Mild systolic and diastolic mitral valve regurgitation. Mild to moderate mitral valve stenosis. MV peak gradient, 16.2 mmHg. The mean mitral valve gradient is 5.0 mmHg with average heart rate of 35 bpm.  Tricuspid Valve: The tricuspid valve is normal in structure. Tricuspid valve regurgitation is mild to moderate. No evidence of tricuspid stenosis.  Aortic Valve: The aortic valve is abnormal. There is severe calcifcation of the aortic valve. Aortic valve regurgitation is trivial. Moderate to severe aortic stenosis is present. Aortic valve mean gradient measures 33.0 mmHg. Aortic valve peak gradient measures 51.8 mmHg. Aortic valve area, by VTI measures 0.90 cm.  Pulmonic Valve: The pulmonic valve was not well visualized. Pulmonic valve regurgitation is trivial. No evidence of pulmonic stenosis.  Aorta: The aortic root is normal in size and structure.  Venous: The inferior vena cava is dilated in size with greater than 50% respiratory variability, suggesting right atrial pressure of 8 mmHg.  IAS/Shunts: The atrial septum is grossly normal.   LEFT VENTRICLE PLAX 2D LVIDd:         3.50 cm   Diastology LVIDs:         2.10 cm   LV e' medial:    7.07 cm/s LV PW:         1.30 cm   LV E/e' medial:  35.6 LV IVS:        1.50 cm   LV e' lateral:   4.79 cm/s LVOT diam:     1.60 cm   LV E/e' lateral: 52.6 LV SV:         103 LV SV Index:   53 LVOT Area:     2.01 cm   RIGHT VENTRICLE RV S prime:     10.00 cm/s TAPSE (M-mode): 1.6 cm  LEFT ATRIUM              Index        RIGHT ATRIUM           Index LA diam:        5.00 cm  2.58 cm/m   RA Area:     20.50 cm LA Vol (A2C):   178.0 ml 91.71 ml/m  RA  Volume:   61.60 ml  31.74 ml/m LA Vol (A4C):   167.0 ml 86.04 ml/m LA Biplane Vol: 173.0 ml 89.13 ml/m AORTIC VALVE AV Area (Vmax):    0.92 cm AV Area (Vmean):   0.90 cm AV Area (VTI):     0.90 cm AV Vmax:           360.00 cm/s AV Vmean:          260.000 cm/s AV VTI:            1.140 m AV Peak Grad:      51.8 mmHg AV Mean Grad:      33.0 mmHg LVOT Vmax:         164.00 cm/s LVOT Vmean:        117.000 cm/s LVOT VTI:          0.511 m LVOT/AV VTI ratio: 0.45  AORTA Ao Root diam: 2.90 cm  MITRAL VALVE  TRICUSPID VALVE MV Area (PHT): 2.75 cm    TV Peak grad:   24.0 mmHg MV Peak grad:  16.2 mmHg   TV Vmax:        2.45 m/s MV Mean grad:  5.0 mmHg    TR Peak grad:   39.9 mmHg MV Vmax:       2.01 m/s    TR Vmax:        316.00 cm/s MV Vmean:      101.0 cm/s MV Decel Time: 276 msec    SHUNTS Systemic VTI:  0.51 m Systemic Diam: 1.60 cm  Weston BrassGayatri Acharya MD Electronically signed by Weston BrassGayatri Acharya MD Signature Date/Time: 11/21/2022/4:28:09 PM    Final              Recent Labs: 11/21/2022: Hemoglobin 15.2; Magnesium 2.6; Platelets 103 11/22/2022: BUN 22; Creatinine, Ser 1.15; Potassium 3.9; Sodium 137  Recent Lipid Panel No results found for: "CHOL", "TRIG", "HDL", "CHOLHDL", "VLDL", "LDLCALC", "LDLDIRECT"  Physical Exam:    VS:  BP 134/84 (BP Location: Left Arm, Patient Position: Sitting)   Pulse 84   Ht 5\' 3"  (1.6 m)   Wt 199 lb (90.3 kg)   SpO2 96%   BMI 35.25 kg/m     Wt Readings from Last 3 Encounters:  01/10/23 199 lb (90.3 kg)  01/09/23 199 lb (90.3 kg)  11/21/22 198 lb 10.2 oz (90.1 kg)     GEN:  Well nourished, well developed in no acute distress HEENT: Normal NECK: No JVD; No carotid bruits LYMPHATICS: No lymphadenopathy CARDIAC: RRR, faint grade 1/6 systolic ejection murmur aortic area does not involve S2 does not radiate to the carotids RESPIRATORY:  Clear to auscultation without rales, wheezing or rhonchi  ABDOMEN: Soft, non-tender,  non-distended MUSCULOSKELETAL:  No edema; No deformity  SKIN: Warm and dry NEUROLOGIC:  Alert and oriented x 3 PSYCHIATRIC:  Normal affect    Signed, Norman HerrlichBrian Kaizlee Carlino, MD  01/10/2023 1:38 PM    [facility]

## 2023-01-10 ENCOUNTER — Other Ambulatory Visit: Payer: Self-pay

## 2023-01-10 ENCOUNTER — Ambulatory Visit: Payer: Medicare HMO | Attending: Cardiology | Admitting: Cardiology

## 2023-01-10 ENCOUNTER — Encounter: Payer: Self-pay | Admitting: Cardiology

## 2023-01-10 VITALS — BP 134/84 | HR 84 | Ht 63.0 in | Wt 199.0 lb

## 2023-01-10 DIAGNOSIS — I342 Nonrheumatic mitral (valve) stenosis: Secondary | ICD-10-CM

## 2023-01-10 DIAGNOSIS — I110 Hypertensive heart disease with heart failure: Secondary | ICD-10-CM

## 2023-01-10 DIAGNOSIS — I350 Nonrheumatic aortic (valve) stenosis: Secondary | ICD-10-CM | POA: Diagnosis not present

## 2023-01-10 DIAGNOSIS — Z950 Presence of cardiac pacemaker: Secondary | ICD-10-CM

## 2023-01-10 DIAGNOSIS — I442 Atrioventricular block, complete: Secondary | ICD-10-CM

## 2023-01-10 MED ORDER — ROSUVASTATIN CALCIUM 10 MG PO TABS: 10.0000 mg | ORAL_TABLET | Freq: Every day | ORAL | 3 refills | Status: AC

## 2023-01-10 NOTE — Telephone Encounter (Signed)
Requested patients labs from pcp and Rx for Rosuvastatin sent to pharmacy

## 2023-01-10 NOTE — Patient Instructions (Signed)
Medication Instructions:  Your physician recommends that you continue on your current medications as directed. Please refer to the Current Medication list given to you today.  *If you need a refill on your cardiac medications before your next appointment, please call your pharmacy*   Lab Work: None If you have labs (blood work) drawn today and your tests are completely normal, you will receive your results only by: MyChart Message (if you have MyChart) OR A paper copy in the mail If you have any lab test that is abnormal or we need to change your treatment, we will call you to review the results.   Testing/Procedures: Your physician has requested that you have an echocardiogram. Echocardiography is a painless test that uses sound waves to create images of your heart. It provides your doctor with information about the size and shape of your heart and how well your heart's chambers and valves are working. This procedure takes approximately one hour. There are no restrictions for this procedure. Please do NOT wear cologne, perfume, aftershave, or lotions (deodorant is allowed). Please arrive 15 minutes prior to your appointment time.    Follow-Up: At [facility], you and your health needs are our priority.  As part of our continuing mission to provide you with exceptional heart care, we have created designated Provider Care Teams.  These Care Teams include your primary Cardiologist (physician) and Advanced Practice Providers (APPs -  Physician Assistants and Nurse Practitioners) who all work together to provide you with the care you need, when you need it.  We recommend signing up for the patient portal called "MyChart".  Sign up information is provided on this After Visit Summary.  MyChart is used to connect with patients for Virtual Visits (Telemedicine).  Patients are able to view lab/test results, encounter notes, upcoming appointments, etc.  Non-urgent messages can be sent to your  provider as well.   To learn more about what you can do with MyChart, go to [email or web address].    Your next appointment:   6 month(s)  Provider:   Brian Munley, MD    Other Instructions None  

## 2023-01-16 ENCOUNTER — Encounter: Payer: Self-pay | Admitting: Oncology

## 2023-01-20 ENCOUNTER — Ambulatory Visit: Payer: Medicare HMO | Admitting: Podiatry

## 2023-01-28 ENCOUNTER — Encounter: Payer: Self-pay | Admitting: Oncology

## 2023-02-03 ENCOUNTER — Ambulatory Visit: Payer: Medicare HMO | Admitting: Podiatry

## 2023-02-03 DIAGNOSIS — B351 Tinea unguium: Secondary | ICD-10-CM

## 2023-02-03 DIAGNOSIS — L84 Corns and callosities: Secondary | ICD-10-CM | POA: Diagnosis not present

## 2023-02-03 DIAGNOSIS — M79674 Pain in right toe(s): Secondary | ICD-10-CM

## 2023-02-03 DIAGNOSIS — M79675 Pain in left toe(s): Secondary | ICD-10-CM

## 2023-02-03 DIAGNOSIS — E1142 Type 2 diabetes mellitus with diabetic polyneuropathy: Secondary | ICD-10-CM

## 2023-02-03 NOTE — Progress Notes (Signed)
  Subjective:  Patient ID: [name] [name], female    DOB: [birth year],  MRN: [id number]  Chief Complaint  Patient presents with   Nail Problem    Diabetic Foot Care. Patient is currently taking oral medication for diabetes control.     82 y.o. female presents with the above complaint. History confirmed with patient. Patient presenting with pain related to dystrophic thickened elongated nails. Patient is unable to trim own nails related to nail dystrophy and/or mobility issues. Patient does  have a history of T2DM. She aslo has painful callus present plantar aspect of the bilateral 1st met head.   Objective:  Physical Exam: warm, good capillary refill nail exam onychomycosis of the toenails, onycholysis, and dystrophic nails DP pulses palpable, PT pulses palpable, and protective sensation absent Left Foot:  Pain with palpation of nails due to elongation and dystrophic growth. Hyperkeratotic lesion present sub 1st met head, no underlying ulceration Right Foot: Pain with palpation of nails due to elongation and dystrophic growth. Hyperkeratotic lesion present sub 1st met head, no underlying ulceration  Assessment:   1. Pain due to onychomycosis of toenails of both feet   2. Pre-ulcerative calluses   3. Diabetic polyneuropathy associated with type 2 diabetes mellitus (HCC)        Plan:  Patient was evaluated and treated and all questions answered.  #pre ulcerative callus sub 1st met head bilateral All symptomatic hyperkeratoses x2 were safely debrided with a sterile #15 blade to patient's level of comfort without incident. We discussed preventative and palliative care of these lesions including supportive and accommodative shoegear, padding, prefabricated and custom molded accommodative orthoses, use of a pumice stone and lotions/creams daily.   #Onychomycosis with pain  -Nails palliatively debrided as below. -Educated on self-care  Procedure: Nail  Debridement Rationale: Pain Type of Debridement: manual, sharp debridement. Instrumentation: Nail nipper, rotary burr. Number of Nails: 10  Return in about 3 months (around 05/06/2023) for [facility].         [name] [name], DPM [facility] / [facility]

## 2023-02-25 ENCOUNTER — Ambulatory Visit: Payer: Medicare HMO | Attending: Cardiovascular Disease | Admitting: Cardiovascular Disease

## 2023-02-25 ENCOUNTER — Encounter: Payer: Self-pay | Admitting: Cardiovascular Disease

## 2023-02-25 VITALS — BP 102/66 | HR 89 | Ht 63.0 in | Wt 196.6 lb

## 2023-02-25 DIAGNOSIS — I442 Atrioventricular block, complete: Secondary | ICD-10-CM | POA: Diagnosis not present

## 2023-02-25 LAB — CUP PACEART INCLINIC DEVICE CHECK
Battery Remaining Longevity: 124 mo
Battery Voltage: 3.16 V
Brady Statistic AP VP Percent: 29.65 %
Brady Statistic AP VS Percent: 0 %
Brady Statistic AS VP Percent: 70.26 %
Brady Statistic AS VS Percent: 0.09 %
Brady Statistic RA Percent Paced: 29.67 %
Brady Statistic RV Percent Paced: 99.91 %
Date Time Interrogation Session: 20240528163415
Implantable Lead Connection Status: 753985
Implantable Lead Connection Status: 753985
Implantable Lead Implant Date: 20240223
Implantable Lead Implant Date: 20240223
Implantable Lead Location: 753859
Implantable Lead Location: 753860
Implantable Lead Model: 3830
Implantable Lead Model: 5076
Implantable Pulse Generator Implant Date: 20240223
Lead Channel Impedance Value: 456 Ohm
Lead Channel Impedance Value: 456 Ohm
Lead Channel Impedance Value: 608 Ohm
Lead Channel Impedance Value: 646 Ohm
Lead Channel Pacing Threshold Amplitude: 0.75 V
Lead Channel Pacing Threshold Amplitude: 1 V
Lead Channel Pacing Threshold Pulse Width: 0.4 ms
Lead Channel Pacing Threshold Pulse Width: 0.4 ms
Lead Channel Sensing Intrinsic Amplitude: 0 mV
Lead Channel Sensing Intrinsic Amplitude: 2.75 mV
Lead Channel Sensing Intrinsic Amplitude: 2.875 mV
Lead Channel Sensing Intrinsic Amplitude: 22 mV
Lead Channel Setting Pacing Amplitude: 2.75 V
Lead Channel Setting Pacing Amplitude: 2.75 V
Lead Channel Setting Pacing Pulse Width: 0.4 ms
Lead Channel Setting Sensing Sensitivity: 1.2 mV
Zone Setting Status: 755011
Zone Setting Status: 755011

## 2023-02-25 NOTE — Patient Instructions (Signed)
Medication Instructions:  Your physician recommends that you continue on your current medications as directed. Please refer to the Current Medication list given to you today. *If you need a refill on your cardiac medications before your next appointment, please call your pharmacy*   Follow-Up: At [facility], you and your health needs are our priority.  As part of our continuing mission to provide you with exceptional heart care, we have created designated Provider Care Teams.  These Care Teams include your primary Cardiologist (physician) and Advanced Practice Providers (APPs -  Physician Assistants and Nurse Practitioners) who all work together to provide you with the care you need, when you need it.  We recommend signing up for the patient portal called "MyChart".  Sign up information is provided on this After Visit Summary.  MyChart is used to connect with patients for Virtual Visits (Telemedicine).  Patients are able to view lab/test results, encounter notes, upcoming appointments, etc.  Non-urgent messages can be sent to your provider as well.   To learn more about what you can do with MyChart, go to [email or web address].    Your next appointment:   1 year(s)  Provider:   Augustus Mealor, MD  

## 2023-02-25 NOTE — Progress Notes (Signed)
Electrophysiology Office Note:    Date:  02/25/2023   ID:  [name] [name], [place] December 16, 1940, MRN [id number]  PCP:  Abner Greenspan, MD   [facility] Providers Cardiologist:  None Electrophysiologist:  Maurice Small, MD     Referring MD: Abner Greenspan, MD   History of Present Illness:    [name] [name] is a 82 y.o. female with a hx listed below, significant for complete heart block, mild to moderate mitral stenosis and moderate to severe aortic stenosis, chronic CHF with preserved EF who presents for device follow-up.  Patient was admitted in February 2024 with complete heart block.  This occurred in the setting of pre-existing conduction disease as well as a history of moderate to severe valvular disease.  A Medtronic dual-chamber pacemaker was placed without complications.  she has no device related complaints -- no new tenderness, drainage, redness.   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   Mitral valve stenosis    Morbid obesity (HCC)    Muscle pain    Osteopenia after menopause 07/11/2017   Polyposis associated with heterozygous mutation in MUTYH gene 01/03/2022   Sinus problem    Swelling    Tubular adenoma of colon 01/03/2022   Multiple, MUTYH mutation    Past Surgical History:  Procedure Laterality Date   BACK SURGERY  02/14/2014   MASTECTOMY Right 2015   PACEMAKER IMPLANT N/A 11/22/2022   Procedure: PACEMAKER IMPLANT;  Surgeon: Maurice Small, MD;  Location: [facility] INVASIVE CV LAB;  Service: Cardiovascular;  Laterality: N/A;   PARTIAL HYSTERECTOMY  1980   TONSILLECTOMY     TOTAL KNEE ARTHROPLASTY Right 02/07/2011    Current Medications: No outpatient medications have been marked as taking for the 02/25/23 encounter (Office Visit) with Haja Crego, Roberts Gaudy, MD.     Allergies:   Darvon [propoxyphene] and Penicillins   Social and Family History: Reviewed in Epic  ROS:   Please see the history of present illness.    All other systems reviewed and are negative.  EKGs/Labs/Other Studies Reviewed Today:    Echocardiogram:  TTE 11/21/22 EF 65%. LA severely dilated.  Severe calcification of aortic valve with moderate to severe aortic valve stenosis.   Monitors:   Stress testing:   Advanced imaging:   Cardiac catherization   EKG:  Last EKG results: today -- A-V paced   Recent Labs: 11/21/2022: Hemoglobin 15.2; Magnesium 2.6; Platelets 103 11/22/2022: BUN 22; Creatinine, Ser 1.15; Potassium 3.9; Sodium 137     Physical Exam:    VS:  BP 102/66   Pulse 89   Ht 5\' 3"  (1.6 m)   Wt 196 lb 9.6 oz (89.2 kg)   SpO2 96%   BMI 34.83 kg/m     Wt Readings from Last 3 Encounters:  02/25/23 196 lb 9.6 oz (89.2 kg)  01/10/23 199 lb (90.3 kg)  01/09/23 199 lb (90.3 kg)  GEN:  Well nourished, well developed in no acute distress CARDIAC: RRR, no murmurs, rubs, gallops The device site is normal -- no tenderness, edema, drainage, redness, threatened erosion. RESPIRATORY:  Normal work of breathing MUSCULOSKELETAL: no edema    ASSESSMENT & PLAN:    History of complete heart block Dual chamber pacemaker -- normal function  Medtronic dual chamber pacemaker She is device dependent today See PaceArt for details  CHFpEF Continue furosemide 40mg          Medication Adjustments/Labs and Tests Ordered: Current medicines are reviewed at length with the patient today.  Concerns regarding medicines are outlined  above.  No orders of the defined types were placed in this encounter.  No orders of the defined types were placed in this encounter.    Signed, Maurice Small, MD  02/25/2023 3:10 PM    [facility]

## 2023-02-27 ENCOUNTER — Ambulatory Visit (INDEPENDENT_AMBULATORY_CARE_PROVIDER_SITE_OTHER): Payer: Medicare HMO

## 2023-02-27 DIAGNOSIS — I442 Atrioventricular block, complete: Secondary | ICD-10-CM

## 2023-02-27 LAB — CUP PACEART REMOTE DEVICE CHECK
Battery Remaining Longevity: 124 mo
Battery Voltage: 3.16 V
Brady Statistic AP VP Percent: 12.79 %
Brady Statistic AP VS Percent: 0 %
Brady Statistic AS VP Percent: 87.15 %
Brady Statistic AS VS Percent: 0.06 %
Brady Statistic RA Percent Paced: 12.81 %
Brady Statistic RV Percent Paced: 99.94 %
Date Time Interrogation Session: 20240530004938
Implantable Lead Connection Status: 753985
Implantable Lead Connection Status: 753985
Implantable Lead Implant Date: 20240223
Implantable Lead Implant Date: 20240223
Implantable Lead Location: 753859
Implantable Lead Location: 753860
Implantable Lead Model: 3830
Implantable Lead Model: 5076
Implantable Pulse Generator Implant Date: 20240223
Lead Channel Impedance Value: 456 Ohm
Lead Channel Impedance Value: 475 Ohm
Lead Channel Impedance Value: 589 Ohm
Lead Channel Impedance Value: 665 Ohm
Lead Channel Pacing Threshold Amplitude: 0.875 V
Lead Channel Pacing Threshold Amplitude: 1.125 V
Lead Channel Pacing Threshold Pulse Width: 0.4 ms
Lead Channel Pacing Threshold Pulse Width: 0.4 ms
Lead Channel Sensing Intrinsic Amplitude: 22 mV
Lead Channel Sensing Intrinsic Amplitude: 3.75 mV
Lead Channel Sensing Intrinsic Amplitude: 3.75 mV
Lead Channel Setting Pacing Amplitude: 2.75 V
Lead Channel Setting Pacing Amplitude: 2.75 V
Lead Channel Setting Pacing Pulse Width: 0.4 ms
Lead Channel Setting Sensing Sensitivity: 1.2 mV
Zone Setting Status: 755011
Zone Setting Status: 755011

## 2023-03-18 NOTE — Progress Notes (Signed)
Remote pacemaker transmission.   

## 2023-05-05 ENCOUNTER — Ambulatory Visit (INDEPENDENT_AMBULATORY_CARE_PROVIDER_SITE_OTHER): Payer: Medicare HMO | Admitting: Podiatry

## 2023-05-05 DIAGNOSIS — M79675 Pain in left toe(s): Secondary | ICD-10-CM

## 2023-05-05 DIAGNOSIS — M79674 Pain in right toe(s): Secondary | ICD-10-CM

## 2023-05-05 DIAGNOSIS — B351 Tinea unguium: Secondary | ICD-10-CM | POA: Diagnosis not present

## 2023-05-05 DIAGNOSIS — E1142 Type 2 diabetes mellitus with diabetic polyneuropathy: Secondary | ICD-10-CM

## 2023-05-05 NOTE — Progress Notes (Signed)
  Subjective:  Patient ID: [name] [name], female    DOB: 1941-09-29,  MRN: [id number]  Chief Complaint  Patient presents with   Diabetes    DFC A1C - 6.4   Toe Injury    RIGHT 5TH TOE     82 y.o. female presents with the above complaint. History confirmed with patient. Patient presenting with pain related to dystrophic thickened elongated nails. Patient is unable to trim own nails related to nail dystrophy and/or mobility issues. Patient does  have a history of T2DM.   Objective:  Physical Exam: warm, good capillary refill nail exam onychomycosis of the toenails, onycholysis, and dystrophic nails DP pulses palpable, PT pulses palpable, and protective sensation absent Left Foot:  Pain with palpation of nails due to elongation and dystrophic growth. Hyperkeratotic lesion present sub 1st met head, no underlying ulceration Right Foot: Pain with palpation of nails due to elongation and dystrophic growth. Hyperkeratotic lesion present sub 1st met head, no underlying ulceration  Assessment:   1. Pain due to onychomycosis of toenails of both feet   2. Diabetic polyneuropathy associated with type 2 diabetes mellitus (HCC)         Plan:  Patient was evaluated and treated and all questions answered.  #hyperkeratotic lesion sub 1st met head - Patient wants me to hold off on debriding callus do to cost/ has gotten bill in past  #Onychomycosis with pain  -Nails palliatively debrided as below. -Educated on self-care  Procedure: Nail Debridement Rationale: Pain Type of Debridement: manual, sharp debridement. Instrumentation: Nail nipper, rotary burr. Number of Nails: 10  Return in about 3 months (around 08/05/2023) for [facility].         Corinna Gab, DPM [facility] / [facility]

## 2023-05-29 ENCOUNTER — Ambulatory Visit (INDEPENDENT_AMBULATORY_CARE_PROVIDER_SITE_OTHER): Payer: Medicare HMO

## 2023-05-29 DIAGNOSIS — I442 Atrioventricular block, complete: Secondary | ICD-10-CM

## 2023-05-29 LAB — CUP PACEART REMOTE DEVICE CHECK
Battery Remaining Longevity: 142 mo
Battery Voltage: 3.13 V
Brady Statistic AP VP Percent: 20.98 %
Brady Statistic AP VS Percent: 0 %
Brady Statistic AS VP Percent: 78.97 %
Brady Statistic AS VS Percent: 0.05 %
Brady Statistic RA Percent Paced: 20.97 %
Brady Statistic RV Percent Paced: 99.95 %
Date Time Interrogation Session: 20240828224711
Implantable Lead Connection Status: 753985
Implantable Lead Connection Status: 753985
Implantable Lead Implant Date: 20240223
Implantable Lead Implant Date: 20240223
Implantable Lead Location: 753859
Implantable Lead Location: 753860
Implantable Lead Model: 3830
Implantable Lead Model: 5076
Implantable Pulse Generator Implant Date: 20240223
Lead Channel Impedance Value: 399 Ohm
Lead Channel Impedance Value: 399 Ohm
Lead Channel Impedance Value: 570 Ohm
Lead Channel Impedance Value: 608 Ohm
Lead Channel Pacing Threshold Amplitude: 0.875 V
Lead Channel Pacing Threshold Amplitude: 1.125 V
Lead Channel Pacing Threshold Pulse Width: 0.4 ms
Lead Channel Pacing Threshold Pulse Width: 0.4 ms
Lead Channel Sensing Intrinsic Amplitude: 2.125 mV
Lead Channel Sensing Intrinsic Amplitude: 2.125 mV
Lead Channel Sensing Intrinsic Amplitude: 22 mV
Lead Channel Setting Pacing Amplitude: 2 V
Lead Channel Setting Pacing Amplitude: 2.5 V
Lead Channel Setting Pacing Pulse Width: 0.4 ms
Lead Channel Setting Sensing Sensitivity: 1.2 mV
Zone Setting Status: 755011
Zone Setting Status: 755011

## 2023-06-01 DIAGNOSIS — E039 Hypothyroidism, unspecified: Secondary | ICD-10-CM | POA: Diagnosis not present

## 2023-06-01 DIAGNOSIS — E1169 Type 2 diabetes mellitus with other specified complication: Secondary | ICD-10-CM | POA: Diagnosis not present

## 2023-06-04 NOTE — Progress Notes (Signed)
Remote pacemaker transmission.   

## 2023-07-01 DIAGNOSIS — E1169 Type 2 diabetes mellitus with other specified complication: Secondary | ICD-10-CM | POA: Diagnosis not present

## 2023-07-01 DIAGNOSIS — E039 Hypothyroidism, unspecified: Secondary | ICD-10-CM | POA: Diagnosis not present

## 2023-08-01 DIAGNOSIS — E1169 Type 2 diabetes mellitus with other specified complication: Secondary | ICD-10-CM | POA: Diagnosis not present

## 2023-08-01 DIAGNOSIS — E039 Hypothyroidism, unspecified: Secondary | ICD-10-CM | POA: Diagnosis not present

## 2023-08-05 ENCOUNTER — Ambulatory Visit (INDEPENDENT_AMBULATORY_CARE_PROVIDER_SITE_OTHER): Payer: Medicare HMO | Admitting: Podiatry

## 2023-08-05 DIAGNOSIS — Z91199 Patient's noncompliance with other medical treatment and regimen due to unspecified reason: Secondary | ICD-10-CM

## 2023-08-05 NOTE — Progress Notes (Signed)
.  afsno

## 2023-08-08 ENCOUNTER — Ambulatory Visit: Payer: Medicare HMO | Attending: Cardiology

## 2023-08-08 DIAGNOSIS — Z950 Presence of cardiac pacemaker: Secondary | ICD-10-CM

## 2023-08-08 DIAGNOSIS — I342 Nonrheumatic mitral (valve) stenosis: Secondary | ICD-10-CM | POA: Diagnosis not present

## 2023-08-08 DIAGNOSIS — I350 Nonrheumatic aortic (valve) stenosis: Secondary | ICD-10-CM | POA: Diagnosis not present

## 2023-08-08 DIAGNOSIS — I442 Atrioventricular block, complete: Secondary | ICD-10-CM | POA: Diagnosis not present

## 2023-08-08 DIAGNOSIS — I110 Hypertensive heart disease with heart failure: Secondary | ICD-10-CM | POA: Diagnosis not present

## 2023-08-09 LAB — ECHOCARDIOGRAM COMPLETE
AR max vel: 0.9 cm2
AV Area VTI: 0.9 cm2
AV Area mean vel: 0.87 cm2
AV Mean grad: 31.8 mm[Hg]
AV Peak grad: 56 mm[Hg]
Ao pk vel: 3.74 m/s
Area-P 1/2: 1.44 cm2
MV VTI: 1.25 cm2
S' Lateral: 2.6 cm

## 2023-08-11 ENCOUNTER — Telehealth: Payer: Self-pay | Admitting: Cardiology

## 2023-08-11 NOTE — Telephone Encounter (Signed)
  Pt is returning call to get echo result. Pt said, just to call her tomorrow morning

## 2023-08-12 NOTE — Telephone Encounter (Signed)
Patient informed of results.  

## 2023-08-12 NOTE — Telephone Encounter (Signed)
Patient is returning call to discuss echo results. °

## 2023-08-29 ENCOUNTER — Ambulatory Visit (INDEPENDENT_AMBULATORY_CARE_PROVIDER_SITE_OTHER): Payer: Medicare HMO

## 2023-08-29 DIAGNOSIS — I442 Atrioventricular block, complete: Secondary | ICD-10-CM | POA: Diagnosis not present

## 2023-08-29 LAB — CUP PACEART REMOTE DEVICE CHECK
Battery Remaining Longevity: 142 mo
Battery Voltage: 3.08 V
Brady Statistic AP VP Percent: 23.28 %
Brady Statistic AP VS Percent: 0 %
Brady Statistic AS VP Percent: 76.69 %
Brady Statistic AS VS Percent: 0.03 %
Brady Statistic RA Percent Paced: 23.26 %
Brady Statistic RV Percent Paced: 99.97 %
Date Time Interrogation Session: 20241128211518
Implantable Lead Connection Status: 753985
Implantable Lead Connection Status: 753985
Implantable Lead Implant Date: 20240223
Implantable Lead Implant Date: 20240223
Implantable Lead Location: 753859
Implantable Lead Location: 753860
Implantable Lead Model: 3830
Implantable Lead Model: 5076
Implantable Pulse Generator Implant Date: 20240223
Lead Channel Impedance Value: 399 Ohm
Lead Channel Impedance Value: 418 Ohm
Lead Channel Impedance Value: 532 Ohm
Lead Channel Impedance Value: 551 Ohm
Lead Channel Pacing Threshold Amplitude: 0.875 V
Lead Channel Pacing Threshold Amplitude: 1 V
Lead Channel Pacing Threshold Pulse Width: 0.4 ms
Lead Channel Pacing Threshold Pulse Width: 0.4 ms
Lead Channel Sensing Intrinsic Amplitude: 2.625 mV
Lead Channel Sensing Intrinsic Amplitude: 2.625 mV
Lead Channel Sensing Intrinsic Amplitude: 22 mV
Lead Channel Setting Pacing Amplitude: 1.75 V
Lead Channel Setting Pacing Amplitude: 2 V
Lead Channel Setting Pacing Pulse Width: 0.4 ms
Lead Channel Setting Sensing Sensitivity: 1.2 mV
Zone Setting Status: 755011
Zone Setting Status: 755011

## 2023-08-30 DIAGNOSIS — J449 Chronic obstructive pulmonary disease, unspecified: Secondary | ICD-10-CM | POA: Diagnosis not present

## 2023-08-30 DIAGNOSIS — E1169 Type 2 diabetes mellitus with other specified complication: Secondary | ICD-10-CM | POA: Diagnosis not present

## 2023-08-31 DIAGNOSIS — E1169 Type 2 diabetes mellitus with other specified complication: Secondary | ICD-10-CM | POA: Diagnosis not present

## 2023-08-31 DIAGNOSIS — E039 Hypothyroidism, unspecified: Secondary | ICD-10-CM | POA: Diagnosis not present

## 2023-09-04 DIAGNOSIS — I129 Hypertensive chronic kidney disease with stage 1 through stage 4 chronic kidney disease, or unspecified chronic kidney disease: Secondary | ICD-10-CM | POA: Diagnosis not present

## 2023-09-04 DIAGNOSIS — E039 Hypothyroidism, unspecified: Secondary | ICD-10-CM | POA: Diagnosis not present

## 2023-09-04 DIAGNOSIS — E1169 Type 2 diabetes mellitus with other specified complication: Secondary | ICD-10-CM | POA: Diagnosis not present

## 2023-09-11 DIAGNOSIS — E1169 Type 2 diabetes mellitus with other specified complication: Secondary | ICD-10-CM | POA: Diagnosis not present

## 2023-09-11 DIAGNOSIS — I129 Hypertensive chronic kidney disease with stage 1 through stage 4 chronic kidney disease, or unspecified chronic kidney disease: Secondary | ICD-10-CM | POA: Diagnosis not present

## 2023-09-11 DIAGNOSIS — Z23 Encounter for immunization: Secondary | ICD-10-CM | POA: Diagnosis not present

## 2023-09-11 DIAGNOSIS — E782 Mixed hyperlipidemia: Secondary | ICD-10-CM | POA: Diagnosis not present

## 2023-09-11 DIAGNOSIS — Z6837 Body mass index (BMI) 37.0-37.9, adult: Secondary | ICD-10-CM | POA: Diagnosis not present

## 2023-09-11 DIAGNOSIS — N183 Chronic kidney disease, stage 3 unspecified: Secondary | ICD-10-CM | POA: Diagnosis not present

## 2023-09-12 ENCOUNTER — Other Ambulatory Visit: Payer: Self-pay

## 2023-09-12 DIAGNOSIS — Z5986 Financial insecurity: Secondary | ICD-10-CM

## 2023-09-15 ENCOUNTER — Telehealth: Payer: Self-pay | Admitting: *Deleted

## 2023-09-15 NOTE — Progress Notes (Signed)
  Care Coordination   Note   09/15/2023 Name: Norlene Hodgkin MRN: [id number] DOB: Dec 09, 1940  Burman Nieves Sherree Wichman is a 82 y.o. year old female who sees Abner Greenspan, MD for primary care. I reached out to [employer] by phone today to offer care coordination services.  Ms. Yong was given information about Care Coordination services today including:   The Care Coordination services include support from the care team which includes your Nurse Coordinator, Clinical Social Worker, or Pharmacist.  The Care Coordination team is here to help remove barriers to the health concerns and goals most important to you. Care Coordination services are voluntary, and the patient may decline or stop services at any time by request to their care team member.   Care Coordination Consent Status: Patient agreed to services and verbal consent obtained.   Follow up plan:  Telephone appointment with care coordination team member scheduled for:  09/26/2023  Encounter Outcome:  Patient Scheduled   Burman Nieves, [facility] Care Coordination Care Guide Direct Dial: [phone]

## 2023-09-15 NOTE — Progress Notes (Signed)
  Care Coordination  Outreach Note  09/15/2023 Name: [name] [name] MRN: [id number] DOB: 04-Mar-1941   Care Coordination Outreach Attempts: An unsuccessful telephone outreach was attempted today to offer the patient information about available care coordination services.  Follow Up Plan:  Additional outreach attempts will be made to offer the patient care coordination information and services.   Encounter Outcome:  No Answer  Burman Nieves, CCMA Care Coordination Care Guide Direct Dial: [phone]

## 2023-09-19 NOTE — Progress Notes (Signed)
Remote pacemaker transmission.   

## 2023-09-26 ENCOUNTER — Ambulatory Visit: Payer: Self-pay

## 2023-09-26 NOTE — Patient Outreach (Signed)
  Care Coordination   Initial Visit Note   09/26/2023 Name: Anicka Gowen MRN: [id number] DOB: Nov 08, 1940  Burman Nieves Kymberli Tippit is a 82 y.o. year old female who sees Abner Greenspan, MD for primary care. I spoke with  [name] by phone today.  What matters to the patients health and wellness today?  Patient requests home repairs.    Goals Addressed             This Visit's Progress    Care Coordination Activities       Interventions Today    Flowsheet Row Most Recent Value  Chronic Disease   Chronic disease during today's visit Diabetes, Hypertension (HTN), Chronic Kidney Disease/End Stage Renal Disease (ESRD), Chronic Obstructive Pulmonary Disease (COPD)  General Interventions   General Interventions Discussed/Reviewed General Interventions Discussed, General Interventions Reviewed  [Pt bathroom floor needs repairs. Pt son owns the home and he does not live there. [employer] requires the application to own the home. Pt reports she is saving and her grandson will do the repair. Pt reports the home is liveable.]              SDOH assessments and interventions completed:  Yes  SDOH Interventions Today    Flowsheet Row Most Recent Value  SDOH Interventions   Food Insecurity Interventions Intervention Not Indicated  Housing Interventions Intervention Not Indicated  Transportation Interventions Intervention Not Indicated, Other (Comment)  [Shares a car with her son]  Utilities Interventions Intervention Not Indicated        Care Coordination Interventions:  Yes, provided   Follow up plan: No further intervention required.   Encounter Outcome:  Patient Visit Completed

## 2023-09-26 NOTE — Patient Instructions (Signed)
Visit Information  Thank you for taking time to visit with me today. Please don't hesitate to contact me if I can be of assistance to you.   Following are the goals we discussed today:  Patient will continue to save toward the cost of supplies and have her grandson repair the flooring.    If you are experiencing a Mental Health or Behavioral Health Crisis or need someone to talk to, please call 911  Patient verbalizes understanding of instructions and care plan provided today and agrees to view in MyChart. Active MyChart status and patient understanding of how to access instructions and care plan via MyChart confirmed with patient.     No further follow up required: Patient does not request additional follow up.  Lysle Morales, BSW Social Worker [phone]

## 2023-09-30 DIAGNOSIS — H43812 Vitreous degeneration, left eye: Secondary | ICD-10-CM | POA: Diagnosis not present

## 2023-09-30 DIAGNOSIS — H53001 Unspecified amblyopia, right eye: Secondary | ICD-10-CM | POA: Diagnosis not present

## 2023-09-30 DIAGNOSIS — E119 Type 2 diabetes mellitus without complications: Secondary | ICD-10-CM | POA: Diagnosis not present

## 2023-09-30 DIAGNOSIS — H16223 Keratoconjunctivitis sicca, not specified as Sjogren's, bilateral: Secondary | ICD-10-CM | POA: Diagnosis not present

## 2023-09-30 DIAGNOSIS — H43392 Other vitreous opacities, left eye: Secondary | ICD-10-CM | POA: Diagnosis not present

## 2023-09-30 DIAGNOSIS — H18519 Endothelial corneal dystrophy, unspecified eye: Secondary | ICD-10-CM | POA: Diagnosis not present

## 2023-09-30 DIAGNOSIS — H524 Presbyopia: Secondary | ICD-10-CM | POA: Diagnosis not present

## 2023-10-01 DIAGNOSIS — E1169 Type 2 diabetes mellitus with other specified complication: Secondary | ICD-10-CM | POA: Diagnosis not present

## 2023-10-01 DIAGNOSIS — E039 Hypothyroidism, unspecified: Secondary | ICD-10-CM | POA: Diagnosis not present

## 2023-11-01 DIAGNOSIS — E1169 Type 2 diabetes mellitus with other specified complication: Secondary | ICD-10-CM | POA: Diagnosis not present

## 2023-11-01 DIAGNOSIS — E039 Hypothyroidism, unspecified: Secondary | ICD-10-CM | POA: Diagnosis not present

## 2023-11-28 ENCOUNTER — Ambulatory Visit (INDEPENDENT_AMBULATORY_CARE_PROVIDER_SITE_OTHER): Payer: Medicare HMO

## 2023-11-28 DIAGNOSIS — I442 Atrioventricular block, complete: Secondary | ICD-10-CM | POA: Diagnosis not present

## 2023-11-29 DIAGNOSIS — E039 Hypothyroidism, unspecified: Secondary | ICD-10-CM | POA: Diagnosis not present

## 2023-11-29 DIAGNOSIS — E1169 Type 2 diabetes mellitus with other specified complication: Secondary | ICD-10-CM | POA: Diagnosis not present

## 2023-12-01 LAB — CUP PACEART REMOTE DEVICE CHECK
Battery Remaining Longevity: 137 mo
Battery Voltage: 3.05 V
Brady Statistic AP VP Percent: 21.62 %
Brady Statistic AP VS Percent: 0 %
Brady Statistic AS VP Percent: 78.38 %
Brady Statistic AS VS Percent: 0.01 %
Brady Statistic RA Percent Paced: 21.58 %
Brady Statistic RV Percent Paced: 99.99 %
Date Time Interrogation Session: 20250227190545
Implantable Lead Connection Status: 753985
Implantable Lead Connection Status: 753985
Implantable Lead Implant Date: 20240223
Implantable Lead Implant Date: 20240223
Implantable Lead Location: 753859
Implantable Lead Location: 753860
Implantable Lead Model: 3830
Implantable Lead Model: 5076
Implantable Pulse Generator Implant Date: 20240223
Lead Channel Impedance Value: 361 Ohm
Lead Channel Impedance Value: 399 Ohm
Lead Channel Impedance Value: 532 Ohm
Lead Channel Impedance Value: 532 Ohm
Lead Channel Pacing Threshold Amplitude: 1 V
Lead Channel Pacing Threshold Amplitude: 1 V
Lead Channel Pacing Threshold Pulse Width: 0.4 ms
Lead Channel Pacing Threshold Pulse Width: 0.4 ms
Lead Channel Sensing Intrinsic Amplitude: 2.125 mV
Lead Channel Sensing Intrinsic Amplitude: 2.125 mV
Lead Channel Sensing Intrinsic Amplitude: 22 mV
Lead Channel Setting Pacing Amplitude: 2 V
Lead Channel Setting Pacing Amplitude: 2 V
Lead Channel Setting Pacing Pulse Width: 0.4 ms
Lead Channel Setting Sensing Sensitivity: 1.2 mV
Zone Setting Status: 755011
Zone Setting Status: 755011

## 2023-12-10 DIAGNOSIS — I442 Atrioventricular block, complete: Secondary | ICD-10-CM | POA: Diagnosis not present

## 2023-12-10 DIAGNOSIS — I509 Heart failure, unspecified: Secondary | ICD-10-CM | POA: Diagnosis not present

## 2023-12-10 DIAGNOSIS — Z950 Presence of cardiac pacemaker: Secondary | ICD-10-CM | POA: Diagnosis not present

## 2023-12-10 DIAGNOSIS — Z008 Encounter for other general examination: Secondary | ICD-10-CM | POA: Diagnosis not present

## 2023-12-10 DIAGNOSIS — Z6835 Body mass index (BMI) 35.0-35.9, adult: Secondary | ICD-10-CM | POA: Diagnosis not present

## 2023-12-10 DIAGNOSIS — N1832 Chronic kidney disease, stage 3b: Secondary | ICD-10-CM | POA: Diagnosis not present

## 2023-12-10 DIAGNOSIS — R2681 Unsteadiness on feet: Secondary | ICD-10-CM | POA: Diagnosis not present

## 2023-12-10 DIAGNOSIS — E261 Secondary hyperaldosteronism: Secondary | ICD-10-CM | POA: Diagnosis not present

## 2023-12-10 DIAGNOSIS — I130 Hypertensive heart and chronic kidney disease with heart failure and stage 1 through stage 4 chronic kidney disease, or unspecified chronic kidney disease: Secondary | ICD-10-CM | POA: Diagnosis not present

## 2023-12-10 DIAGNOSIS — Z9981 Dependence on supplemental oxygen: Secondary | ICD-10-CM | POA: Diagnosis not present

## 2023-12-10 DIAGNOSIS — E785 Hyperlipidemia, unspecified: Secondary | ICD-10-CM | POA: Diagnosis not present

## 2023-12-10 DIAGNOSIS — Z853 Personal history of malignant neoplasm of breast: Secondary | ICD-10-CM | POA: Diagnosis not present

## 2023-12-10 DIAGNOSIS — J4540 Moderate persistent asthma, uncomplicated: Secondary | ICD-10-CM | POA: Diagnosis not present

## 2023-12-16 ENCOUNTER — Other Ambulatory Visit (HOSPITAL_BASED_OUTPATIENT_CLINIC_OR_DEPARTMENT_OTHER): Payer: Self-pay | Admitting: Family Medicine

## 2023-12-16 ENCOUNTER — Ambulatory Visit (INDEPENDENT_AMBULATORY_CARE_PROVIDER_SITE_OTHER)
Admission: RE | Admit: 2023-12-16 | Discharge: 2023-12-16 | Disposition: A | Discharge status: Home / Self Care | Source: Ambulatory Visit | Attending: Family Medicine | Admitting: Family Medicine

## 2023-12-16 DIAGNOSIS — Z6837 Body mass index (BMI) 37.0-37.9, adult: Secondary | ICD-10-CM | POA: Diagnosis not present

## 2023-12-16 DIAGNOSIS — Z96612 Presence of left artificial shoulder joint: Secondary | ICD-10-CM | POA: Diagnosis not present

## 2023-12-16 DIAGNOSIS — R051 Acute cough: Secondary | ICD-10-CM

## 2023-12-16 DIAGNOSIS — R059 Cough, unspecified: Secondary | ICD-10-CM

## 2023-12-16 DIAGNOSIS — R918 Other nonspecific abnormal finding of lung field: Secondary | ICD-10-CM | POA: Diagnosis not present

## 2023-12-16 DIAGNOSIS — Z20822 Contact with and (suspected) exposure to covid-19: Secondary | ICD-10-CM | POA: Diagnosis not present

## 2023-12-16 DIAGNOSIS — J449 Chronic obstructive pulmonary disease, unspecified: Secondary | ICD-10-CM | POA: Diagnosis not present

## 2023-12-16 DIAGNOSIS — R058 Other specified cough: Secondary | ICD-10-CM | POA: Diagnosis not present

## 2023-12-16 DIAGNOSIS — I5030 Unspecified diastolic (congestive) heart failure: Secondary | ICD-10-CM | POA: Diagnosis not present

## 2023-12-16 DIAGNOSIS — R0989 Other specified symptoms and signs involving the circulatory and respiratory systems: Secondary | ICD-10-CM | POA: Diagnosis not present

## 2023-12-26 NOTE — Addendum Note (Signed)
 Addended by: Elease Etienne A on: 12/26/2023 09:09 AM   Modules accepted: Orders

## 2023-12-26 NOTE — Progress Notes (Signed)
 Remote pacemaker transmission.

## 2023-12-30 DIAGNOSIS — I10 Essential (primary) hypertension: Secondary | ICD-10-CM | POA: Diagnosis not present

## 2023-12-30 DIAGNOSIS — D692 Other nonthrombocytopenic purpura: Secondary | ICD-10-CM | POA: Diagnosis not present

## 2023-12-30 DIAGNOSIS — Z6837 Body mass index (BMI) 37.0-37.9, adult: Secondary | ICD-10-CM | POA: Diagnosis not present

## 2024-01-01 DIAGNOSIS — I509 Heart failure, unspecified: Secondary | ICD-10-CM | POA: Diagnosis not present

## 2024-01-01 DIAGNOSIS — G4762 Sleep related leg cramps: Secondary | ICD-10-CM | POA: Diagnosis not present

## 2024-01-01 DIAGNOSIS — Z6836 Body mass index (BMI) 36.0-36.9, adult: Secondary | ICD-10-CM | POA: Diagnosis not present

## 2024-01-01 DIAGNOSIS — J9610 Chronic respiratory failure, unspecified whether with hypoxia or hypercapnia: Secondary | ICD-10-CM | POA: Diagnosis not present

## 2024-01-01 DIAGNOSIS — N1832 Chronic kidney disease, stage 3b: Secondary | ICD-10-CM | POA: Diagnosis not present

## 2024-01-05 DIAGNOSIS — Z1231 Encounter for screening mammogram for malignant neoplasm of breast: Secondary | ICD-10-CM | POA: Diagnosis not present

## 2024-01-05 DIAGNOSIS — Z853 Personal history of malignant neoplasm of breast: Secondary | ICD-10-CM | POA: Diagnosis not present

## 2024-01-05 LAB — HM MAMMOGRAPHY

## 2024-01-08 ENCOUNTER — Ambulatory Visit: Attending: Cardiology | Admitting: Cardiology

## 2024-01-08 ENCOUNTER — Encounter: Payer: Self-pay | Admitting: Cardiology

## 2024-01-08 VITALS — BP 127/75 | HR 73 | Ht 63.0 in | Wt 202.0 lb

## 2024-01-08 DIAGNOSIS — I129 Hypertensive chronic kidney disease with stage 1 through stage 4 chronic kidney disease, or unspecified chronic kidney disease: Secondary | ICD-10-CM | POA: Diagnosis not present

## 2024-01-08 DIAGNOSIS — I5032 Chronic diastolic (congestive) heart failure: Secondary | ICD-10-CM

## 2024-01-08 DIAGNOSIS — I350 Nonrheumatic aortic (valve) stenosis: Secondary | ICD-10-CM

## 2024-01-08 DIAGNOSIS — E039 Hypothyroidism, unspecified: Secondary | ICD-10-CM | POA: Diagnosis not present

## 2024-01-08 DIAGNOSIS — I10 Essential (primary) hypertension: Secondary | ICD-10-CM

## 2024-01-08 DIAGNOSIS — E782 Mixed hyperlipidemia: Secondary | ICD-10-CM | POA: Diagnosis not present

## 2024-01-08 DIAGNOSIS — Z950 Presence of cardiac pacemaker: Secondary | ICD-10-CM | POA: Diagnosis not present

## 2024-01-08 DIAGNOSIS — I442 Atrioventricular block, complete: Secondary | ICD-10-CM | POA: Diagnosis not present

## 2024-01-08 DIAGNOSIS — E1169 Type 2 diabetes mellitus with other specified complication: Secondary | ICD-10-CM | POA: Diagnosis not present

## 2024-01-08 NOTE — Patient Instructions (Signed)
Medication Instructions:  Your physician recommends that you continue on your current medications as directed. Please refer to the Current Medication list given to you today.  *If you need a refill on your cardiac medications before your next appointment, please call your pharmacy*   Lab Work: None ordered If you have labs (blood work) drawn today and your tests are completely normal, you will receive your results only by: MyChart Message (if you have MyChart) OR A paper copy in the mail If you have any lab test that is abnormal or we need to change your treatment, we will call you to review the results.   Testing/Procedures: None ordered   Follow-Up: At [facility], you and your health needs are our priority.  As part of our continuing mission to provide you with exceptional heart care, we have created designated Provider Care Teams.  These Care Teams include your primary Cardiologist (physician) and Advanced Practice Providers (APPs -  Physician Assistants and Nurse Practitioners) who all work together to provide you with the care you need, when you need it.  We recommend signing up for the patient portal called "MyChart".  Sign up information is provided on this After Visit Summary.  MyChart is used to connect with patients for Virtual Visits (Telemedicine).  Patients are able to view lab/test results, encounter notes, upcoming appointments, etc.  Non-urgent messages can be sent to your provider as well.   To learn more about what you can do with MyChart, go to [email or web address].    Your next appointment:   6 month(s)  The format for your next appointment:   In Person  Provider:   Brian Munley, MD or Jennifer Woody, NP ([place])    Other Instructions none  Important Information About Sugar      

## 2024-01-08 NOTE — Progress Notes (Signed)
 Cardiology Office Note:  .   Date:  01/08/2024  ID:  [name] [name], DOB [birth year], MRN [id number] PCP: Abner Greenspan, MD  [facility] Providers Cardiologist:  Norman Herrlich, MD Electrophysiologist:  Maurice Small, MD    History of Present Illness: .   [name] [name] is a 83 y.o. female with a past medical history of aortic stenosis, hypertension, CKD, complete heart block s/p PPM, HFpEF, mitral valve stenosis, COPD, DM 2, hypothyroidism, dyslipidemia, history of breast cancer.  12/01/2023 device check normal device check 08/09/2023 echo EF 60-65%, moderate LVH, grade 1 DD, LA severely dilated, mild mitral valve and moderate stenosis, moderate to severe aortic valve stenosis 11/22/2022 PPM implantation   Evaluated by Dr. Dulce Sellar on 01/10/2023, she was asymptomatic, plans for repeat echocardiogram were arranged since she had underwent PPM implantation and it was felt her previous severe aortic stenosis was exacerbated by her complete heart block.  Echocardiogram completed on 08/09/2023 was overall unchanged.  Evaluated by Dr. Nelly Laurence on 02/25/2023, she had been recently admitted in February 2024 with a complete heart block, and and recently underwent PPM implantation.  No changes were made she was advised to follow-up in EP in 1 year.  She presents today for follow-up of her aortic stenosis.  She has been doing well since she was last evaluated in our office.  She does states she had some type of upper respiratory illness, has been following closely with her PCP, had a chest x-ray possibly that revealed some fluid in her lungs and her diuretic was adjusted.  She does tell me yesterday she was working out in her garden, dumping large bags of mulch and she was able to do this okay.  She stays busy, does not like to sit oddly however she does not pain any formal exercise. She denies chest pain, palpitations, dyspnea, pnd, orthopnea, n, v, dizziness, syncope, edema,  weight gain, or early satiety.   Repeat echo.  ROS: Review of Systems  Constitutional:  Positive for malaise/fatigue.  All other systems reviewed and are negative.    Studies Reviewed: .        Cardiac Studies & Procedures   ______________________________________________________________________________________________     ECHOCARDIOGRAM  ECHOCARDIOGRAM COMPLETE 08/08/2023  Narrative ECHOCARDIOGRAM REPORT    Patient Name:   [name] [name] Date of Exam: 08/08/2023 Medical Rec #:  [id number]                    Height:       63.0 in Accession #:    [id number]                   Weight:       196.6 lb Date of Birth:  1941-07-28                   BSA:          1.920 m Patient Age:    81 years                     BP:           102/66 mmHg Patient Gender: F                            HR:           78 bpm. Exam Location:  [place]  Procedure: 2D Echo, Cardiac Doppler, Color Doppler  and Strain Analysis  Indications:    Aortic valve stenosis, moderate [I35.0 (ICD-10-CM)]; Nonrheumatic mitral valve stenosis [I34.2 (ICD-10-CM)]; Heart block AV complete (HCC) [I44.2 (ICD-10-CM)]; Pacemaker [Z95.0 (ICD-10-CM)]; Hypertensive heart disease with heart failure (HCC) [I11.0 (ICD-10-CM)]  History:        Patient has prior history of Echocardiogram examinations, most recent 11/21/2022. CHF, Pacemaker, Aortic Valve Disease and Mitral Valve Disease; Risk Factors:Hypertension.  Sonographer:    Margreta Journey RDCS Referring Phys: [id number] BRIAN J MUNLEY  IMPRESSIONS   1. Left ventricular ejection fraction, by estimation, is 60 to 65%. The left ventricle has normal function. The left ventricle has no regional wall motion abnormalities. There is moderate left ventricular hypertrophy. Left ventricular diastolic parameters are consistent with Grade I diastolic dysfunction (impaired relaxation).GLS-11.8% 2. Right ventricular systolic function is normal. The right ventricular size is  normal. There is normal pulmonary artery systolic pressure. 3. Left atrial size was severely dilated. 4. The mitral valve was not well visualized. Mild mitral valve regurgitation. Moderate mitral stenosis. 5. The aortic valve was not well visualized. Aortic valve regurgitation is not visualized. Moderate to severe aortic valve stenosis. 6. The inferior vena cava is normal in size with greater than 50% respiratory variability, suggesting right atrial pressure of 3 mmHg.  FINDINGS Left Ventricle: Left ventricular ejection fraction, by estimation, is 60 to 65%. The left ventricle has normal function. The left ventricle has no regional wall motion abnormalities. The left ventricular internal cavity size was normal in size. There is moderate left ventricular hypertrophy. Left ventricular diastolic parameters are consistent with Grade I diastolic dysfunction (impaired relaxation).  Right Ventricle: The right ventricular size is normal. No increase in right ventricular wall thickness. Right ventricular systolic function is normal. There is normal pulmonary artery systolic pressure. The tricuspid regurgitant velocity is 2.52 m/s, and with an assumed right atrial pressure of 3 mmHg, the estimated right ventricular systolic pressure is 28.4 mmHg.  Left Atrium: Left atrial size was severely dilated.  Right Atrium: Right atrial size was normal in size.  Pericardium: There is no evidence of pericardial effusion.  Mitral Valve: The mitral valve was not well visualized. Mild mitral valve regurgitation. Moderate mitral valve stenosis. MV peak gradient, 15.2 mmHg. The mean mitral valve gradient is 6.0 mmHg.  Tricuspid Valve: The tricuspid valve is normal in structure. Tricuspid valve regurgitation is not demonstrated. No evidence of tricuspid stenosis.  Aortic Valve: The aortic valve was not well visualized. Aortic valve regurgitation is not visualized. Moderate to severe aortic stenosis is present. Aortic  valve mean gradient measures 31.8 mmHg. Aortic valve peak gradient measures 56.0 mmHg. Aortic valve area, by VTI measures 0.90 cm.  Pulmonic Valve: The pulmonic valve was normal in structure. Pulmonic valve regurgitation is not visualized. No evidence of pulmonic stenosis.  Aorta: The aortic root is normal in size and structure.  Venous: The inferior vena cava is normal in size with greater than 50% respiratory variability, suggesting right atrial pressure of 3 mmHg.  IAS/Shunts: No atrial level shunt detected by color flow Doppler.   LEFT VENTRICLE PLAX 2D LVIDd:         3.50 cm   Diastology LVIDs:         2.60 cm   LV e' medial:    3.26 cm/s LV PW:         1.40 cm   LV E/e' medial:  38.0 LV IVS:        1.40 cm   LV e' lateral:  6.31 cm/s LVOT diam:     1.80 cm   LV E/e' lateral: 19.7 LV SV:         75 LV SV Index:   39 LVOT Area:     2.54 cm   RIGHT VENTRICLE             IVC RV Basal diam:  2.90 cm     IVC diam: 1.40 cm RV Mid diam:    2.70 cm RV S prime:     10.60 cm/s TAPSE (M-mode): 1.6 cm  LEFT ATRIUM             Index        RIGHT ATRIUM           Index LA diam:        4.50 cm 2.34 cm/m   RA Area:     11.40 cm LA Vol (A2C):   97.2 ml 50.64 ml/m  RA Volume:   21.20 ml  11.04 ml/m LA Vol (A4C):   92.7 ml 48.29 ml/m LA Biplane Vol: 95.9 ml 49.96 ml/m AORTIC VALVE AV Area (Vmax):    0.90 cm AV Area (Vmean):   0.87 cm AV Area (VTI):     0.90 cm AV Vmax:           374.00 cm/s AV Vmean:          263.750 cm/s AV VTI:            0.838 m AV Peak Grad:      56.0 mmHg AV Mean Grad:      31.8 mmHg LVOT Vmax:         131.67 cm/s LVOT Vmean:        90.300 cm/s LVOT VTI:          0.296 m LVOT/AV VTI ratio: 0.35  AORTA Ao Root diam: 3.10 cm Ao Asc diam:  3.10 cm Ao Desc diam: 2.60 cm  MITRAL VALVE                TRICUSPID VALVE MV Area (PHT): 1.44 cm     TR Peak grad:   25.4 mmHg MV Area VTI:   1.25 cm     TR Vmax:        252.00 cm/s MV Peak grad:  15.2  mmHg MV Mean grad:  6.0 mmHg     SHUNTS MV Vmax:       1.95 m/s     Systemic VTI:  0.30 m MV Vmean:      116.0 cm/s   Systemic Diam: 1.80 cm MV Decel Time: 525 msec MV E velocity: 124.00 cm/s MV A velocity: 170.00 cm/s MV E/A ratio:  0.73  Glean Hess Revankar MD Electronically signed by Belva Crome MD Signature Date/Time: 08/09/2023/4:42:29 PM    Final          ______________________________________________________________________________________________      Risk Assessment/Calculations:             Physical Exam:   VS:  BP 127/75   Pulse 73   Ht 5\' 3"  (1.6 m)   Wt 202 lb (91.6 kg)   SpO2 96%   BMI 35.78 kg/m    Wt Readings from Last 3 Encounters:  01/08/24 202 lb (91.6 kg)  02/25/23 196 lb 9.6 oz (89.2 kg)  01/10/23 199 lb (90.3 kg)    GEN: Well nourished, well developed in no acute distress NECK: No JVD; No carotid bruits CARDIAC: RRR, 2/6 systolic murmur that does not  radiate to the carotids RESPIRATORY:  Clear to auscultation without rales, wheezing or rhonchi  ABDOMEN: Soft, non-tender, non-distended EXTREMITIES:  No edema; No deformity   ASSESSMENT AND PLAN: .   Complete heart block/s/p PPM implantation-recent device check revealed her device is functioning appropriately.  She will follow-up with Dr. Nelly Laurence next month.   Aortic stenosis-moderate to severe per most recent echo, she is relatively asymptomatic.  We discussed repeating echocardiogram however she prefers to defer that at this time and I think that is reasonable given her lack of symptoms.  She is also not sure she would want to do anything about it if it were to progress.  She is edentulous, does not go to the dentist, does not need SBE.  HFpEF-NYHA class I-II, euvolemic, continue Farxiga 10 mg daily, continue Lasix 80 mg daily.  Hypertension-blood pressure is controlled at 127/75, currently not on any antihypertensive agents.  Dyslipidemia-this is monitored by the PCP, she states she had lab  work last week in preparation for an upcoming appointment next week with her.         Dispo: 6 months Dr. Dulce Sellar.  Signed, Flossie Dibble, NP

## 2024-01-09 ENCOUNTER — Inpatient Hospital Stay: Payer: Medicare HMO | Attending: Hematology and Oncology | Admitting: Hematology and Oncology

## 2024-01-09 ENCOUNTER — Encounter: Payer: Self-pay | Admitting: Hematology and Oncology

## 2024-01-09 ENCOUNTER — Ambulatory Visit: Payer: Medicare HMO | Admitting: Oncology

## 2024-01-09 ENCOUNTER — Telehealth: Payer: Self-pay | Admitting: Hematology and Oncology

## 2024-01-09 VITALS — BP 112/78 | HR 82 | Temp 99.1°F | Resp 20 | Ht 63.0 in | Wt 204.4 lb

## 2024-01-09 DIAGNOSIS — Z9221 Personal history of antineoplastic chemotherapy: Secondary | ICD-10-CM | POA: Insufficient documentation

## 2024-01-09 DIAGNOSIS — I5032 Chronic diastolic (congestive) heart failure: Secondary | ICD-10-CM | POA: Diagnosis not present

## 2024-01-09 DIAGNOSIS — M85852 Other specified disorders of bone density and structure, left thigh: Secondary | ICD-10-CM | POA: Diagnosis not present

## 2024-01-09 DIAGNOSIS — Z170 Estrogen receptor positive status [ER+]: Secondary | ICD-10-CM | POA: Insufficient documentation

## 2024-01-09 DIAGNOSIS — Z96612 Presence of left artificial shoulder joint: Secondary | ICD-10-CM | POA: Insufficient documentation

## 2024-01-09 DIAGNOSIS — Z79899 Other long term (current) drug therapy: Secondary | ICD-10-CM | POA: Diagnosis not present

## 2024-01-09 DIAGNOSIS — D0511 Intraductal carcinoma in situ of right breast: Secondary | ICD-10-CM

## 2024-01-09 DIAGNOSIS — E782 Mixed hyperlipidemia: Secondary | ICD-10-CM | POA: Diagnosis not present

## 2024-01-09 DIAGNOSIS — Z96651 Presence of right artificial knee joint: Secondary | ICD-10-CM | POA: Insufficient documentation

## 2024-01-09 DIAGNOSIS — I130 Hypertensive heart and chronic kidney disease with heart failure and stage 1 through stage 4 chronic kidney disease, or unspecified chronic kidney disease: Secondary | ICD-10-CM | POA: Diagnosis not present

## 2024-01-09 DIAGNOSIS — Z1509 Genetic susceptibility to other malignant neoplasm: Secondary | ICD-10-CM | POA: Insufficient documentation

## 2024-01-09 DIAGNOSIS — Z7984 Long term (current) use of oral hypoglycemic drugs: Secondary | ICD-10-CM | POA: Diagnosis not present

## 2024-01-09 DIAGNOSIS — Z79810 Long term (current) use of selective estrogen receptor modulators (SERMs): Secondary | ICD-10-CM | POA: Diagnosis not present

## 2024-01-09 DIAGNOSIS — Z1231 Encounter for screening mammogram for malignant neoplasm of breast: Secondary | ICD-10-CM

## 2024-01-09 DIAGNOSIS — Z9223 Personal history of estrogen therapy: Secondary | ICD-10-CM | POA: Insufficient documentation

## 2024-01-09 DIAGNOSIS — Z853 Personal history of malignant neoplasm of breast: Secondary | ICD-10-CM | POA: Diagnosis not present

## 2024-01-09 DIAGNOSIS — Z9011 Acquired absence of right breast and nipple: Secondary | ICD-10-CM | POA: Insufficient documentation

## 2024-01-09 DIAGNOSIS — N183 Chronic kidney disease, stage 3 unspecified: Secondary | ICD-10-CM | POA: Diagnosis not present

## 2024-01-09 DIAGNOSIS — Z7982 Long term (current) use of aspirin: Secondary | ICD-10-CM | POA: Insufficient documentation

## 2024-01-09 DIAGNOSIS — E1122 Type 2 diabetes mellitus with diabetic chronic kidney disease: Secondary | ICD-10-CM | POA: Insufficient documentation

## 2024-01-09 MED ORDER — NYSTATIN 100000 UNIT/GM EX POWD: 1.0000 | Freq: Three times a day (TID) | CUTANEOUS | 1 refills | Status: AC

## 2024-01-09 NOTE — Telephone Encounter (Signed)
 Patient has been scheduled for follow-up visit per 01/09/24 LOS.  Pt given an appt calendar with date and time.

## 2024-01-09 NOTE — Progress Notes (Signed)
 [facility]  [address] [place],  [place]  [address] [phone]  Clinic Day:  01/09/2024  Referring physician: Abner Greenspan, MD   CHIEF COMPLAINT:  CC: Remote history of hormone receptor positive ductal carcinoma in situ of the right breast  Current Treatment: Surveillance  HISTORY OF PRESENT ILLNESS:  [name] [name] is a 83 y.o. female with a history of  stage 0 (TIS N0 M0) hormone receptor positive right breast cancer diagnosed in July 2015. Pathology revealed a 3.5 cm, grade 2, ductal carcinoma in situ.  She was treated with right mastectomy.  She was placed on chemoprevention with tamoxifen 20 mg daily, but discontinued this on her own.  When we saw her in February 2016, we recommended that the patient resume the tamoxifen.  We also recommended follow-up in 3 months, however, the patient did not schedule follow up until January of 2017 due to other medical problems and financial reasons. She stated she was taking tamoxifen daily at that time and remained without evidence of disease.  Bone density scan in October 2018 revealed a 6.1% improvement in her osteopenia with a T-score of -1.7 in the femur.  Screening mammogram in January 2019 revealed a possible mass in the left breast.  Diagnostic mammogram revealed a focally dilated duct in the subareolar area accounting for the finding on screening mammogram.  This was confirmed on ultrasound.  There was no evidence of malignancy.   Colonoscopy in June 2011 revealed multiple polyps which were removed, including 4 tubular adenomas and 2 tubulovillous adenomas.   She did not go for follow-up colonoscopy after that, so we referred her back to Dr. Jennye Boroughs. Colonoscopy in March 2017 revealed a tubular adenoma and so he recommended a repeat in 1 year.  Due to her personal and family history of malignancy and colon polyps, we recommended testing for hereditary cancer syndromes with the Myriad myRisk Hereditary  Cancer Panel test.  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  Due to this finding, colonoscopy at least every 5 years is recommended.  The patient had repeat colonoscopy in January 2020 and had removal of tubular adenomas once again.   She had a left shoulder replacement in September 2020 due to pain and humerus fracture. She has had bilateral knee surgeries. Bone density from October 2020 revealed worsening osteopenia with a T-score of -2.1 of the left femur neck, previously -1.7.  The left forearm radius was still within the normal range with a T-score of 0.8, previously 0.5.  She is taking calcium/vitamin D. She had a large soft mass of the right buttock excised by Dr. Lequita Halt.  Pathology revealed a soft tissue lipoma.  She also had a punch biopsy of a left shoulder lesion which revealed a junctional dysplastic melanocytic nevus with moderate to severe atypia.  This lesion was excised in October 2021.  Residual dysplastic nevus was seen but the margins were free of involvement.  She was hospitalized in December 2022, and was told she has congestive heart failure. Chest xray revealed cardiomegaly. Echocardiogram in December 2022 revealed normal left ventricular systolic function with an EF of 60-65%, with moderate aortic stenosis and moderate mitral stenosis.  Bone density scan in January 2023 revealed improvement in her osteopenia with a T-score of -1.5 in the left femur neck, previously -2.1.  Bone density in the left forearm was normal.  Colonoscopy in July 2023 revealed diverticular disease  left colon, no polyps.  Consideration for repeat in 2 years was recommended.  Annual left screening mammograms have remained negative.    Oncology History  Intraductal carcinoma in situ of right breast  04/13/2014 Cancer Staging   Staging form: Breast, AJCC 7th Edition - Clinical stage from 04/13/2014: Stage 0 (Tis (DCIS), N0, M0) - Signed by  Dellia Beckwith, MD on 01/03/2022 Staged by: Managing physician Diagnostic confirmation: Positive histology Specimen type: Excision Histopathologic type: Intraductal carcinoma, noninfiltrating, NOS Stage prefix: Initial diagnosis Laterality: Right Tumor size (mm): 35 Method of lymph node assessment: Clinical Histologic grade (G): G2 Lymph-vascular invasion (LVI): LVI not present (absent)/not identified Residual tumor (R): R0 - None Paget's disease: Negative Tumor grade (Scarff-Bloom-Richardson system): G2 Estrogen receptor status: Positive Progesterone receptor status: Positive HER2 status: Not assessed  Multi-gene signature risk of recurrence: Not assessed Prognostic indicators: Chemoprevention with tamoxifen x 5 years after right mast. Stage used in treatment planning: Yes National guidelines used in treatment planning: Yes Type of national guideline used in treatment planning: NCCN   04/27/2014 Initial Diagnosis   Intraductal carcinoma in situ of right breast       INTERVAL HISTORY:  [name] [name] is here today for repeat clinical assessment.  Overall, she is doing well.  She denies any changes in her left breast or right mastectomy site.  She states is unable to wear a bra that fastens in the back due to left shoulder surgery, so does not use a breast prosthesis.  She denies fevers or chills. She denies pain. Her appetite is good. Her weight has decreased 5 pounds over last year .  Prior to her visit today, she had a left screening mammogram which did not reveal any malignancy.  She sees Dr. Yetta Flock regularly and states she had labs drawn yesterday.  She states Dr. Yetta Flock increased her furosemide to 80 mg daily.   REVIEW OF SYSTEMS:  Review of Systems  Constitutional:  Negative for appetite change, chills, fatigue, fever and unexpected weight change.  HENT:   Negative for lump/mass, mouth sores and sore throat.   Respiratory:  Negative for cough and shortness of breath.    Cardiovascular:  Negative for chest pain and leg swelling.  Gastrointestinal:  Negative for abdominal pain, blood in stool, constipation, diarrhea, nausea and vomiting.  Genitourinary:  Negative for difficulty urinating, dysuria, frequency, hematuria and vaginal bleeding.   Musculoskeletal:  Negative for arthralgias, back pain, gait problem and myalgias.  Skin:  Positive for rash (under left breast, bad odor). Negative for itching.  Neurological:  Positive for numbness (neuropathy of feet). Negative for dizziness, extremity weakness, gait problem, headaches and light-headedness.  Hematological:  Negative for adenopathy. Does not bruise/bleed easily.  Psychiatric/Behavioral:  Negative for depression and sleep disturbance. The patient is not nervous/anxious.      VITALS:  Blood pressure 112/78, pulse 82, temperature 99.1 F (37.3 C), temperature source Oral, resp. rate 20, height 5\' 3"  (1.6 m), weight 204 lb 6.4 oz (92.7 kg), SpO2 96%.  Wt Readings from Last 3 Encounters:  01/09/24 204 lb 6.4 oz (92.7 kg)  01/08/24 202 lb (91.6 kg)  02/25/23 196 lb 9.6 oz (89.2 kg)    Body mass index is 36.21 kg/m.  Performance status (ECOG): 1 - Symptomatic but completely ambulatory  PHYSICAL EXAM:  Physical Exam Vitals and nursing note reviewed.  Constitutional:      General: She is not in acute distress.    Appearance: Normal appearance.  HENT:     Head:  Normocephalic and atraumatic.     Mouth/Throat:     Mouth: Mucous membranes are moist.     Pharynx: Oropharynx is clear. No oropharyngeal exudate or posterior oropharyngeal erythema.  Eyes:     General: No scleral icterus.    Extraocular Movements: Extraocular movements intact.     Conjunctiva/sclera: Conjunctivae normal.     Pupils: Pupils are equal, round, and reactive to light.  Cardiovascular:     Rate and Rhythm: Normal rate and regular rhythm.     Heart sounds: Normal heart sounds. No murmur heard.    No friction rub. No gallop.   Pulmonary:     Effort: Pulmonary effort is normal.     Breath sounds: Normal breath sounds. No wheezing, rhonchi or rales.  Chest:  Breasts:    Right: Absent.     Left: Normal. No swelling, bleeding, inverted nipple, mass, nipple discharge, skin change or tenderness.     Comments: Right mastectomy site is negative Abdominal:     General: There is no distension.     Palpations: Abdomen is soft. There is no hepatomegaly, splenomegaly or mass.     Tenderness: There is no abdominal tenderness.  Musculoskeletal:        General: Normal range of motion.     Cervical back: Normal range of motion and neck supple. No tenderness.     Right lower leg: No edema.     Left lower leg: No edema.  Lymphadenopathy:     Cervical: No cervical adenopathy.     Upper Body:     Right upper body: No supraclavicular or axillary adenopathy.     Left upper body: No supraclavicular or axillary adenopathy.     Lower Body: No right inguinal adenopathy. No left inguinal adenopathy.  Skin:    General: Skin is warm and dry.     Coloration: Skin is not jaundiced.     Findings: Rash (mild candidiasis of the left inframammary area) present.  Neurological:     Mental Status: She is alert and oriented to person, place, and time.     Cranial Nerves: No cranial nerve deficit.  Psychiatric:        Mood and Affect: Mood normal.        Behavior: Behavior normal.        Thought Content: Thought content normal.    LABS:      Latest Ref Rng & Units 11/21/2022   11:48 AM 08/02/2010    4:00 AM 08/01/2010    4:15 AM  CBC  WBC 4.0 - 10.5 K/uL 4.1  9.6  7.4   Hemoglobin 12.0 - 15.0 g/dL 40.9  81.1  91.4   Hematocrit 36.0 - 46.0 % 47.2  31.3  34.4   Platelets 150 - 400 K/uL 103  152  165       Latest Ref Rng & Units 11/22/2022   12:13 AM 11/21/2022   11:48 AM 08/02/2010    4:00 AM  CMP  Glucose 70 - 99 mg/dL 782  956  213   BUN 8 - 23 mg/dL 22  21  7    Creatinine 0.44 - 1.00 mg/dL 0.86  5.78  4.69   Sodium 135 - 145  mmol/L 137  140  135   Potassium 3.5 - 5.1 mmol/L 3.9  3.9  4.1   Chloride 98 - 111 mmol/L 105  106  100   CO2 22 - 32 mmol/L 23  25  30    Calcium 8.9 - 10.3  mg/dL 8.9  9.1  7.8     STUDIES:  DG Chest 2 View Result Date: 12/29/2023 CLINICAL DATA:  Productive cough. EXAM: CHEST - 2 VIEW COMPARISON:  11/23/2022. FINDINGS: Pulmonary vascular congestion. Left base retrocardiac subsegmental atelectasis or minimal consolidation. No pneumothorax or pleural effusion. Thoracic degenerative changes. Calcified aorta. Left shoulder prosthesis. Left-sided pacer. IMPRESSION: Vascular congestion. Left base retrocardiac atelectasis or consolidation. Electronically Signed   By: Layla Maw M.D.   On: 12/29/2023 21:06     Exam(s): [id number] MAM/MAM DIGITAL TOMO SCREENING L  EXAM:  MAM DIGITAL TOMO SCREENING L  DATE: 01/05/2024  CLINICAL HISTORY:  F, Age 53 y/o , Z12.31  Previous right mastectomy  TECHNIQUE:  Left digital breast tomosynthesis with 2D and 3D images. Computer aided detection.  COMPARISON:  Prior exam(s) dated 01/03/2023.  FINDINGS:  TISSUE DENSITY: The breast tissue is composed of scattered area of fibroglandular density.  Left breast Mammographic Findings:  No significant masses, calcifications or other abnormalities are identified.  Stable scattered vascular, punctate and lucent centered calcifications.  IMPRESSION:  Left Breast: BIRADS 2 BENIGN FINDING.  OVERALL FINAL ASSESSMENT: BIRADS 2 BENIGN FINDING  RECOMMENDATION:  Routine annual follow-up in 1 Year   HISTORY:   Past Medical History:  Diagnosis Date   Aortic valve stenosis, moderate    Arthritis    Asthma    Benign hypertension with CKD (chronic kidney disease) stage III (HCC)    COPD (chronic obstructive pulmonary disease) (HCC)    Diabetic peripheral neuropathy (HCC)    Diastolic congestive heart failure (HCC)    DM type 2 with diabetic mixed hyperlipidemia (HCC)    Hearing loss    History of breast cancer     HLD (hyperlipidemia)    HTN (hypertension)    Hypothyroid    Intraductal carcinoma in situ of right breast 04/27/2014   Mitral valve stenosis    Morbid obesity (HCC)    Muscle pain    Osteopenia after menopause 07/11/2017   Polyposis associated with heterozygous mutation in MUTYH gene 01/03/2022   Sinus problem    Swelling    Tubular adenoma of colon 01/03/2022   Multiple, MUTYH mutation    Past Surgical History:  Procedure Laterality Date   BACK SURGERY  02/14/2014   MASTECTOMY Right 2015   PACEMAKER IMPLANT N/A 11/22/2022   Procedure: PACEMAKER IMPLANT;  Surgeon: Maurice Small, MD;  Location: [facility] INVASIVE CV LAB;  Service: Cardiovascular;  Laterality: N/A;   PARTIAL HYSTERECTOMY  1980   TONSILLECTOMY     TOTAL KNEE ARTHROPLASTY Right 02/07/2011    Family History  Problem Relation Age of Onset   Diabetes Mellitus I Mother    Heart disease Mother    Breast cancer Sister    Alzheimer's disease Other     Social History:  reports that she has never smoked. She has never been exposed to tobacco smoke. She has never used smokeless tobacco. She reports current alcohol use. She reports that she does not use drugs.The patient is alone today.  Allergies:  Allergies  Allergen Reactions   Darvon [Propoxyphene] Swelling   Penicillins Swelling    Current Medications: Current Outpatient Medications  Medication Sig Dispense Refill   Acetaminophen (TYLENOL 8 HOUR ARTHRITIS PAIN PO) Take 1-2 tablets by mouth as needed for pain.     albuterol (VENTOLIN HFA) 108 (90 Base) MCG/ACT inhaler Inhale 2 puffs into the lungs every 6 (six) hours as needed for wheezing or shortness of breath.  aspirin 81 MG EC tablet Take 81 mg by mouth daily. Swallow whole.     calcium carbonate (OSCAL) 1500 (600 Ca) MG TABS tablet Take 1 tablet by mouth daily with breakfast.     FARXIGA 10 MG TABS tablet Take 10 mg by mouth daily.     fluticasone (FLONASE) 50 MCG/ACT nasal spray Place 1-2 sprays into both  nostrils as needed for allergies or rhinitis.     Fluticasone-Umeclidin-Vilant (TRELEGY ELLIPTA) 100-62.5-25 MCG/ACT AEPB Inhale 1 puff into the lungs daily.     furosemide (LASIX) 40 MG tablet Take 80 mg by mouth daily.     gabapentin (NEURONTIN) 100 MG capsule Take 100 mg by mouth 2 (two) times daily.     hydrocortisone cream 1 % Apply 1 application  topically daily as needed for itching.     levothyroxine (SYNTHROID) 100 MCG tablet Take 100 mcg by mouth daily before breakfast.     Magnesium 250 MG TABS Take 250 mg by mouth daily.     potassium chloride (KLOR-CON) 10 MEQ tablet Take 1 tablet (10 mEq total) by mouth daily. 90 tablet 2   rosuvastatin (CRESTOR) 10 MG tablet Take 1 tablet (10 mg total) by mouth daily. 90 tablet 3   No current facility-administered medications for this visit.     ASSESSMENT & PLAN:   Assessment & Plan: [name] [name] is a 83 y.o. female     Remote history of hormone receptor positive ductal carcinoma in situ treated with mastectomy and chemoprevention with tamoxifen for 5 years.  She remains without evidence of recurrence.  I will plan to see her back in 1 year in long-term survivorship clinic.  Monoallelic mutation in the MUTYH gene, which is associated with an increased risk of polyps and colon cancer.  She has had multiple adenomatous polyps. Her last colonoscopy June 2023 was clear.  Dr. Jennye Boroughs recommended considering repeat colonoscopy in 2 years.  She will contact him as recommended.   3.  Osteopenia, with improvement of the left femur on bone density scan in January 2023.  I will leave it up to Dr. Yetta Flock whether to continue bone density scans.  4.  Candidiasis of the left inframammary area.  I will prescribe nystatin powder.    The patient understands the plans discussed today and is in agreement with them.  She knows to contact our office if she develops concerns prior to her next appointment.     I provided 30 minutes of  face-to-face time during this encounter and > 50% was spent counseling as documented under my assessment and plan.    Adah Perl, PA-C   CANCER CENTER [facility] [place] - A DEPT OF [facility] [address] [place] [address] Dept: [phone] Dept Fax: [phone]   Orders Placed This Encounter  Procedures   MM Digital Screening Unilat L    Standing Status:   Future    Expected Date:   01/05/2025    Expiration Date:   01/08/2025    Scheduling Instructions:     RH    Reason for Exam (SYMPTOM  OR DIAGNOSIS REQUIRED):   screening for breast cancer, h/o of right breast cancer    Preferred imaging location?:   External

## 2024-01-12 ENCOUNTER — Encounter: Payer: Self-pay | Admitting: Oncology

## 2024-01-15 DIAGNOSIS — N3946 Mixed incontinence: Secondary | ICD-10-CM | POA: Diagnosis not present

## 2024-01-15 DIAGNOSIS — Z6837 Body mass index (BMI) 37.0-37.9, adult: Secondary | ICD-10-CM | POA: Diagnosis not present

## 2024-01-15 DIAGNOSIS — I509 Heart failure, unspecified: Secondary | ICD-10-CM | POA: Diagnosis not present

## 2024-01-20 ENCOUNTER — Telehealth: Payer: Self-pay

## 2024-01-20 DIAGNOSIS — N183 Chronic kidney disease, stage 3 unspecified: Secondary | ICD-10-CM

## 2024-01-20 DIAGNOSIS — I129 Hypertensive chronic kidney disease with stage 1 through stage 4 chronic kidney disease, or unspecified chronic kidney disease: Secondary | ICD-10-CM

## 2024-01-20 DIAGNOSIS — E1169 Type 2 diabetes mellitus with other specified complication: Secondary | ICD-10-CM

## 2024-01-22 DIAGNOSIS — Z6837 Body mass index (BMI) 37.0-37.9, adult: Secondary | ICD-10-CM | POA: Diagnosis not present

## 2024-01-22 DIAGNOSIS — L255 Unspecified contact dermatitis due to plants, except food: Secondary | ICD-10-CM | POA: Diagnosis not present

## 2024-01-22 DIAGNOSIS — I509 Heart failure, unspecified: Secondary | ICD-10-CM | POA: Diagnosis not present

## 2024-01-23 DIAGNOSIS — J449 Chronic obstructive pulmonary disease, unspecified: Secondary | ICD-10-CM | POA: Diagnosis not present

## 2024-01-26 ENCOUNTER — Telehealth: Payer: Self-pay

## 2024-01-26 NOTE — Progress Notes (Signed)
 Complex Care Management Note  Care Guide Note 01/26/2024 Name: [name] [name] MRN: [id number] DOB: 29-May-1941  [name] [name] [name] is a 83 y.o. year old female who sees Lonie Roa, MD for primary care. I reached out to [employer] by phone today to offer complex care management services.  Ms. [name] was given information about Complex Care Management services today including:   The Complex Care Management services include support from the care team which includes your Nurse Care Manager, Clinical Social Worker, or Pharmacist.  The Complex Care Management team is here to help remove barriers to the health concerns and goals most important to you. Complex Care Management services are voluntary, and the patient may decline or stop services at any time by request to their care team member.   Complex Care Management Consent Status: Patient agreed to services and verbal consent obtained.   Follow up plan:  Telephone appointment with complex care management team member scheduled for:  01/28/24 & 02/10/24.   Encounter Outcome:  Patient Scheduled  [facility]  [facility], [facility] Health Care Management Assistant Direct Dial: [phone]  Fax: [phone]

## 2024-01-28 ENCOUNTER — Other Ambulatory Visit: Payer: Self-pay

## 2024-01-28 DIAGNOSIS — J449 Chronic obstructive pulmonary disease, unspecified: Secondary | ICD-10-CM | POA: Diagnosis not present

## 2024-01-28 DIAGNOSIS — I10 Essential (primary) hypertension: Secondary | ICD-10-CM | POA: Diagnosis not present

## 2024-01-28 NOTE — Patient Outreach (Signed)
 Complex Care Management   Visit Note  01/28/2024  Name:  [name] [name] MRN: [id number] DOB: 09-15-1941  Situation: Referral received for Complex Care Management related to  Rollator and home repairs.  I obtained verbal consent from Patient.  Visit completed with patient  on the phone  Background:   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   Mitral valve stenosis    Morbid obesity (HCC)    Muscle pain    Osteopenia after menopause 07/11/2017   Polyposis associated with heterozygous mutation in MUTYH gene 01/03/2022   Sinus problem    Swelling    Tubular adenoma of colon 01/03/2022   Multiple, MUTYH mutation    Assessment: Patient reports she does not own the home but her son does.  She has been told she can't get help with home repairs because of the ownership. The bathroom floor is damaged.  Patient has another bathroom but it is not close to her bedroom.  Patient declines suggestion for a bedside commode. Patient has made some repairs to access the bathroom and is saving for repairs.  Patient receives extra assistance from insurance to apply toward diapers which cost about $100 monthly.  SW t/c [employer] and is directed to [employer] for the rollator.  Patient will obtain orders when she speaks to the provider 02/05/24.  Patient request to end the call due to Oxygen  company arriving with delivery.  Assessment will be completed at next visit.   Recommendation:   DME  requests:  walker  Follow Up Plan:   Telephone follow up appointment date/time:  02/06/24 at 1pm.  Dallis Dues, BSW [place]  [facility], [facility] Social Worker Direct Dial: [phone]  Fax: [phone] Website: [email or web address]

## 2024-01-28 NOTE — Patient Instructions (Signed)
 Visit Information  Thank you for taking time to visit with me today. Please don't hesitate to contact me if I can be of assistance to you before our next scheduled appointment.  Our next appointment is by telephone on 02/06/24 at 1pm Please call the care guide team at [phone] if you need to cancel or reschedule your appointment.   Following is a copy of your care plan:   Goals Addressed             This Visit's Progress    VBCI Social Work Care Plan       Problems:   Home needs repair and patient wants rollator  CSW Clinical Goal(s):   Over the next 7 days the Patient will attend all scheduled medical appointments as evidenced by patient report and care team review of appointment completion in electronic MEDICAL RECORD NUMBERto request orders for rollator .  Interventions:  SW t/c [facility] and rollator is covered with copay.  Adult diapers are not covered, but informed patient receives Home/Food card to purchase diapers.  [employer] will email patient information on [employer] for the rollator.   Patient Goals/Self-Care Activities:  Coordinate with provider to assist with orders for scooter.  Plan:   Follow up with provider re: scooter during visit on 02/05/24.        Please call 911 if you are experiencing a Mental Health or Behavioral Health Crisis or need someone to talk to.  Patient verbalizes understanding of instructions and care plan provided today and agrees to view in MyChart. Active MyChart status and patient understanding of how to access instructions and care plan via MyChart confirmed with patient.     [name] [name], BSW [place]  [facility], [facility] Social Worker Direct Dial: [phone]  Fax: [phone] Website: [email or web address]

## 2024-01-29 DIAGNOSIS — D692 Other nonthrombocytopenic purpura: Secondary | ICD-10-CM | POA: Diagnosis not present

## 2024-01-29 DIAGNOSIS — I10 Essential (primary) hypertension: Secondary | ICD-10-CM | POA: Diagnosis not present

## 2024-01-29 DIAGNOSIS — I509 Heart failure, unspecified: Secondary | ICD-10-CM | POA: Diagnosis not present

## 2024-02-05 ENCOUNTER — Other Ambulatory Visit (HOSPITAL_BASED_OUTPATIENT_CLINIC_OR_DEPARTMENT_OTHER): Payer: Self-pay | Admitting: Family Medicine

## 2024-02-05 DIAGNOSIS — I509 Heart failure, unspecified: Secondary | ICD-10-CM | POA: Diagnosis not present

## 2024-02-05 DIAGNOSIS — Z6834 Body mass index (BMI) 34.0-34.9, adult: Secondary | ICD-10-CM | POA: Diagnosis not present

## 2024-02-05 DIAGNOSIS — E2839 Other primary ovarian failure: Secondary | ICD-10-CM

## 2024-02-05 DIAGNOSIS — E782 Mixed hyperlipidemia: Secondary | ICD-10-CM | POA: Diagnosis not present

## 2024-02-05 DIAGNOSIS — E1169 Type 2 diabetes mellitus with other specified complication: Secondary | ICD-10-CM | POA: Diagnosis not present

## 2024-02-06 ENCOUNTER — Other Ambulatory Visit: Payer: Self-pay

## 2024-02-06 NOTE — Patient Instructions (Signed)
 Visit Information  Thank you for taking time to visit with me today. Please don't hesitate to contact me if I can be of assistance to you before our next scheduled appointment.  Patient has met all care management goals. Care Management case will be closed. Patient has been provided contact information should new needs arise.   Please call the care guide team at [phone] if you need to cancel, schedule, or reschedule an appointment.   Please call 911 if you are experiencing a Mental Health or Behavioral Health Crisis or need someone to talk to.  [name] [name], BSW [place]  [facility], [facility] Social Worker Direct Dial: [phone]  Fax: [phone] Website: [email or web address]

## 2024-02-06 NOTE — Patient Outreach (Signed)
 Complex Care Management   Visit Note  02/06/2024  Name:  [name] [name] MRN: [id number] DOB: 12-28-1940  Situation: Referral received for Complex Care Management related to Rollator I obtained verbal consent from Patient.  Visit completed with patient  on the phone  Background:   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   Mitral valve stenosis    Morbid obesity (HCC)    Muscle pain    Osteopenia after menopause 07/11/2017   Polyposis associated with heterozygous mutation in MUTYH gene 01/03/2022   Sinus problem    Swelling    Tubular adenoma of colon 01/03/2022   Multiple, MUTYH mutation    Assessment:  Patient reports she attended her doctor visit and requested the provider to order a rollator.  Patient will speak to provider regarding options for colonoscopy.  Patient will continue to save money to finish bathroom floor repair.  SW provided information on DSS LIEAP assistance for energy bill.  SDOH Interventions    Flowsheet Row Patient Outreach Telephone from 02/06/2024 in [place] [facility] DEPARTMENT Care Coordination from 09/26/2023 in [facility] Community Care Coordination  SDOH Interventions    Food Insecurity Interventions Intervention Not Indicated Intervention Not Indicated  Housing Interventions Intervention Not Indicated  [Lives in home owned by son.] Intervention Not Indicated  Transportation Interventions Intervention Not Indicated, Patient  Resources (Friends/Family)  [has a car but son's drive] Intervention Not Indicated, Other (Comment)  [Shares a car with her son]  Utilities Interventions Intervention Not Indicated Intervention Not Indicated  Financial Strain Interventions Intervention Not Indicated --        Recommendation:   Patient reports she will speak to provider regarding options for colonoscopy due to issues with swallowing.  Follow Up Plan:   Patient does not request follow up.  Dallis Dues, BSW [place]  [facility], [facility] Social Worker Direct Dial: [phone]  Fax: [phone] Website: [email or web address]

## 2024-02-09 ENCOUNTER — Ambulatory Visit (HOSPITAL_BASED_OUTPATIENT_CLINIC_OR_DEPARTMENT_OTHER)
Admission: RE | Admit: 2024-02-09 | Discharge: 2024-02-09 | Discharge status: Home / Self Care | Source: Ambulatory Visit | Attending: Family Medicine | Admitting: Family Medicine

## 2024-02-09 DIAGNOSIS — E2839 Other primary ovarian failure: Secondary | ICD-10-CM

## 2024-02-10 ENCOUNTER — Other Ambulatory Visit: Payer: Self-pay | Admitting: *Deleted

## 2024-02-10 ENCOUNTER — Telehealth

## 2024-02-10 NOTE — Patient Instructions (Incomplete)
 Visit Information  Thank you for taking time to visit with me today. Please don't hesitate to contact me if I can be of assistance to you before our next scheduled appointment.  Our next appointment is {NEXTVISITTYPE:26617} on *** at *** Please call the care guide team at [phone] if you need to cancel or reschedule your appointment.   Following is a copy of your care plan:   Goals Addressed   None     Please {MHBHCRISISCONTACTS:26616} if you are experiencing a Mental Health or Behavioral Health Crisis or need someone to talk to.  {CCM PT INSTRUCTIONS:22237}  SIGNATURE***

## 2024-02-11 ENCOUNTER — Encounter: Payer: Self-pay | Admitting: *Deleted

## 2024-02-11 ENCOUNTER — Ambulatory Visit: Admitting: Cardiology

## 2024-02-11 ENCOUNTER — Other Ambulatory Visit: Payer: Self-pay

## 2024-02-12 ENCOUNTER — Ambulatory Visit: Admitting: Cardiology

## 2024-02-13 ENCOUNTER — Encounter: Payer: Self-pay | Admitting: *Deleted

## 2024-02-13 NOTE — Patient Outreach (Signed)
 Complex Care Management   Visit Note  02/13/2024 Late entry for 02/10/24  Name:  [name] [name] MRN: [id number] DOB: 1940-12-08  Situation: Referral received for Complex Care Management related to Heart Failure and COPD I obtained verbal consent from Patient.  Visit completed with Chrystine Crate  on the phone  Background:   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   Mitral valve stenosis    Morbid obesity (HCC)    Muscle pain    Osteopenia after menopause 07/11/2017   Polyposis associated with heterozygous mutation in MUTYH gene 01/03/2022   Sinus problem    Swelling    Tubular adenoma of colon 01/03/2022   Multiple, MUTYH mutation    Assessment: Patient Reported Symptoms:  Cognitive Cognitive Status: Alert and oriented to person, place, and time, Insightful and able to interpret abstract concepts, Normal speech and language skills   Health Maintenance Behaviors: Annual physical exam, Sleep adequate  Neurological Neurological Review of Symptoms: Hearing changes, Other: [place] Neurological Symptoms/Conditions [RPT]: diabetic neuropathy Neurological Self-Management Outcome: 4 (good)  HEENT HEENT Symptoms Reported: Change or loss of hearing, Other: Other HEENT Symptoms/Conditions: postnasal drainage HEENT Conditions: Ear problem(s) HEENT Management Strategies: Adequate rest, Medication therapy, Routine screening, Fluid modification, Medical device HEENT Self-Management Outcome: 3  (uncertain) Ear problem(s)  Cardiovascular Cardiovascular Symptoms Reported: Swelling in legs or feet Does patient have uncontrolled Hypertension?: No Cardiovascular Conditions: Dysrhythmia, Heart failure, High blood cholesterol, Hypertension Cardiovascular Management Strategies: Adequate rest, Medication therapy, Routine screening Weight: 195 lb (88.5 kg) Cardiovascular Self-Management Outcome: 4 (good) Cardiovascular Comment: Voices concern with increased swelling-up and down in the last 2 weeks. Pcp requesting to see her once a week and her weight is being monitored virtually at pcp office  Respiratory Respiratory Symptoms Reported: Other:, Productive cough Other Respiratory Symptoms: Has increased mucus in her throat, post nasal drainage that makes it hard for patient to speech, needing to clear her throat frequently Additional Respiratory Details: has sinus problems Respiratory Conditions: Asthma, COPD Respiratory Self-Management Outcome: 3 (uncertain) Respiratory Comment: sinus problems  Endocrine Is patient diabetic?: Yes Is patient checking blood sugars at home?: No Endocrine Conditions: Thyroid  disorder, Diabetes  Gastrointestinal Gastrointestinal Symptoms Reported: No symptoms reported Gastrointestinal Conditions: Other Other Gastrointestinal Conditions: hx colon cancer Gastrointestinal Self-Management Outcome: 4 (good) Nutrition Risk Screen (CP): No indicators present  Genitourinary Genitourinary Symptoms Reported: No symptoms reported Genitourinary Management Strategies: Adequate rest, Diet modification, Fluid modification Genitourinary Self-Management Outcome: 4 (good)  Integumentary Integumentary Symptoms Reported: No symptoms reported Skin Management Strategies: Routine screening Skin Self-Management Outcome: 4 (good)  Musculoskeletal Musculoskelatal Symptoms Reviewed: Difficulty walking Musculoskeletal Conditions: Osteoarthritis, Osteopenia, Other Other Musculoskeletal  Conditions: swelling and muscle pain history Musculoskeletal Management Strategies: Adequate rest, Routine screening Musculoskeletal Self-Management Outcome: 3 (uncertain) Falls in the past year?: No Number of falls in past year: 1 or less Was there an injury with Fall?: No Fall Risk Category Calculator: 0 Patient Fall  Risk Level: Low Fall Risk Patient at Risk for Falls Due to: Impaired balance/gait, Impaired mobility Fall risk Follow up: Falls evaluation completed  Psychosocial Psychosocial Symptoms Reported: No symptoms reported Additional Psychological Details: Reports the loss of her spouse in 44 from CHF Behavioral Management Strategies: Coping strategies, Support system Behavioral Health Self-Management Outcome: 4 (good) Behavioral Health Comment: Voices interest in "getting a partner" & increasing her socialization, going to the senior center Major Change/Loss/Stressor/Fears (CP): Medical condition, self Behaviors When Feeling Stressed/Fearful: She reports "my seventies were rough years" Techniques to [place] with Loss/Stress/Change: Diversional activities Quality of Family Relationships: helpful, supportive Do you feel physically threatened by others?: No      02/11/2024   10:20 AM  Depression screen PHQ 2/9  Decreased Interest 1  Down, Depressed, Hopeless 0  PHQ - 2 Score 1    There were no vitals filed for this visit.  Medications Reviewed Today     Reviewed by Arlyce Berger, RN (Registered Nurse) on 02/11/24 at 1017  Med List Status: <None>   Medication Order Taking? Sig Documenting Provider Last Dose Status Informant  Acetaminophen  (TYLENOL  8 HOUR ARTHRITIS PAIN PO) [id number]  Take 1-2 tablets by mouth as needed for pain. [provider]  Active Self  albuterol  (VENTOLIN  HFA) 108 (90 Base) MCG/ACT inhaler [id number]  Inhale 2 puffs into the lungs every 6 (six) hours as needed for wheezing or shortness of breath. [provider]  Active Self   aspirin 81 MG EC tablet [id number]  Take 81 mg by mouth daily. Swallow whole. [provider]  Active Self  BOOSTRIX 5-2.5-18.5 LF-MCG/0.5 injection [id number] Yes  [provider]  Active   calcium  carbonate (OSCAL) 1500 (600 Ca) MG TABS tablet [id number]  Take 1 tablet by mouth daily with breakfast. [provider]  Active Self  FARXIGA  10 MG TABS tablet [id number]  Take 10 mg by mouth daily. [provider]  Active Self  fluticasone  (FLONASE ) 50 MCG/ACT nasal spray [id number]  Place 1-2 sprays into both nostrils as needed for allergies or rhinitis. [provider]  Active Self  Fluticasone -Umeclidin-Vilant (TRELEGY ELLIPTA) 100-62.5-25 MCG/ACT AEPB [id number]  Inhale 1 puff into the lungs daily. [provider]  Active Self  furosemide  (LASIX ) 40 MG tablet [id number] Yes Take 80 mg by mouth daily. [provider] Taking Active   gabapentin  (NEURONTIN ) 100 MG capsule [id number]  Take 100 mg by mouth 2 (two) times daily. [provider]  Active Self  gabapentin  (NEURONTIN ) 300 MG capsule [id number]  Take 300 mg by mouth 2 (two) times daily. [provider]  Active   hydrocortisone cream 1 % [id number]  Apply 1 application  topically daily as needed for itching. [provider]  Active Self  levothyroxine  (SYNTHROID ) 100 MCG tablet [id number]  Take 100 mcg by mouth daily before breakfast. [provider]  Active   Magnesium 250 MG TABS [id number]  Take 250 mg by mouth daily. [provider]  Active Self  MAGNESIUM COMPLEX HIGH POTENCY 250 MG CAPS [id number] Yes  [provider]  Active   Magnesium Oxide -Mg Supplement 250 MG TABS [id number] Yes  [provider]  Active   metolazone (ZAROXOLYN) 2.5 MG tablet [id number] Yes Take 2.5 mg by mouth daily. [provider]  Active   nystatin  (MYCOSTATIN /NYSTOP ) powder [id number]  Apply 1 Application topically 3 (three) times daily. Mosher,  Alphonso Jean, PA-C  Active   potassium chloride  (KLOR-CON ) 10 MEQ tablet [id number]  Take 1  tablet (10 mEq total) by mouth daily. Hassan Links, MD  Active Self  potassium chloride  (MICRO-K ) 10 MEQ CR capsule [id number] Yes Take 10 mEq by mouth daily. [provider]  Active   rosuvastatin  (CRESTOR ) 10 MG tablet [id number]  Take 1 tablet (10 mg total) by mouth daily. Hassan Links, MD  Active   triamcinolone cream (KENALOG) 0.1 % [id number] Yes Apply 1 Application topically 2 (two) times daily. [provider]  Active             Recommendation:   PCP Follow-up Specialty provider follow-up cardiology on 02/24/24, ?pulmonology & gastroenterology   Speak with pulmonologist about increased post nasal drainage and new treatment action plan when return from vacation   Follow Up Plan:   Telephone follow up appointment date/time:  02/25/24 2 pm  Rhayne Chatwin L. Mcarthur Speedy, RN, BSN, CCM [place]  [facility], [facility] Health RN Care Manager Direct Dial: [phone]  Fax: [phone]

## 2024-02-16 ENCOUNTER — Telehealth: Payer: Self-pay | Admitting: *Deleted

## 2024-02-16 NOTE — Patient Outreach (Signed)
 RN CM outreach to [employer] senior adults program & services [phone] and left a message for Ginger after speaking with the answering staff. Inquired and voiced patient's interest of working with someone in the computer lab/IT. Left message inquiring about the process to get this started. Left patient & RN CCM's contact numbers for a return call   Draven Natter L. Mcarthur Speedy, RN, BSN, CCM [place]  [facility], [facility] Health RN Care Manager Direct Dial: [phone]  Fax: [phone]

## 2024-02-24 ENCOUNTER — Encounter: Payer: Self-pay | Admitting: Cardiovascular Disease

## 2024-02-24 ENCOUNTER — Ambulatory Visit: Attending: Cardiovascular Disease | Admitting: Cardiovascular Disease

## 2024-02-24 VITALS — BP 115/72 | HR 66 | Ht 63.0 in | Wt 195.4 lb

## 2024-02-24 DIAGNOSIS — I442 Atrioventricular block, complete: Secondary | ICD-10-CM | POA: Diagnosis not present

## 2024-02-24 LAB — CUP PACEART INCLINIC DEVICE CHECK
Battery Remaining Longevity: 135 mo
Battery Voltage: 3.04 V
Brady Statistic AP VP Percent: 24.22 %
Brady Statistic AP VS Percent: 0 %
Brady Statistic AS VP Percent: 75.76 %
Brady Statistic AS VS Percent: 0.03 %
Brady Statistic RA Percent Paced: 24.19 %
Brady Statistic RV Percent Paced: 99.97 %
Date Time Interrogation Session: 20250527140620
Implantable Lead Connection Status: 753985
Implantable Lead Connection Status: 753985
Implantable Lead Implant Date: 20240223
Implantable Lead Implant Date: 20240223
Implantable Lead Location: 753859
Implantable Lead Location: 753860
Implantable Lead Model: 3830
Implantable Lead Model: 5076
Implantable Pulse Generator Implant Date: 20240223
Lead Channel Impedance Value: 361 Ohm
Lead Channel Impedance Value: 418 Ohm
Lead Channel Impedance Value: 551 Ohm
Lead Channel Impedance Value: 570 Ohm
Lead Channel Pacing Threshold Amplitude: 0.875 V
Lead Channel Pacing Threshold Amplitude: 1 V
Lead Channel Pacing Threshold Pulse Width: 0.4 ms
Lead Channel Pacing Threshold Pulse Width: 0.4 ms
Lead Channel Sensing Intrinsic Amplitude: 2.625 mV
Lead Channel Sensing Intrinsic Amplitude: 2.625 mV
Lead Channel Sensing Intrinsic Amplitude: 22 mV
Lead Channel Setting Pacing Amplitude: 2 V
Lead Channel Setting Pacing Amplitude: 2 V
Lead Channel Setting Pacing Pulse Width: 0.4 ms
Lead Channel Setting Sensing Sensitivity: 1.2 mV
Zone Setting Status: 755011
Zone Setting Status: 755011

## 2024-02-24 NOTE — Patient Instructions (Signed)

## 2024-02-24 NOTE — Progress Notes (Signed)
  Electrophysiology Office Note:    Date:  02/24/2024   ID:  [name] [name], [name] [name] Apr 28, 1941, MRN [id number]  PCP:  Lonie Roa, MD   [facility] Providers Cardiologist:  Zoe Hinds, MD Cardiology APP:  Terrance Ferretti, NP  Electrophysiologist:  Efraim Grange, MD     Referring MD: Lonie Roa, MD   History of Present Illness:    [name] [name] is a 83 y.o. female with a hx listed below, significant for complete heart block, mild to moderate mitral stenosis and moderate to severe aortic stenosis, chronic CHF with preserved EF who presents for device follow-up.  Patient was admitted in February 2024 with complete heart block.  This occurred in the setting of pre-existing conduction disease as well as a history of moderate to severe valvular disease.  A Medtronic dual-chamber pacemaker was placed without complications.  she has no device related complaints -- no new tenderness, drainage, redness.    EKGs/Labs/Other Studies Reviewed Today:    Echocardiogram:  TTE 11/21/22 EF 65%. LA severely dilated.  Severe calcification of aortic valve with moderate to severe aortic valve stenosis.   Monitors:   Stress testing:   Advanced imaging:   Cardiac catherization   EKG:  Last EKG results: today -- A-V paced   Recent Labs: No results found for requested labs within last 365 days.     Physical Exam:    VS:  BP 115/72 (BP Location: Left Arm, Patient Position: Sitting, Cuff Size: Large)   Pulse 66   Ht 5\' 3"  (1.6 m)   Wt 195 lb 6.4 oz (88.6 kg)   SpO2 98%   BMI 34.61 kg/m     Wt Readings from Last 3 Encounters:  02/24/24 195 lb 6.4 oz (88.6 kg)  02/11/24 195 lb (88.5 kg)  01/09/24 204 lb 6.4 oz (92.7 kg)     GEN:  Well nourished, well developed in no acute distress CARDIAC: RRR, no murmurs, rubs, gallops The device site is normal -- no tenderness, edema, drainage, redness, threatened erosion. RESPIRATORY:  Normal  work of breathing MUSCULOSKELETAL: no edema    ASSESSMENT & PLAN:    History of complete heart block Dual chamber pacemaker -- normal function  Medtronic dual chamber pacemaker She is device dependent today See PaceArt for details  CHFpEF Continue furosemide  40mg          Medication Adjustments/Labs and Tests Ordered: Current medicines are reviewed at length with the patient today.  Concerns regarding medicines are outlined above.  Orders Placed This Encounter  Procedures   EKG 12-Lead   No orders of the defined types were placed in this encounter.    Signed, Efraim Grange, MD  02/24/2024 1:58 PM    [facility]

## 2024-02-25 ENCOUNTER — Other Ambulatory Visit: Payer: Self-pay | Admitting: *Deleted

## 2024-02-25 ENCOUNTER — Other Ambulatory Visit: Payer: Self-pay

## 2024-02-25 ENCOUNTER — Encounter: Payer: Self-pay | Admitting: *Deleted

## 2024-02-25 NOTE — Patient Outreach (Signed)
 Complex Care Management   Visit Note  02/25/2024  Name:  [name] [name] MRN: [id number] DOB: 03-Dec-1940  Situation: Referral received for Complex Care Management related to Heart Failure and COPD I obtained verbal consent from Patient.  Visit completed with Delle Ferdinand  on the phone  Background:   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   Mitral valve stenosis    Morbid obesity (HCC)    Muscle pain    Osteopenia after menopause 07/11/2017   Polyposis associated with heterozygous mutation in MUTYH gene 01/03/2022   Sinus problem    Swelling    Tubular adenoma of colon 01/03/2022   Multiple, MUTYH mutation    Assessment: Patient Reported Symptoms:  Cognitive Cognitive Status: Able to follow simple commands, Alert and oriented to person, place, and time, Normal speech and language skills, Insightful and able to interpret abstract concepts Cognitive/Intellectual Conditions Management [RPT]: None reported or documented in medical history or problem list   Health Maintenance Behaviors: Annual physical exam, Sleep adequate Healing Pattern: Average Health Facilitated by: Healthy diet, Prayer/meditation, Rest, Stress management  Neurological Neurological Review of Symptoms: Not assessed    HEENT HEENT Symptoms Reported: Sore throat, Swelling arount throat, Other: Other HEENT Symptoms/Conditions: confirmed 3 occurences in last month of increase mucus in back of throat, difficulty  swallowing solids & liquids HEENT Conditions: Ear problem(s), Pain Pain Location:  (assisted to get a March 01 2024 appointment with GI Dr Monico Anna) HEENT Management Strategies: Adequate rest, Diet modification, Fluid modification, Medication therapy, Routine screening HEENT Self-Management Outcome: 3 (uncertain) Ear problem(s), Pain  Cardiovascular Cardiovascular Symptoms Reported: Swelling in legs or feet Does patient have uncontrolled Hypertension?: No Cardiovascular Conditions: Dysrhythmia, Heart failure, High blood cholesterol, Hypertension Cardiovascular Management Strategies: Adequate rest, Medication therapy, Routine screening Cardiovascular Self-Management Outcome: 4 (good)  Respiratory Respiratory Symptoms Reported: Other:, Productive cough Other Respiratory Symptoms: has increased mucus in her throat post nasal drainage that makes it hard for patient to speech, needing to clear her throat frequently Additional Respiratory Details: has sinus problems Respiratory Conditions: Asthma, COPD Respiratory Self-Management Outcome: 3 (uncertain) Respiratory Comment: sinus problems  Endocrine Patient reports the following symptoms related to hypoglycemia or hyperglycemia : No symptoms reported Is patient diabetic?: Yes Is patient checking blood sugars at home?: No    Gastrointestinal Gastrointestinal Symptoms Reported: Constipation Gastrointestinal Conditions: Other Other Gastrointestinal Conditions: hx of colon  cancer Gastrointestinal Management Strategies: Adequate rest, Diet modification, Fluid modification Gastrointestinal Self-Management Outcome: 4 (good) Nutrition Risk Screen (CP): No indicators present  Genitourinary   Genitourinary Comment: 0940 March 01 2024 appointment with Dr Monico Anna GI  Integumentary Integumentary Symptoms Reported: No symptoms reported Skin Management Strategies: Routine screening Skin Self-Management Outcome: 4 (good)  Musculoskeletal Musculoskelatal  Symptoms Reviewed: Not assessed        Psychosocial Psychosocial Symptoms Reported: No symptoms reported Additional Psychological Details: she is pending going out socially during the upcoming weekend Behavioral Management  Strategies: Activity, Adequate rest, Community resources, Personnel officer Health Self-Management Outcome: 4 (good) Major Change/Loss/Stressor/Fears (CP): Medical condition, self Techniques to Cope with Loss/Stress/Change: Diversional activities Quality of Family Relationships: helpful, supportive Do you feel physically threatened by others?: No      02/25/2024    2:59 PM  Depression screen PHQ 2/9  Decreased Interest 1  Down, Depressed, Hopeless 0  PHQ - 2 Score 1    There were no vitals filed for this visit.  Medications Reviewed Today   Medications were not reviewed in this encounter     Recommendation:   PCP Follow-up  Follow Up Plan:   Face to Face appointment date/time: 03/24/24 2:30 pm  Jullie Oiler L. Mcarthur Speedy, RN, BSN, CCM [place]  [facility], [facility] RN Care Manager Direct Dial: [phone]  Fax: [phone]

## 2024-02-26 ENCOUNTER — Telehealth: Payer: Self-pay | Admitting: Cardiology

## 2024-02-26 NOTE — Telephone Encounter (Signed)
 [place] with [facility] called in asking for pt most recent echo to be faxed over to them.   Fax: [phone]

## 2024-02-26 NOTE — Telephone Encounter (Signed)
Sent via EP[facility] fax

## 2024-02-27 ENCOUNTER — Ambulatory Visit (INDEPENDENT_AMBULATORY_CARE_PROVIDER_SITE_OTHER): Payer: Medicare HMO

## 2024-02-27 DIAGNOSIS — I442 Atrioventricular block, complete: Secondary | ICD-10-CM

## 2024-02-27 LAB — CUP PACEART REMOTE DEVICE CHECK
Battery Remaining Longevity: 136 mo
Battery Voltage: 3.03 V
Brady Statistic AP VP Percent: 72.39 %
Brady Statistic AP VS Percent: 0 %
Brady Statistic AS VP Percent: 27.57 %
Brady Statistic AS VS Percent: 0.04 %
Brady Statistic RA Percent Paced: 72.32 %
Brady Statistic RV Percent Paced: 99.96 %
Date Time Interrogation Session: 20250529231936
Implantable Lead Connection Status: 753985
Implantable Lead Connection Status: 753985
Implantable Lead Implant Date: 20240223
Implantable Lead Implant Date: 20240223
Implantable Lead Location: 753859
Implantable Lead Location: 753860
Implantable Lead Model: 3830
Implantable Lead Model: 5076
Implantable Pulse Generator Implant Date: 20240223
Lead Channel Impedance Value: 361 Ohm
Lead Channel Impedance Value: 437 Ohm
Lead Channel Impedance Value: 551 Ohm
Lead Channel Impedance Value: 589 Ohm
Lead Channel Pacing Threshold Amplitude: 0.875 V
Lead Channel Pacing Threshold Amplitude: 1 V
Lead Channel Pacing Threshold Pulse Width: 0.4 ms
Lead Channel Pacing Threshold Pulse Width: 0.4 ms
Lead Channel Sensing Intrinsic Amplitude: 2.5 mV
Lead Channel Sensing Intrinsic Amplitude: 2.5 mV
Lead Channel Sensing Intrinsic Amplitude: 22 mV
Lead Channel Setting Pacing Amplitude: 2 V
Lead Channel Setting Pacing Amplitude: 2 V
Lead Channel Setting Pacing Pulse Width: 0.4 ms
Lead Channel Setting Sensing Sensitivity: 1.2 mV
Zone Setting Status: 755011
Zone Setting Status: 755011

## 2024-03-03 ENCOUNTER — Ambulatory Visit: Payer: Self-pay | Admitting: Cardiovascular Disease

## 2024-03-24 ENCOUNTER — Encounter: Payer: Self-pay | Admitting: *Deleted

## 2024-03-24 ENCOUNTER — Other Ambulatory Visit: Payer: Self-pay | Admitting: *Deleted

## 2024-03-24 NOTE — Patient Outreach (Signed)
 Complex Care Management   Visit Note  04/01/2024 updated noted for 03/24/24  Name:  [name] [name] MRN: [id number] DOB: April 18, 1941  Situation: Referral received for Complex Care Management related to Heart Failure and COPD I obtained verbal consent from Patient.  Visit completed with Rojelio DELENA [name]  on the phone  Patient reports she is doing better   Weight management 182 lb today,  her goal is 180,  was 235 lb   durable medical equipment (DME) Went to [employer] (medical supply store for custom breast forms) via her insurance company & will return in every 6 months  ECHO review still pending   Had front teeth x 4 teeth (roots) removed. Process of healing Trinidad PA seen 03/18/24  Voiced she is interested getting hearing aids  Background:   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   Mitral valve stenosis    Morbid obesity (HCC)    Muscle pain    Osteopenia after menopause 07/11/2017   Polyposis associated with heterozygous mutation in MUTYH gene 01/03/2022   Sinus problem    Swelling    Tubular adenoma of colon 01/03/2022   Multiple, MUTYH mutation    Assessment: Patient Reported Symptoms:  Cognitive Cognitive Status: Alert and oriented to person, place, and time, Insightful and able to interpret abstract concepts, Normal speech and language skills      Neurological Neurological Review of Symptoms: No symptoms reported Oher Neurological Symptoms/Conditions [RPT]:  diabetic neuropathy Neurological Management Strategies: Medication therapy, Adequate rest Neurological Self-Management Outcome: 4 (good)  HEENT HEENT Symptoms Reported: Mouth or teeth pain, Other: HEENT Management Strategies: Medication therapy, Adequate rest HEENT Self-Management Outcome: 3 (uncertain) HEENT Comment: Recent removal of 4 front teeth from the root Interested in hearing aids for ongoing hearing concerns Denies GERD & allergies as the cause of spasm & closing of her esophagus at intervals Dentist in [facility] [place] Has not seen GI. Can eat spicy foods with issues Tried to get dentures without success Tooth problem(s), Ear problem(s)  Cardiovascular Cardiovascular Symptoms Reported: Swelling in legs or feet Does patient have uncontrolled Hypertension?: No Cardiovascular Management Strategies: Medication therapy, Adequate rest, Weight management Do You Have a Working Readable Scale?: Yes Weight: 182 lb (82.6 kg) Cardiovascular Self-Management Outcome: 4 (good)  Respiratory Respiratory Symptoms Reported: Productive cough, Other: Other Respiratory Symptoms: has increased mucus in her throat post nasal drainage that makes it hard for patient to speech, needing to clear her throat frequently Additional Respiratory Details: sinsus problems Respiratory Management Strategies: Adequate rest, Routine screening, Oxygen  therapy, Medication therapy, Breathing techniques, Breathing exercise, Spacer/mask Respiratory Self-Management Outcome: 3 (uncertain)  Endocrine Endocrine Symptoms Reported: No symptoms reported Is patient diabetic?: Yes Is patient checking blood sugars at home?: No Endocrine Self-Management Outcome: 4 (good)  Gastrointestinal Gastrointestinal Symptoms Reported: No symptoms reported Gastrointestinal Self-Management Outcome: 4 (good) Nutrition Risk Screen (CP): No indicators present  Genitourinary Genitourinary Symptoms Reported: No  symptoms reported Genitourinary  Self-Management Outcome: 4 (good)  Integumentary Integumentary Symptoms Reported: No symptoms reported Skin Self-Management Outcome: 4 (good)  Musculoskeletal Musculoskelatal Symptoms Reviewed: No symptoms reported Musculoskeletal Self-Management Outcome: 4 (good) Falls in the past year?: No Number of falls in past year: 1 or less Was there an injury with Fall?: No Fall Risk Category Calculator: 0 Patient Fall Risk Level: Low Fall Risk Patient at Risk for Falls Due to: Impaired balance/gait, Impaired mobility Fall risk Follow up: Falls evaluation completed, Falls prevention discussed  Psychosocial Psychosocial Symptoms Reported: No symptoms reported Behavioral Management Strategies: Activity, Adequate rest, Community resources, Personnel officer Health Self-Management Outcome: 4 (good) Behavioral Health Comment: Voices interest in getting a partner & increasing her socialization, but at this time has family concerns Major Change/Loss/Stressor/Fears (CP): Medical condition, self, Medical condition, family Techniques to [place] with Loss/Stress/Change: Diversional activities Quality of Family Relationships: helpful, supportive Do you feel physically threatened by others?: No      03/24/2024    3:08 PM  Depression screen PHQ 2/9  Decreased Interest 1  Down, Depressed, Hopeless 0  PHQ - 2 Score 1    There were no vitals filed for this visit.  Medications Reviewed Today   Medications were not reviewed in this encounter     Recommendation:   PCP Follow-up Continue Current Plan of Care  Follow Up Plan:   Telephone follow up appointment date/time:  04/29/24  Suzen L. Ramonita, RN, BSN, CCM [place]  [facility], [facility] RN Care Manager Direct Dial: [phone]  Fax: [phone]

## 2024-04-01 NOTE — Patient Instructions (Signed)
 Visit Information  Thank you for taking time to visit with me today. Please don't hesitate to contact me if I can be of assistance to you before our next scheduled appointment.  Your next care management appointment is by telephone on 04/29/24 at 3 pm  Please call if anything is needed prior to the scheduled appointment  Please call the care guide team at [phone] if you need to cancel, schedule, or reschedule an appointment.   Please call the Suicide and Crisis Lifeline: 988 call the [place]  National Suicide Prevention Lifeline: (319)744-3330 or TTY: 224-826-5303 TTY 620-834-6929) to talk to a trained counselor call 1-800-273-TALK (toll free, 24 hour hotline) call 911 if you are experiencing a Mental Health or Behavioral Health Crisis or need someone to talk to.  Eriberto Felch L. Ramonita, RN, BSN, CCM   [facility], [facility] RN Care Manager Direct Dial: [phone]  Fax: [phone]

## 2024-04-06 ENCOUNTER — Telehealth: Payer: Self-pay

## 2024-04-06 NOTE — Telephone Encounter (Signed)
   Pre-operative Risk Assessment    Patient Name: [name] [name]  DOB: [birth year] MRN: [id number]   Date of last office visit: 02/24/24 Date of next office visit: N/A   Request for Surgical Clearance    Procedure:  colonoscopy and EGD Dilation  Date of Surgery:  Clearance TBD                                Surgeon:   Surgeon's Group or Practice Name:  [facility] Phone number:  [phone] Fax number:  [phone]   Type of Clearance Requested:   - Pharmacy:  Hold Aspirin 5 days prior to procedure   Type of Anesthesia:  Not Indicated   Additional requests/questions:    [name]   04/06/2024, 2:17 PM

## 2024-04-07 ENCOUNTER — Telehealth: Payer: Self-pay

## 2024-04-07 NOTE — Telephone Encounter (Signed)
 Med Rec and Consent done    Patient Consent for Virtual Visit        [name] [name] has provided verbal consent on 04/07/2024 for a virtual visit (video or telephone).   CONSENT FOR VIRTUAL VISIT FOR:  [name] [name]  By participating in this virtual visit I agree to the following:  I hereby voluntarily request, consent and authorize [facility] and its employed or contracted physicians, physician assistants, nurse practitioners or other licensed health care professionals (the Practitioner), to provide me with telemedicine health care services (the "Services) as deemed necessary by the treating Practitioner. I acknowledge and consent to receive the Services by the Practitioner via telemedicine. I understand that the telemedicine visit will involve communicating with the Practitioner through live audiovisual communication technology and the disclosure of certain medical information by electronic transmission. I acknowledge that I have been given the opportunity to request an in-person assessment or other available alternative prior to the telemedicine visit and am voluntarily participating in the telemedicine visit.  I understand that I have the right to withhold or withdraw my consent to the use of telemedicine in the course of my care at any time, without affecting my right to future care or treatment, and that the Practitioner or I may terminate the telemedicine visit at any time. I understand that I have the right to inspect all information obtained and/or recorded in the course of the telemedicine visit and may receive copies of available information for a reasonable fee.  I understand that some of the potential risks of receiving the Services via telemedicine include:  Delay or interruption in medical evaluation due to technological equipment failure or disruption; Information transmitted may not be sufficient (e.g. poor resolution of images) to allow for  appropriate medical decision making by the Practitioner; and/or  In rare instances, security protocols could fail, causing a breach of personal health information.  Furthermore, I acknowledge that it is my responsibility to provide information about my medical history, conditions and care that is complete and accurate to the best of my ability. I acknowledge that Practitioner's advice, recommendations, and/or decision may be based on factors not within their control, such as incomplete or inaccurate data provided by me or distortions of diagnostic images or specimens that may result from electronic transmissions. I understand that the practice of medicine is not an exact science and that Practitioner makes no warranties or guarantees regarding treatment outcomes. I acknowledge that a copy of this consent can be made available to me via my patient portal [facility] MyChart), or I can request a printed copy by calling the office of [facility].    I understand that my insurance will be billed for this visit.   I have read or had this consent read to me. I understand the contents of this consent, which adequately explains the benefits and risks of the Services being provided via telemedicine.  I have been provided ample opportunity to ask questions regarding this consent and the Services and have had my questions answered to my satisfaction. I give my informed consent for the services to be provided through the use of telemedicine in my medical care

## 2024-04-07 NOTE — Telephone Encounter (Signed)
   Name: [name] [name]  DOB: April 09, 1941  MRN: [id number]  Primary Cardiologist: [name], MD   Preoperative team, please contact this patient and set up a phone call appointment for further preoperative risk assessment. Please obtain consent and complete medication review. Thank you for your help. Last seen by Delon Hoover 01/08/2024, and Dr. Nancey on 02/24/2024 (EP).   I confirm that guidance regarding antiplatelet and oral anticoagulation therapy has been completed and, if necessary, noted below.  Per office protocol, if patient is without any new symptoms or concerns at the time of their virtual visit, she may hold ASA for 7 days prior to procedure. Please resume ASA as soon as possible postprocedure, at the discretion of the surgeon.    I also confirmed the patient resides in the state of [place] . As per [facility] Medical Board telemedicine laws, the patient must reside in the state in which the provider is licensed.   Lamarr Satterfield, NP 04/07/2024, 8:20 AM [facility]

## 2024-04-07 NOTE — Telephone Encounter (Signed)
 S/W pt and scheduled TELE Preop appt for 04/13/24. Med Rec and Consent done

## 2024-04-07 NOTE — Telephone Encounter (Signed)
 Left message for pt to call our office and ask for the preop team to schedule TELE Preop appt.

## 2024-04-13 ENCOUNTER — Ambulatory Visit: Attending: Cardiology

## 2024-04-13 DIAGNOSIS — Z01810 Encounter for preprocedural cardiovascular examination: Secondary | ICD-10-CM | POA: Diagnosis not present

## 2024-04-13 NOTE — Progress Notes (Signed)
 Virtual Visit via Telephone Note   Because of [name] [name] co-morbid illnesses, she is at least at moderate risk for complications without adequate follow up.  This format is felt to be most appropriate for this patient at this time.  Due to technical limitations with video connection (technology), today's appointment will be conducted as an audio only telehealth visit, and [name] [name] verbally agreed to proceed in this manner.   All issues noted in this document were discussed and addressed.  No physical exam could be performed with this format.  Evaluation Performed:  Preoperative cardiovascular risk assessment _____________   Date:  04/13/2024   Patient ID:  [name] [name], DOB 25-Jun-1941, MRN [id number] Patient Location:  Home Provider location:   Office  Primary Care Provider:  [name], MD Primary Cardiologist:  [name], MD  Chief Complaint / Patient Profile  83 y.o. y/o female with a h/o aortic stenosis, hypertension, CKD, complete heart block s/p PPM, HFpEF, mitral valve stenosis, COPD, type 2 diabetes mellitus, hypothyroidism, dyslipidemia, history of breast cancer who is pending colonoscopy and EGD dilation and presents today for telephonic preoperative cardiovascular risk assessment. History of Present Illness  [name] [name] is a 83 y.o. female who presents via audio/video conferencing for a telehealth visit today.  Pt was last seen in cardiology clinic on 02/24/2024 by Dr. Nancey.  At that time [name] [name] was doing well.  The patient is now pending procedure as outlined above. Since her last visit, she has remained stable from a cardiac standpoint. Today she denies chest pain, shortness of breath, lower extremity edema, fatigue, palpitations, melena, hematuria, hemoptysis, diaphoresis, weakness, presyncope, syncope, orthopnea, and PND. She is able to achieve greater than 4 METs of activity. She  reports she attended chair aerobics today at the local senior center, tolerated well.  Past Medical History    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   Mitral valve stenosis    Morbid obesity (HCC)    Muscle pain    Osteopenia after menopause 07/11/2017   Polyposis associated with heterozygous mutation in MUTYH gene 01/03/2022   Sinus problem    Swelling    Tubular adenoma of colon 01/03/2022   Multiple, MUTYH mutation   Past Surgical History:  Procedure Laterality Date   BACK SURGERY  02/14/2014   MASTECTOMY Right 2015   PACEMAKER IMPLANT N/A 11/22/2022   Procedure: PACEMAKER IMPLANT;  Surgeon: Nancey Eulas BRAVO, MD;  Location: [facility] INVASIVE [facility];  Service: Cardiovascular;  Laterality: N/A;   PARTIAL HYSTERECTOMY  1980   TONSILLECTOMY     TOTAL KNEE ARTHROPLASTY Right 02/07/2011   Allergies Allergies  Allergen Reactions   Darvon [Propoxyphene] Swelling   Penicillins Swelling   Home Medications    Prior to Admission medications   Medication Sig Start Date End Date Taking? Authorizing Provider  Acetaminophen  (TYLENOL  8 HOUR ARTHRITIS PAIN  PO) Take 1-2 tablets by mouth as needed for pain.    [provider]  albuterol  (VENTOLIN  HFA) 108 (90 Base) MCG/ACT inhaler Inhale 2 puffs into the lungs every 6 (six) hours as needed for wheezing or shortness of breath. 07/20/20   [provider]  aspirin 81 MG EC tablet Take 81 mg by mouth daily. Swallow whole.    [provider]   MYRTICE 5-2.5-18.5 LF-MCG/0.5 injection  01/22/24   [provider]  calcium  carbonate (OSCAL) 1500 (600 Ca) MG TABS tablet Take 1 tablet by mouth daily with breakfast.    [provider]  FARXIGA  10 MG TABS tablet Take 10 mg by mouth daily. 10/25/21   [provider]  fluticasone  (FLONASE ) 50 MCG/ACT nasal spray Place 1-2 sprays into both nostrils as needed for allergies or rhinitis.    [provider]  Fluticasone -Umeclidin-Vilant (TRELEGY ELLIPTA) 100-62.5-25 MCG/ACT AEPB Inhale 1 puff into the lungs daily.    [provider]  furosemide  (LASIX ) 40 MG tablet Take 40 mg by mouth daily.    [provider]  gabapentin  (NEURONTIN ) 100 MG capsule Take 100 mg by mouth 2 (two) times daily.    [provider]  gabapentin  (NEURONTIN ) 300 MG capsule Take 300 mg by mouth 2 (two) times daily. 01/08/24   [provider]  hydrocortisone cream 1 % Apply 1 application  topically daily as needed for itching.    [provider]  ibuprofen (ADVIL) 600 MG tablet Take 600 mg by mouth every 6 (six) hours as needed. 03/03/24   [provider]  levothyroxine  (SYNTHROID ) 100 MCG tablet Take 100 mcg by mouth daily before breakfast.    [provider]  Magnesium 250 MG TABS Take 250 mg by mouth daily.    [provider]  MAGNESIUM COMPLEX HIGH POTENCY 250 MG CAPS  01/01/24   [provider]  Magnesium Oxide -Mg Supplement 250 MG TABS  01/08/24   [provider]  metolazone (ZAROXOLYN) 2.5 MG tablet Take 2.5 mg by mouth daily. 01/22/24   [provider]  nystatin  (MYCOSTATIN /NYSTOP ) powder Apply 1 Application topically 3 (three) times daily. 01/09/24   [name], [name] A, PA-C  potassium chloride  (KLOR-CON ) 10 MEQ tablet Take 1 tablet (10 mEq total) by mouth daily. 07/18/22   [name] Redell PARAS, MD  potassium chloride  (MICRO-K ) 10 MEQ CR capsule Take 10 mEq by mouth daily. 01/08/24   [provider]  rosuvastatin  (CRESTOR ) 10 MG tablet Take 1 tablet (10 mg total) by mouth daily. 01/10/23   [name] Redell PARAS, MD  triamcinolone cream (KENALOG) 0.1 % Apply 1 Application topically 2 (two) times daily. 01/22/24   [provider]    Physical Exam  Vital Signs:  [name] [name] does not have vital signs available for review today. Given telephonic nature of communication, physical exam is limited. AAOx3. NAD. Normal affect.  Speech and respirations are unlabored. Accessory Clinical Findings  None Assessment & Plan    1.  Preoperative Cardiovascular Risk Assessment: [name] [name] perioperative risk of a major cardiac event is 0.9% according to the Revised Cardiac Risk Index (RCRI).  Therefore, she is at low risk for perioperative complications.   Her functional capacity is good at 6.67 METs according to the Duke Activity Status Index (DASI). Recommendations: According to ACC/AHA guidelines, no further cardiovascular testing needed.  The patient may proceed to surgery at acceptable risk.   Antiplatelet and/or Anticoagulation Recommendations: Aspirin can be held for 5  days prior to her surgery.  Please resume Aspirin post operatively when it is felt to be safe from a bleeding standpoint.   The patient was advised that if she develops new symptoms prior to surgery to contact our office to arrange for a follow-up visit, and she verbalized understanding. A copy of this note will be routed to requesting surgeon.  Time:   Today, I have spent 10 minutes with the patient with telehealth technology discussing medical history, symptoms, and management plan.    [name] Weisenburger D Sindi Beckworth, NP  04/13/2024, 3:54 PM

## 2024-04-14 NOTE — Progress Notes (Signed)
 Remote pacemaker transmission.

## 2024-04-29 ENCOUNTER — Other Ambulatory Visit: Payer: Self-pay | Admitting: *Deleted

## 2024-04-29 ENCOUNTER — Encounter: Payer: Self-pay | Admitting: *Deleted

## 2024-04-29 NOTE — Patient Outreach (Signed)
 Duplicate encounter error in opening this new encounter  Dhairya Corales L. Ramonita, RN, BSN, CCM [place]  [facility], [facility] RN Care Manager Direct Dial: [phone]  Fax: [phone]

## 2024-05-07 ENCOUNTER — Telehealth: Payer: Self-pay

## 2024-05-07 NOTE — Progress Notes (Signed)
 Complex Care Management Care Guide Note  05/07/2024 Name: [name] [name] MRN: [id number] DOB: 1941-06-24  [name] [name] is a 83 y.o. year old female who is a primary care patient of [name], MD and is actively engaged with the care management team. I reached out to [name] [name] by phone today to assist with re-scheduling  with the RN Case Manager.  Follow up plan: Unsuccessful telephone outreach attempt made. A HIPAA compliant phone message was left for the patient providing contact information and requesting a return call.  [name] [facility], [facility] Guide  Direct Dial: [phone]  Fax [phone]

## 2024-05-10 NOTE — Progress Notes (Signed)
 Complex Care Management Care Guide Note  05/10/2024 Name: [name] [name] MRN: [id number] DOB: 1941/09/30  [name] [name] is a 83 y.o. year old female who is a primary care patient of [name], MD and is actively engaged with the care management team. I reached out to [name] [name] by phone today to assist with re-scheduling  with the RN Case Manager.  Follow up plan: Unsuccessful telephone outreach attempt made. A HIPAA compliant phone message was left for the patient providing contact information and requesting a return call.  [name] [facility], [facility] Guide  Direct Dial: [phone]  Fax [phone]

## 2024-05-17 ENCOUNTER — Encounter: Payer: Self-pay | Admitting: *Deleted

## 2024-05-19 ENCOUNTER — Telehealth: Payer: Self-pay

## 2024-05-19 NOTE — Progress Notes (Signed)
 Complex Care Management Care Guide Note  05/19/2024 Name: [name] [name] MRN: [id number] DOB: 03/29/1941  [name] [name] is a 83 y.o. year old female who is a primary care patient of [name], MD and is actively engaged with the care management team. I reached out to [name] [name] by phone today to assist with re-scheduling  with the RN Case Manager.  Follow up plan: Unsuccessful telephone outreach attempt made. A HIPAA compliant phone message was left for the patient providing contact information and requesting a return call.  [name] [facility], [facility] Guide  Direct Dial: [phone]  Fax [phone]

## 2024-05-28 ENCOUNTER — Ambulatory Visit (INDEPENDENT_AMBULATORY_CARE_PROVIDER_SITE_OTHER): Payer: Self-pay

## 2024-05-28 DIAGNOSIS — I442 Atrioventricular block, complete: Secondary | ICD-10-CM | POA: Diagnosis not present

## 2024-05-29 LAB — CUP PACEART REMOTE DEVICE CHECK
Battery Remaining Longevity: 123 mo
Battery Voltage: 3.03 V
Brady Statistic AP VP Percent: 47.77 %
Brady Statistic AP VS Percent: 0 %
Brady Statistic AS VP Percent: 52.21 %
Brady Statistic AS VS Percent: 0.02 %
Brady Statistic RA Percent Paced: 47.72 %
Brady Statistic RV Percent Paced: 99.98 %
Date Time Interrogation Session: 20250828200456
Implantable Lead Connection Status: 753985
Implantable Lead Connection Status: 753985
Implantable Lead Implant Date: 20240223
Implantable Lead Implant Date: 20240223
Implantable Lead Location: 753859
Implantable Lead Location: 753860
Implantable Lead Model: 3830
Implantable Lead Model: 5076
Implantable Pulse Generator Implant Date: 20240223
Lead Channel Impedance Value: 399 Ohm
Lead Channel Impedance Value: 437 Ohm
Lead Channel Impedance Value: 589 Ohm
Lead Channel Impedance Value: 608 Ohm
Lead Channel Pacing Threshold Amplitude: 1 V
Lead Channel Pacing Threshold Amplitude: 1 V
Lead Channel Pacing Threshold Pulse Width: 0.4 ms
Lead Channel Pacing Threshold Pulse Width: 0.4 ms
Lead Channel Sensing Intrinsic Amplitude: 22 mV
Lead Channel Sensing Intrinsic Amplitude: 3.25 mV
Lead Channel Sensing Intrinsic Amplitude: 3.25 mV
Lead Channel Setting Pacing Amplitude: 2.25 V
Lead Channel Setting Pacing Amplitude: 2.25 V
Lead Channel Setting Pacing Pulse Width: 0.4 ms
Lead Channel Setting Sensing Sensitivity: 1.2 mV
Zone Setting Status: 755011
Zone Setting Status: 755011

## 2024-06-03 ENCOUNTER — Ambulatory Visit: Payer: Self-pay | Admitting: Cardiovascular Disease

## 2024-06-07 NOTE — Progress Notes (Signed)
 Remote PPM Transmission

## 2024-07-08 ENCOUNTER — Ambulatory Visit: Admitting: Cardiology

## 2024-07-08 NOTE — Progress Notes (Deleted)
 Cardiology Office Note:    Date:  07/08/2024   ID:  [name] [name], [place] 07/08/1941, MRN [id number]  PCP:  [name], MD  Cardiologist:  [name], MD    Referring MD: [name], MD    ASSESSMENT:    1. Aortic valve stenosis, moderate   2. Nonrheumatic mitral valve stenosis   3. Hypertensive heart disease with heart failure (HCC)   4. Heart block AV complete (HCC)   5. Pacemaker   6. Stage 3 chronic kidney disease, unspecified whether stage 3a or 3b CKD (HCC)    PLAN:    In order of problems listed above:  ***   Next appointment: ***   Medication Adjustments/Labs and Tests Ordered: Current medicines are reviewed at length with the patient today.  Concerns regarding medicines are outlined above.  No orders of the defined types were placed in this encounter.  No orders of the defined types were placed in this encounter.    History of Present Illness:    [name] [name] is a 83 y.o. female with a hx of moderate aortic stenosis calcific mitral valve disease moderate stenosis mild agitation hypertensive heart disease with heart failure stage III CKD bifascicular heart with pacemaker and COPD last seen 01/10/2023. Following the visit she had an echocardiogram performed 08/08/1999 show any aortic stenosis which was described as moderate to severe moderate mitral stenosis and moderate mitral regurgitation normal ejection fraction moderate LVH and reduced GLS.  Reviewing the echo report SVI was normal peak and mean gradients were 56 and 32 mmHg valve area 0.9 cm valve mean gradient 6 mmHg 3 artery systolic pressure was normal  Compliance with diet, lifestyle and medications: *** Past Medical History:  Diagnosis Date   Aortic valve stenosis, moderate    Arthritis    Asthma    Benign hypertension with CKD (chronic kidney disease) stage III (HCC)    COPD (chronic obstructive pulmonary disease) (HCC)    Diabetic peripheral neuropathy (HCC)     Diastolic congestive heart failure (HCC)    DM type 2 with diabetic mixed hyperlipidemia (HCC)    Hearing loss    History of breast cancer    HLD (hyperlipidemia)    HTN (hypertension)    Hypothyroid    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      EKGs/Labs/Other Studies Reviewed:    The following studies were reviewed today:  Cardiac Studies & Procedures   ______________________________________________________________________________________________     ECHOCARDIOGRAM  ECHOCARDIOGRAM COMPLETE 08/08/2023  Narrative ECHOCARDIOGRAM REPORT    Patient Name:   [name] [name] Date of Exam: 08/08/2023 Medical Rec #:  [id number]                    Height:       63.0 in Accession #:    [id number]                   Weight:       196.6 lb Date of Birth:  Dec 09, 1940                   BSA:          1.920 m Patient Age:    83 years                     BP:           102/66 mmHg Patient Gender: F                            HR:           78 bpm. Exam Location:  [place]  Procedure: 2D Echo, Cardiac Doppler, Color Doppler and Strain Analysis  Indications:    Aortic valve stenosis, moderate [I35.0 (ICD-10-CM)]; Nonrheumatic mitral valve stenosis [I34.2 (ICD-10-CM)]; Heart block AV complete (HCC) [I44.2 (ICD-10-CM)]; Pacemaker [Z95.0 (ICD-10-CM)]; Hypertensive heart disease with heart failure (HCC) [I11.0 (ICD-10-CM)]  History:        Patient has prior history of Echocardiogram examinations, most recent 11/21/2022. CHF, Pacemaker, Aortic Valve  Disease and Mitral Valve Disease; Risk Factors:Hypertension.  Sonographer:    Charlie Jointer RDCS Referring Phys: [id number] Jatziry Wechter J Kendrah Lovern  IMPRESSIONS   1. Left ventricular ejection fraction, by estimation, is 60 to 65%. The left ventricle has normal function. The left ventricle has no regional wall motion abnormalities. There is moderate left ventricular hypertrophy. Left ventricular diastolic parameters are consistent with Grade I diastolic dysfunction (impaired relaxation).GLS-11.8% 2. Right ventricular systolic function is normal. The right ventricular size is normal. There is normal pulmonary artery systolic pressure. 3. Left atrial size was severely dilated. 4. The mitral valve was not well visualized. Mild mitral valve regurgitation. Moderate mitral stenosis. 5. The aortic valve was not well visualized. Aortic valve regurgitation is not visualized. Moderate to severe aortic valve stenosis. 6. The inferior vena cava is normal in size with greater than 50% respiratory variability, suggesting right atrial pressure of 3 mmHg.  FINDINGS Left Ventricle: Left ventricular ejection fraction, by estimation, is 60 to 65%. The left ventricle has normal function. The left ventricle has no regional wall motion abnormalities. The left ventricular internal cavity size was normal in size. There is moderate left ventricular hypertrophy. Left ventricular diastolic parameters are consistent with Grade I diastolic dysfunction (impaired relaxation).  Right Ventricle: The right ventricular size is normal. No increase in right ventricular wall thickness. Right ventricular systolic function is normal. There is normal pulmonary artery systolic pressure. The tricuspid regurgitant velocity is 2.52 m/s, and with an assumed right atrial pressure of 3 mmHg, the estimated right ventricular systolic pressure is 28.4 mmHg.  Left Atrium: Left atrial size was severely dilated.  Right Atrium: Right atrial size was  normal in size.  Pericardium: There is no evidence of pericardial effusion.  Mitral Valve: The mitral valve was not well visualized. Mild mitral valve regurgitation. Moderate mitral valve stenosis. MV peak gradient, 15.2 mmHg. The mean mitral valve gradient is 6.0 mmHg.  Tricuspid Valve: The tricuspid valve is normal in structure. Tricuspid valve regurgitation is not demonstrated. No evidence of tricuspid stenosis.  Aortic Valve: The aortic valve was not well visualized. Aortic valve regurgitation is not visualized. Moderate to severe aortic stenosis is present. Aortic valve mean gradient measures 31.8 mmHg. Aortic valve peak gradient measures 56.0 mmHg. Aortic valve area, by VTI measures 0.90 cm.  Pulmonic Valve: The pulmonic valve was normal in structure. Pulmonic valve regurgitation is not visualized. No evidence  of pulmonic stenosis.  Aorta: The aortic root is normal in size and structure.  Venous: The inferior vena cava is normal in size with greater than 50% respiratory variability, suggesting right atrial pressure of 3 mmHg.  IAS/Shunts: No atrial level shunt detected by color flow Doppler.   LEFT VENTRICLE PLAX 2D LVIDd:         3.50 cm   Diastology LVIDs:         2.60 cm   LV e' medial:    3.26 cm/s LV PW:         1.40 cm   LV E/e' medial:  38.0 LV IVS:        1.40 cm   LV e' lateral:   6.31 cm/s LVOT diam:     1.80 cm   LV E/e' lateral: 19.7 LV SV:         75 LV SV Index:   39 LVOT Area:     2.54 cm   RIGHT VENTRICLE             IVC RV Basal diam:  2.90 cm     IVC diam: 1.40 cm RV Mid diam:    2.70 cm RV S prime:     10.60 cm/s TAPSE (M-mode): 1.6 cm  LEFT ATRIUM             Index        RIGHT ATRIUM           Index LA diam:        4.50 cm 2.34 cm/m   RA Area:     11.40 cm LA Vol (A2C):   97.2 ml 50.64 ml/m  RA Volume:   21.20 ml  11.04 ml/m LA Vol (A4C):   92.7 ml 48.29 ml/m LA Biplane Vol: 95.9 ml 49.96 ml/m AORTIC VALVE AV Area (Vmax):    0.90 cm AV  Area (Vmean):   0.87 cm AV Area (VTI):     0.90 cm AV Vmax:           374.00 cm/s AV Vmean:          263.750 cm/s AV VTI:            0.838 m AV Peak Grad:      56.0 mmHg AV Mean Grad:      31.8 mmHg LVOT Vmax:         131.67 cm/s LVOT Vmean:        90.300 cm/s LVOT VTI:          0.296 m LVOT/AV VTI ratio: 0.35  AORTA Ao Root diam: 3.10 cm Ao Asc diam:  3.10 cm Ao Desc diam: 2.60 cm  MITRAL VALVE                TRICUSPID VALVE MV Area (PHT): 1.44 cm     TR Peak grad:   25.4 mmHg MV Area VTI:   1.25 cm     TR Vmax:        252.00 cm/s MV Peak grad:  15.2 mmHg MV Mean grad:  6.0 mmHg     SHUNTS MV Vmax:       1.95 m/s     Systemic VTI:  0.30 m MV Vmean:      116.0 cm/s   Systemic Diam: 1.80 cm MV Decel Time: 525 msec MV E velocity: 124.00 cm/s MV A velocity: 170.00 cm/s MV E/A ratio:  0.73  Jennifer Crape MD Electronically signed by Jennifer Crape MD Signature  Date/Time: 08/09/2023/4:42:29 PM    Final          ______________________________________________________________________________________________          Recent Labs: No results found for requested labs within last 365 days.  Recent Lipid Panel No results found for: CHOL, TRIG, HDL, CHOLHDL, VLDL, LDLCALC, LDLDIRECT  Physical Exam:    VS:  There were no vitals taken for this visit.    Wt Readings from Last 3 Encounters:  03/24/24 182 lb (82.6 kg)  02/24/24 195 lb 6.4 oz (88.6 kg)  02/11/24 195 lb (88.5 kg)     GEN: *** Well nourished, well developed in no acute distress HEENT: Normal NECK: No JVD; No carotid bruits LYMPHATICS: No lymphadenopathy CARDIAC: ***RRR, no murmurs, rubs, gallops RESPIRATORY:  Clear to auscultation without rales, wheezing or rhonchi  ABDOMEN: Soft, non-tender, non-distended MUSCULOSKELETAL:  No edema; No deformity  SKIN: Warm and dry NEUROLOGIC:  Alert and oriented x 3 PSYCHIATRIC:  Normal affect    Signed, [name], MD  07/08/2024 7:55 AM     [facility]

## 2024-07-19 NOTE — Progress Notes (Unsigned)
 Cardiology Office Note:    Date:  07/20/2024   ID:  [name] [name], [name] [birth year], MRN [id number]  PCP:  [name], MD  Cardiologist:  [name], MD    Referring MD: [name], MD    ASSESSMENT:    1. Aortic valve stenosis, moderate   2. Nonrheumatic mitral valve stenosis   3. Nonrheumatic mitral valve regurgitation   4. Hypertensive heart disease with heart failure (HCC)   5. Benign hypertension with CKD (chronic kidney disease) stage III (HCC)   6. Complete heart block (HCC)   7. Pacemaker     PLAN:    In order of problems listed above:  Fortunately [name] [name]'s valvular heart disease is relatively stable aortic stenosis remains moderate with mild progression and mitral stenosis nonrheumatic is moderate without pulmonary hypertension She is asymptomatic for now close clinical observation to symptoms and repeat echocardiogram in 9 months before next visit Heart failure is very well compensated she has no edema she is not short of breath kidney function if anything is improved and she will continue her SGLT2 inhibitor along with loop diuretic and thiazide diuretic. Stable kidney disease Doing well after pacemaker she is 100% ventricularly paced on her last device check followed in our device clinic   Next appointment: I will see her in 9 months echocardiogram before the visit and she has good follow-up with her PCP with labs checked in that office.   Medication Adjustments/Labs and Tests Ordered: Current medicines are reviewed at length with the patient today.  Concerns regarding medicines are outlined above.  No orders of the defined types were placed in this encounter.  No orders of the defined types were placed in this encounter.    History of Present Illness:    [name] [name] is a 83 y.o. female with a hx of moderate aortic stenosis calcific mitral valve disease moderate stenosis mild agitation hypertensive heart disease with  heart failure stage III CKD bifascicular heart with pacemaker and COPD last seen 01/10/2023. Following the visit she had an echocardiogram performed 08/08/2023 show any aortic stenosis which was described as moderate to severe moderate mitral stenosis and moderate mitral regurgitation normal ejection fraction moderate LVH and reduced GLS.  Reviewing the echo report SVI was normal peak and mean gradients were 56 and 32 mmHg valve area 0.9 cm valve mean MV gradient 6 mmHg with normal pulmonary artery peak systolic pressure.  Previous aortic valve gradients 41 and 27 mmHg with a mean mitral valve gradient 6 mmHg and also normal pulmonary artery pressure.  Recent labs are quite reassuring creatinine 1.16 GFR 48 cc/min stage IIIa CKD normal sodium 132 normal potassium 3.9 cholesterol 123 with an LDL of 61 non-HDL cholesterol 79 A1c 5.8% all these parameters are at target She feels so much better since pacemaker insertion and tells me she has not had shortness of breath edema chest pain palp potation or syncope She did does note that the skin is quite thin over pacemaker and to avoid irritation from the bra strap asked her to put a soft handkerchief or a shoulder pad underneath her bra strap.  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   albuterol  (VENTOLIN  HFA) 108 (90 Base) MCG/ACT inhaler Inhale 2 puffs into the lungs every 6 (six) hours as needed for wheezing or shortness of breath.   aspirin 81 MG EC tablet Take 81 mg by mouth daily. Swallow whole.   calcium  carbonate (OSCAL) 1500 (600 Ca) MG TABS tablet Take 1 tablet by mouth daily with breakfast.   FARXIGA  10 MG TABS tablet Take 10 mg by mouth daily.   fluticasone  (FLONASE ) 50 MCG/ACT nasal spray Place 1-2 sprays into both nostrils as needed for allergies or rhinitis.   Fluticasone -Umeclidin-Vilant (TRELEGY ELLIPTA) 100-62.5-25 MCG/ACT AEPB Inhale 1 puff into the lungs daily.   furosemide  (LASIX ) 40 MG tablet Take 40 mg by mouth daily.   GABAPENTIN  PO Take 40 mg by mouth 2 (two) times daily.   levothyroxine  (SYNTHROID ) 100 MCG tablet Take 100 mcg by mouth daily before breakfast.   Magnesium 250 MG TABS Take 250 mg by mouth daily.   Magnesium Oxide -Mg Supplement 250 MG TABS    metolazone (ZAROXOLYN) 2.5 MG tablet Take 2.5 mg by mouth daily.   nystatin  (MYCOSTATIN /NYSTOP ) powder Apply 1 Application topically 3 (three) times daily.   pantoprazole (PROTONIX) 40 MG tablet Take 40 mg by mouth every morning.   potassium chloride  (KLOR-CON ) 10 MEQ tablet Take 1 tablet (10 mEq total) by mouth daily.   rosuvastatin  (CRESTOR ) 10 MG tablet Take 1 tablet (10 mg total) by mouth daily.      EKGs/Labs/Other Studies Reviewed:    The following studies were reviewed today:        Physical Exam:    VS:  BP 108/64   Pulse 72   Ht 5' 2.5 (1.588 m)   Wt 181 lb 6.4 oz (82.3 kg)   SpO2 95%   BMI 32.65 kg/m     Wt Readings from Last 3 Encounters:  07/20/24 181 lb 6.4 oz (82.3 kg)  03/24/24 182 lb  (82.6 kg)  02/24/24 195 lb 6.4 oz (88.6 kg)     GEN:  Well nourished, well developed in no acute distress HEENT: Normal NECK: No JVD; No carotid bruits LYMPHATICS: No lymphadenopathy CARDIAC: She has a grade 2/6 systolic ejection murmur S2 is split but soft does not radiate to the carotids RRR,  RESPIRATORY:  Clear to auscultation without rales, wheezing or rhonchi  ABDOMEN: Soft, non-tender, non-distended MUSCULOSKELETAL:  No edema; No deformity  SKIN: Warm and dry NEUROLOGIC:  Alert and oriented x 3 PSYCHIATRIC:  Normal affect    Signed, [name], MD  07/20/2024 3:41 PM    [facility]

## 2024-07-20 ENCOUNTER — Ambulatory Visit: Attending: Cardiology | Admitting: Cardiology

## 2024-07-20 ENCOUNTER — Encounter: Payer: Self-pay | Admitting: Cardiology

## 2024-07-20 VITALS — BP 108/64 | HR 72 | Ht 62.5 in | Wt 181.4 lb

## 2024-07-20 DIAGNOSIS — I342 Nonrheumatic mitral (valve) stenosis: Secondary | ICD-10-CM

## 2024-07-20 DIAGNOSIS — I129 Hypertensive chronic kidney disease with stage 1 through stage 4 chronic kidney disease, or unspecified chronic kidney disease: Secondary | ICD-10-CM

## 2024-07-20 DIAGNOSIS — I350 Nonrheumatic aortic (valve) stenosis: Secondary | ICD-10-CM

## 2024-07-20 DIAGNOSIS — Z950 Presence of cardiac pacemaker: Secondary | ICD-10-CM

## 2024-07-20 DIAGNOSIS — I442 Atrioventricular block, complete: Secondary | ICD-10-CM

## 2024-07-20 DIAGNOSIS — I110 Hypertensive heart disease with heart failure: Secondary | ICD-10-CM

## 2024-07-20 DIAGNOSIS — I340 Nonrheumatic mitral (valve) insufficiency: Secondary | ICD-10-CM | POA: Diagnosis not present

## 2024-07-20 DIAGNOSIS — N183 Chronic kidney disease, stage 3 unspecified: Secondary | ICD-10-CM

## 2024-07-20 NOTE — Patient Instructions (Signed)
 Put a handkerchief or shoulder pad under your bra strap to prevent rubbing on your pacemaker.  Medication Instructions:  Your physician recommends that you continue on your current medications as directed. Please refer to the Current Medication list given to you today.  *If you need a refill on your cardiac medications before your next appointment, please call your pharmacy*   Lab Work: None ordered If you have labs (blood work) drawn today and your tests are completely normal, you will receive your results only by: MyChart Message (if you have MyChart) OR A paper copy in the mail If you have any lab test that is abnormal or we need to change your treatment, we will call you to review the results.  Testing/Procedures: Your physician has requested that you have an echocardiogram 9 months prior to your next visit. Echocardiography is a painless test that uses sound waves to create images of your heart. It provides your doctor with information about the size and shape of your heart and how well your heart's chambers and valves are working. This procedure takes approximately one hour. There are no restrictions for this procedure. Please do NOT wear cologne, perfume, aftershave, or lotions (deodorant is allowed). Please arrive 15 minutes prior to your appointment time.  Please note: We ask at that you not bring children with you during ultrasound (echo/ vascular) testing. Due to room size and safety concerns, children are not allowed in the ultrasound rooms during exams. Our front office staff cannot provide observation of children in our lobby area while testing is being conducted. An adult accompanying a patient to their appointment will only be allowed in the ultrasound room at the discretion of the ultrasound technician under special circumstances. We apologize for any inconvenience.  Follow-Up: At [facility], you and your health needs are our priority.  As part of our continuing mission to  provide you with exceptional heart care, we have created designated Provider Care Teams.  These Care Teams include your primary Cardiologist (physician) and Advanced Practice Providers (APPs -  Physician Assistants and Nurse Practitioners) who all work together to provide you with the care you need, when you need it.  We recommend signing up for the patient portal called MyChart.  Sign up information is provided on this After Visit Summary.  MyChart is used to connect with patients for Virtual Visits (Telemedicine).  Patients are able to view lab/test results, encounter notes, upcoming appointments, etc.  Non-urgent messages can be sent to your provider as well.   To learn more about what you can do with MyChart, go to [email or web address].    Your next appointment:   9 month(s)  The format for your next appointment:   In Person  Provider:   [name], MD   Other Instructions Echocardiogram An echocardiogram is a test that uses sound waves (ultrasound) to produce images of the heart. Images from an echocardiogram can provide important information about: Heart size and shape. The size and thickness and movement of your heart's walls. Heart muscle function and strength. Heart valve function or if you have stenosis. Stenosis is when the heart valves are too narrow. If blood is flowing backward through the heart valves (regurgitation). A tumor or infectious growth around the heart valves. Areas of heart muscle that are not working well because of poor blood flow or injury from a heart attack. Aneurysm detection. An aneurysm is a weak or damaged part of an artery wall. The wall bulges out from the normal force  of blood pumping through the body. Tell a health care provider about: Any allergies you have. All medicines you are taking, including vitamins, herbs, eye drops, creams, and over-the-counter medicines. Any blood disorders you have. Any surgeries you have had. Any medical  conditions you have. Whether you are pregnant or may be pregnant. What are the risks? Generally, this is a safe test. However, problems may occur, including an allergic reaction to dye (contrast) that may be used during the test. What happens before the test? No specific preparation is needed. You may eat and drink normally. What happens during the test? You will take off your clothes from the waist up and put on a hospital gown. Electrodes or electrocardiogram (ECG)patches may be placed on your chest. The electrodes or patches are then connected to a device that monitors your heart rate and rhythm. You will lie down on a table for an ultrasound exam. A gel will be applied to your chest to help sound waves pass through your skin. A handheld device, called a transducer, will be pressed against your chest and moved over your heart. The transducer produces sound waves that travel to your heart and bounce back (or echo back) to the transducer. These sound waves will be captured in real-time and changed into images of your heart that can be viewed on a video monitor. The images will be recorded on a computer and reviewed by your health care provider. You may be asked to change positions or hold your breath for a short time. This makes it easier to get different views or better views of your heart. In some cases, you may receive contrast through an IV in one of your veins. This can improve the quality of the pictures from your heart. The procedure may vary among health care providers and hospitals.   What can I expect after the test? You may return to your normal, everyday life, including diet, activities, and medicines, unless your health care provider tells you not to do that. Follow these instructions at home: It is up to you to get the results of your test. Ask your health care provider, or the department that is doing the test, when your results will be ready. Keep all follow-up visits. This is  important. Summary An echocardiogram is a test that uses sound waves (ultrasound) to produce images of the heart. Images from an echocardiogram can provide important information about the size and shape of your heart, heart muscle function, heart valve function, and other possible heart problems. You do not need to do anything to prepare before this test. You may eat and drink normally. After the echocardiogram is completed, you may return to your normal, everyday life, unless your health care provider tells you not to do that. This information is not intended to replace advice given to you by your health care provider. Make sure you discuss any questions you have with your health care provider. Document Revised: 05/09/2020 Document Reviewed: 05/09/2020 Elsevier Patient Education  2021 [employer]   Important Information About Sugar

## 2024-08-30 ENCOUNTER — Ambulatory Visit: Payer: Self-pay

## 2024-08-30 DIAGNOSIS — I442 Atrioventricular block, complete: Secondary | ICD-10-CM | POA: Diagnosis not present

## 2024-08-31 LAB — CUP PACEART REMOTE DEVICE CHECK
Battery Remaining Longevity: 123 mo
Battery Voltage: 3.02 V
Brady Statistic AP VP Percent: 30 %
Brady Statistic AP VS Percent: 0 %
Brady Statistic AS VP Percent: 69.96 %
Brady Statistic AS VS Percent: 0.03 %
Brady Statistic RA Percent Paced: 29.95 %
Brady Statistic RV Percent Paced: 99.97 %
Date Time Interrogation Session: 20251130183728
Implantable Lead Connection Status: 753985
Implantable Lead Connection Status: 753985
Implantable Lead Implant Date: 20240223
Implantable Lead Implant Date: 20240223
Implantable Lead Location: 753859
Implantable Lead Location: 753860
Implantable Lead Model: 3830
Implantable Lead Model: 5076
Implantable Pulse Generator Implant Date: 20240223
Lead Channel Impedance Value: 323 Ohm
Lead Channel Impedance Value: 418 Ohm
Lead Channel Impedance Value: 494 Ohm
Lead Channel Impedance Value: 551 Ohm
Lead Channel Pacing Threshold Amplitude: 0.875 V
Lead Channel Pacing Threshold Amplitude: 1 V
Lead Channel Pacing Threshold Pulse Width: 0.4 ms
Lead Channel Pacing Threshold Pulse Width: 0.4 ms
Lead Channel Sensing Intrinsic Amplitude: 22 mV
Lead Channel Sensing Intrinsic Amplitude: 3 mV
Lead Channel Sensing Intrinsic Amplitude: 3 mV
Lead Channel Setting Pacing Amplitude: 2 V
Lead Channel Setting Pacing Amplitude: 2.5 V
Lead Channel Setting Pacing Pulse Width: 0.4 ms
Lead Channel Setting Sensing Sensitivity: 1.2 mV
Zone Setting Status: 755011
Zone Setting Status: 755011

## 2024-09-02 ENCOUNTER — Ambulatory Visit: Payer: Self-pay | Admitting: Cardiovascular Disease

## 2024-09-03 NOTE — Progress Notes (Signed)
 Remote PPM Transmission

## 2024-10-19 ENCOUNTER — Other Ambulatory Visit: Payer: Self-pay

## 2024-10-19 DIAGNOSIS — I350 Nonrheumatic aortic (valve) stenosis: Secondary | ICD-10-CM

## 2024-11-16 ENCOUNTER — Ambulatory Visit: Admitting: Allergy

## 2024-11-29 ENCOUNTER — Ambulatory Visit

## 2025-01-10 ENCOUNTER — Encounter: Admitting: Hematology and Oncology

## 2025-02-28 ENCOUNTER — Ambulatory Visit

## 2025-05-30 ENCOUNTER — Ambulatory Visit

## 2025-08-29 ENCOUNTER — Ambulatory Visit
